# Patient Record
Sex: Female | Born: 1940
Health system: Southern US, Community
[De-identification: ages and names within clinical notes are randomized; demographics above are authoritative.]

## PROBLEM LIST (undated history)

## (undated) DIAGNOSIS — K219 Gastro-esophageal reflux disease without esophagitis: Secondary | ICD-10-CM

## (undated) DIAGNOSIS — I701 Atherosclerosis of renal artery: Secondary | ICD-10-CM

## (undated) DIAGNOSIS — E785 Hyperlipidemia, unspecified: Secondary | ICD-10-CM

## (undated) DIAGNOSIS — E119 Type 2 diabetes mellitus without complications: Secondary | ICD-10-CM

## (undated) DIAGNOSIS — Z951 Presence of aortocoronary bypass graft: Secondary | ICD-10-CM

## (undated) DIAGNOSIS — I10 Essential (primary) hypertension: Secondary | ICD-10-CM

## (undated) DIAGNOSIS — I2510 Atherosclerotic heart disease of native coronary artery without angina pectoris: Secondary | ICD-10-CM

## (undated) HISTORY — DX: Atherosclerotic heart disease of native coronary artery without angina pectoris: I25.10

## (undated) HISTORY — DX: Presence of aortocoronary bypass graft: Z95.1

## (undated) HISTORY — DX: Atherosclerosis of renal artery: I70.1

## (undated) HISTORY — PX: TOTAL KNEE ARTHROPLASTY: SHX125

## (undated) HISTORY — DX: Essential (primary) hypertension: I10

## (undated) HISTORY — DX: Type 2 diabetes mellitus without complications: E11.9

## (undated) HISTORY — DX: Hyperlipidemia, unspecified: E78.5

---

## 1997-06-18 DIAGNOSIS — Z951 Presence of aortocoronary bypass graft: Secondary | ICD-10-CM

## 1997-06-18 HISTORY — DX: Presence of aortocoronary bypass graft: Z95.1

## 1998-05-18 ENCOUNTER — Inpatient Hospital Stay (HOSPITAL_COMMUNITY): Admission: AD | Admit: 1998-05-18 | Discharge: 1998-05-24 | Payer: Self-pay | Admitting: Cardiovascular Disease

## 1998-05-18 ENCOUNTER — Encounter: Payer: Self-pay | Admitting: Cardiovascular Disease

## 1998-05-18 HISTORY — PX: CARDIAC CATHETERIZATION: SHX172

## 1998-05-19 ENCOUNTER — Encounter: Payer: Self-pay | Admitting: Cardiovascular Disease

## 1998-05-19 HISTORY — PX: CORONARY ARTERY BYPASS GRAFT: SHX141

## 1998-05-20 ENCOUNTER — Encounter: Payer: Self-pay | Admitting: Cardiovascular Disease

## 1998-05-21 ENCOUNTER — Encounter: Payer: Self-pay | Admitting: Thoracic Surgery (Cardiothoracic Vascular Surgery)

## 1998-05-26 ENCOUNTER — Encounter: Payer: Self-pay | Admitting: Thoracic Surgery (Cardiothoracic Vascular Surgery)

## 1998-05-26 ENCOUNTER — Inpatient Hospital Stay (HOSPITAL_COMMUNITY)
Admission: EM | Admit: 1998-05-26 | Discharge: 1998-05-30 | Payer: Self-pay | Admitting: Thoracic Surgery (Cardiothoracic Vascular Surgery)

## 2001-08-27 ENCOUNTER — Encounter: Payer: Self-pay | Admitting: Unknown Physician Specialty

## 2001-08-27 ENCOUNTER — Encounter: Admission: RE | Admit: 2001-08-27 | Discharge: 2001-08-27 | Payer: Self-pay | Admitting: Unknown Physician Specialty

## 2003-03-02 HISTORY — PX: CARDIAC CATHETERIZATION: SHX172

## 2004-06-05 ENCOUNTER — Ambulatory Visit: Payer: Self-pay | Admitting: Family Medicine

## 2004-09-14 ENCOUNTER — Ambulatory Visit: Payer: Self-pay | Admitting: Family Medicine

## 2004-10-23 ENCOUNTER — Ambulatory Visit: Payer: Self-pay | Admitting: Family Medicine

## 2004-12-20 ENCOUNTER — Ambulatory Visit: Payer: Self-pay | Admitting: Family Medicine

## 2005-02-05 ENCOUNTER — Ambulatory Visit: Payer: Self-pay | Admitting: Family Medicine

## 2005-02-20 ENCOUNTER — Ambulatory Visit: Payer: Self-pay | Admitting: Family Medicine

## 2005-03-15 ENCOUNTER — Encounter (INDEPENDENT_AMBULATORY_CARE_PROVIDER_SITE_OTHER): Payer: Self-pay | Admitting: Orthopaedic Surgery

## 2005-03-15 ENCOUNTER — Ambulatory Visit (HOSPITAL_COMMUNITY): Admission: RE | Admit: 2005-03-15 | Discharge: 2005-03-15 | Payer: Self-pay | Admitting: Orthopaedic Surgery

## 2005-05-15 ENCOUNTER — Ambulatory Visit: Payer: Self-pay | Admitting: Family Medicine

## 2005-08-22 ENCOUNTER — Ambulatory Visit: Payer: Self-pay | Admitting: Family Medicine

## 2005-12-18 ENCOUNTER — Ambulatory Visit: Payer: Self-pay | Admitting: Family Medicine

## 2006-01-16 ENCOUNTER — Ambulatory Visit: Payer: Self-pay | Admitting: Family Medicine

## 2006-03-18 HISTORY — PX: OTHER SURGICAL HISTORY: SHX169

## 2006-04-25 ENCOUNTER — Ambulatory Visit: Payer: Self-pay | Admitting: Family Medicine

## 2006-05-02 ENCOUNTER — Ambulatory Visit: Payer: Self-pay | Admitting: Family Medicine

## 2006-05-16 ENCOUNTER — Ambulatory Visit: Payer: Self-pay | Admitting: Family Medicine

## 2006-06-03 ENCOUNTER — Ambulatory Visit: Payer: Self-pay | Admitting: Family Medicine

## 2006-08-09 ENCOUNTER — Ambulatory Visit: Payer: Self-pay | Admitting: Family Medicine

## 2006-09-05 ENCOUNTER — Ambulatory Visit (HOSPITAL_COMMUNITY): Admission: RE | Admit: 2006-09-05 | Discharge: 2006-09-05 | Payer: Self-pay | Admitting: Ophthalmology

## 2006-09-10 ENCOUNTER — Ambulatory Visit: Payer: Self-pay | Admitting: Family Medicine

## 2006-12-12 ENCOUNTER — Ambulatory Visit (HOSPITAL_COMMUNITY): Admission: RE | Admit: 2006-12-12 | Discharge: 2006-12-12 | Payer: Self-pay | Admitting: Ophthalmology

## 2007-04-17 HISTORY — PX: CARDIAC CATHETERIZATION: SHX172

## 2007-11-06 ENCOUNTER — Encounter: Payer: Self-pay | Admitting: Orthopedic Surgery

## 2007-11-06 ENCOUNTER — Inpatient Hospital Stay (HOSPITAL_COMMUNITY): Admission: RE | Admit: 2007-11-06 | Discharge: 2007-11-10 | Payer: Self-pay | Admitting: Orthopaedic Surgery

## 2007-11-14 ENCOUNTER — Telehealth: Payer: Self-pay | Admitting: Orthopedic Surgery

## 2007-11-20 ENCOUNTER — Ambulatory Visit: Payer: Self-pay | Admitting: Orthopedic Surgery

## 2007-11-20 DIAGNOSIS — Z96659 Presence of unspecified artificial knee joint: Secondary | ICD-10-CM

## 2007-11-24 ENCOUNTER — Encounter: Payer: Self-pay | Admitting: Orthopedic Surgery

## 2009-07-14 ENCOUNTER — Ambulatory Visit (HOSPITAL_COMMUNITY): Admission: RE | Admit: 2009-07-14 | Discharge: 2009-07-14 | Payer: Self-pay | Admitting: Family Medicine

## 2010-05-12 ENCOUNTER — Ambulatory Visit (HOSPITAL_COMMUNITY): Admission: RE | Admit: 2010-05-12 | Discharge: 2010-05-12 | Payer: Self-pay | Admitting: Orthopaedic Surgery

## 2010-07-19 ENCOUNTER — Encounter: Payer: Self-pay | Admitting: Neurosurgery

## 2010-10-31 NOTE — Consult Note (Signed)
Virginia [name], Virginia [name]               ACCOUNT NO.:  [id number]   MEDICAL RECORD NO.:  [id number]          PATIENT TYPE:  INP   LOCATION:  A329                          FACILITY:  [facility]   PHYSICIAN:  Osvaldo Shipper, MD     DATE OF BIRTH:  07/14/1940   DATE OF CONSULTATION:  11/06/2007  DATE OF DISCHARGE:                                 CONSULTATION   Please note the patient's PMD is Dr. Lysbeth Galas and she is followed by  [facility].   PHYSICIAN REQUESTING CONSULTATION:  J. Darreld Mclean, M.D.   REASON FOR CONSULTATION:  Management of diabetes, hypertension.   CHIEF COMPLAINT:  Right knee pain.   HISTORY OF PRESENT ILLNESS:  The patient is a 70 year old Caucasian  female who has a past medical history of coronary artery disease status  post CABG, history of type 2 diabetes and hypertension who underwent an  elective right total knee replacement earlier today.  The patient has  been having pain in both her knees actually for many years.  She  underwent I think arthroscopic procedure in 2006 of the right knee and  she underwent a right partial medial meniscectomy as well.  However, her  symptoms did not improve and so this surgery was planned.  At this point  she denies any shortness of breath or chest pain.  She says that ever  since her CABG in 1999 she has not had any issues with her heart as  well.  So her main concern at this point is her right knee pain.   HOME MEDICATIONS:  The patient did not know the names of her medications  but from her med rec sheet it appears that she is on the following:  1. Caduet 10/30 one tablet daily.  2. Actos 45 mg daily.  3. Benazepril/HCTZ 20/12.5 daily.  4. Paroxetine 10 mg daily.  5. Metoprolol 50 mg daily.  6. Glyburide/metformin 1.25/250 daily.  7. Micardis 40 mg daily.  8. Diclofenac 75 mg daily.   ALLERGIES:  No known drug allergies.   PAST MEDICAL HISTORY:  Positive for CABG in 1999 for coronary artery  disease, history of  cataract surgery, knee surgeries, diabetes type 2,  hypertension.   SOCIAL HISTORY:  Lives in [place] with her daughter.  No smoking,  alcohol or illicit drug use.  She uses a cane and at times a walker to  ambulate.   FAMILY HISTORY:  She has a son who underwent an aortic valve replacement  for aortic stenosis.   REVIEW OF SYSTEMS:  Ten point review of systems was negative with some  limitations because the patient was groggy from her narcotics.   PHYSICAL EXAMINATION:  VITAL SIGNS:  Temperature 98.0, heart rate 83,  respiratory rate 20, blood pressure 145/71, continuous pulse oximetry  shows oximetry to be 95%.  GENERAL:  This is an obese white female complaining of pain but in no  distress.  HEENT:  There is no pallor, no icterus.  Oral mucous membranes moist.  No oral lesions are noted.  NECK:  Soft and supple.  No thyromegaly is appreciated.  LUNGS:  Clear to auscultation with few actually very few scattered  wheezes bilaterally.  CARDIOVASCULAR:  S1 and S2 normal, regular.  No murmurs appreciated.  ABDOMEN:  Abdomen is soft, nontender, nondistended.  CARDIOVASCULAR:  Cardiac exam was unremarkable.  EXTREMITIES:  Show mild edema in the left lower extremity.  The right  leg was not examined at this time.  NEUROLOGICAL:  The patient is alert but somnolent, easily arousable and  oriented x3.   LABS:  No labs done today.  Labs done on May 15 show normal white count,  hemoglobin slightly low at 11.3, rest of the labs completely  unremarkable.   ASSESSMENT:  This is a 70 year old Caucasian female who underwent right  total knee replacement.  She has a history of coronary artery disease,  hypertension, diabetes.  She appears medically stable at this time.  She  is having pain in her right knee which is being addressed with a PCA  pump.  Her medical issues appear to be stable.   PLAN:  Continue with current treatment.  Continue with the beta-blocker  as you are doing already.   It should be noted that she was seen by  [facility] prior to this surgery and was cleared.  She had  a cardiac cath I believe sometime within the last 6 months or so back in  October and it showed patent grafts.  So at this point we should  continue what we are doing without any changes.  She is getting DVT  prophylaxis.  I will also recommend incentive spirometry.  Recommend  CBGs to be checked q. a.c. and at bedtime, put her on a moderate sliding  scale.  We will also hold off on glyburide for now until her p.o. intake  is consistent to avoid hypoglycemia.   We would like to thank Dr. Hilda Lias for asking [place] to consult on this  patient.  We would be happy to follow this patient along with him while  she is hospitalized.      Osvaldo Shipper, MD  Electronically Signed     GK/MEDQ  D:  11/06/2007  T:  11/06/2007  Job:  [id number]   cc:   Delaney Meigs, M.D.  Fax: [phone]   J. Darreld Mclean, M.D.  Fax: [phone]   Nanetta Batty, M.D.  Fax: [phone]

## 2010-10-31 NOTE — Op Note (Signed)
Virginia [name], Virginia [name]               ACCOUNT NO.:  [id number]   MEDICAL RECORD NO.:  [id number]          PATIENT TYPE:  INP   LOCATION:  A329                          FACILITY:  [facility]   PHYSICIAN:  J. Darreld Mclean, M.D. DATE OF BIRTH:  04/22/1941   DATE OF PROCEDURE:  DATE OF DISCHARGE:                               OPERATIVE REPORT   PREOPERATIVE DIAGNOSIS:  Severe degenerative joint disease of the right  knee.   POSTOPERATIVE DIAGNOSIS:  Severe degenerative joint disease of the right  knee.   PROCEDURE:  Right knee arthroplasty using a Smith & Nephew #5 femoral  component, a #4 tibial component, a  #11 tibial wafer, and a 29-mm  patella button, all cemented with methyl methacrylate.   ANESTHESIA:  Spinal.   SURGEON:  Teola Bradley, MD   TOURNIQUET TIME:  87 minutes.   DRAIN:  One large drain used.   INDICATIONS:  The patient is a 70 year old female with severe  degenerative joint disease of the right knee that has gotten  progressively worse over the last several years.  She did have an  arthroscopy several years ago and did well initially, but this has  gotten to increasing pain and tenderness of her knee.  She requires  assisted device to ambulate, has pain at rest of.  Discussed with her  total knee arthroplasty several occasions.  I have gone over the risks  and imponderables of the procedure with her on multiple occasions  including infection, blood clot that can cause death, possible need for  blood transfusion, need for physical therapy postoperatively either at  home or through a nursing home, and anesthesia risk.  She has elected to  give blood, autotransfusion via the [facility] and has completed 2 units.  She was informed of the total joint classes here at the hospital.   DESCRIPTION OF PROCEDURE:  The patient was seen in the holding area and  the right knee was identified as the correct surgical site.  I placed a  mark on the right knee.  The patient brought  to the operating room and  given spinal anesthesia.  She was then placed supine on the operating  room table with a tourniquet placed and deflated right upper thigh.  A  leg positioning support was placed.  The patient then prepped and draped  in the usual manner.   I had a time-out identifying [name] as the patient, the right knee  as the correct surgical site and we are doing a total knee arthroplasty.   The patient's leg was elevated, wrapped circumferentially with an  Esmarch bandage.  Tourniquet inflated to 375 mmHg.  The Esmarch bandage  removed.  Midline incision was made.  Parapatellar incision was made.  The patella was everted.  Excess synovium and debris of the knee was  removed sharply.  The knee was flexed with the patella everted.  First,  drill hole was made and then the first jig was placed.  I elected to  take 2 mm extra off of the proximal femur and this was done and the jig  was then reapplied and held in place with pins.  Cuts were made.  The  femur was sized and it was more likely four and three-quarters so I  elected to go up to a size 5. Next, a jig block was placed and an  anterior cut was made, a posterior cut was made, a posterior chamfer cut  was made and an anterior chamfer cut was made.  This jig block was  removed.  Attention was directed to the tibia and tibia was fully  exposed and external alignment guide was used.  I elected to take 11 mm  off the high side which was the left lateral. An alignment rod was used  and was properly aligned to the second toe.  Cuts were made.  Proximal  tibia bone was removed. The distal tibia component was placed and the  spacing gap was measured and it fit very nicely at 9 but was slightly  loose, at 11 it fit very nicely.  A #5 tibial component was a little bit  and I elected to use a #4 tibial component with an 11 spacer fit  excellent, had good range of motion.  The knee was stable, did not lift  up in either AP or  lateral reflection views and we tried a 13 that was a  little tight.  I elected to use a #4 tibial component.  Attention was  directed back to the femur.  Appropriate jig was used to make the box  cut in the distal portion of the femur.  Jig was placed and then using  the drill, cuts were made and a box osteotome was used.  This completed  the cuts for the distal femur.  Trial reduction was carried out.  We  found the place where the tibia fit best and we made marks.  The femoral  component was removed.  Tibial block was placed.  Pins were made and  then the tibial drill was used and then the tibial jig and box cut were  made with the impactor.  These were then removed.  Trial reduction was  carried out very nicely.  Attention was directed to the patella and  patella cuts were made.  The patella measured 21.5 mm.  We took off 9 mm  with the patella jig and the cutting saw reciprocator.  Drill holes were  made for the patella button.  The assistant Mr. McFadden mixed the glue  while I cleaned the femur and washed it out with a Waterpik lavage  system.  The glue was then placed.  A viscous tape with a caulk gun  first trimmed the distal component, the tibia was placed and this was  impacted.  Then the femoral component was placed and impacted.  The  tibial wafer was then inserted and fit nicely to #11 and then the  patella button with methacrylate applied, it was held in place until the  glue hardened.  Hemovac drain was placed.  All the components fit  nicely.  Flexion/extension was excellent.  Knee was stable.  The wound  was reapproximated using interrupted figure-of-eight  #1 Surgilon  suture.  Hemovac was placed on with 2-0 silk.  Tourniquet was deflated  after 87 minutes.  X-rays were taken which showed excellent position and  alignment of the prosthesis on AP and lateral views.  Hemostasis was  obtained and the tourniquet have been deflated and then the wound was  reapproximated using  2-0 plain, the subcutaneous tissue  in layers and  skin staples.  Sterile dressing applied and bulky dressing applied.  Cryo cuff would be applied in recovery.  She would be placed in a CPM in  recovery.  The patient is admitted.           ______________________________  [name], M.D.     JWK/MEDQ  D:  11/06/2007  T:  11/07/2007  Job:  [id number]   cc:   Delaney Meigs, M.D.  Fax: [phone]   Hospitalist

## 2010-10-31 NOTE — H&P (Signed)
NAME:  Virginia [name], Virginia [name]               ACCOUNT NO.:  [id number]   MEDICAL RECORD NO.:  [id number]          PATIENT TYPE:  AMB   LOCATION:  DAY                           FACILITY:  [facility]   PHYSICIAN:  J. Darreld Mclean, M.D. DATE OF BIRTH:  08-Jun-1941   DATE OF ADMISSION:  DATE OF DISCHARGE:  LH                              HISTORY & PHYSICAL   CHIEF COMPLAINT:  My right knee hurts.   HISTORY OF PRESENT ILLNESS:  The patient is a 70 year old female with  pain and tenderness in her right knee for some time.  I saw her in 2006  for the first time complaining of pain and tenderness in her right knee.  An MRI showed a tear of the medial meniscus of the right knee.  She  underwent arthroscopy of the knee.  It was noted at that time she had  marked severe degenerative joint disease, particular medially.  She  tolerated the procedure well.  The patient has a history of heart  disease and is being followed by [facility].  Over the last several years, has gotten increasing pain and tenderness  to the knee on the right.  Both knees are painful both knees are tender.  Her knees got progressively worse over time, and she has not responded  well to conservative treatment.  She has recurring swelling, pain, need  of assistive device to ambulate.  She is being seen by both Dr. Lysbeth Galas,  her family doctor, and Dr. Allyson Sabal her cardiac physician, have giving  clearance for her surgery.  I have talked to her on several occasions  about a total knee arthroplasty and gone over the risks and  imponderables in detail.  The patient elected to have total knee  arthroplasty on the right.  She has given blood through the [facility].   PAST HISTORY:  1. Positive for heart disease in 1999.  In December, she had open      heart surgery.  She has done well with that over the years.  2. History of hypertension.  3. History of diabetes.  4. She denies lung disease, kidney disease, stroke,  paralysis,      weakness, TB, cancer, ulcer disease or circulatory problems.   ALLERGIES:  SHE DENIES ANY ALLERGIES.   MEDICATIONS:  1. Paroxetine 10 mg daily.  2. Caduet 10 mg daily.  3. Mobic 15 mg daily.  4. Actos 45 mg daily.  5. Toprol XL 50 mg daily.  6. Combination of glyburide/metformin 125/250 b.i.d.  7. Vitamin C.  8. Aspirin 81 mg daily.   SOCIAL HISTORY:  She does not smoke or use alcoholic beverage.   PRIMARY CARE PHYSICIAN:  Delaney Meigs, M.D.   CARDIOLOGIST:  Nanetta Batty, M.D.   FAMILY HISTORY:  History of strokes run in the family.  Mother had a  stroke.  The patient lives in [place], and she is single.   PHYSICAL EXAMINATION:  VITAL SIGNS:  Normal.  HEENT:  Negative.  NECK:  Supple.  LUNGS:  Clear to P&A.  HEART:  Regular without murmur.  ABDOMEN:  Soft, nontender without masses.  Right knee has range of  motion 0-90 degrees with pain.  She can get to full extension with some  difficulty.  She has crepitus to the knee, deformity to the knee.  Knee  is stable.  She has no distal edema.  CNS: Intact.  SKIN:  Intact.  EXTREMITIES:  Negative   IMPRESSION:  Significant severe DJD of the right knee, mild DJD of the  left knee.   PLAN:  Total knee arthroplasty on the right.   The patient also has a significant history of cardiac disease, was found  to be an acceptable candidate for this procedure.   I have gone over with her the risks and the problems of the procedure  including infection, blood clots that could cause death, need for  physical therapy, continued passive motion machine, home health therapy  and spinal anesthesia.  She understands with blood transfusion and has  already given 2 units through the [facility].  Labs are pending                                            ______________________________  J. Darreld Mclean, M.D.     JWK/MEDQ  D:  11/05/2007  T:  11/05/2007  Job:  [id number]

## 2010-11-03 NOTE — H&P (Signed)
NAME:  Virginia [name], Virginia [name]               ACCOUNT NO.:  [id number]   MEDICAL RECORD NO.:  [id number]          PATIENT TYPE:  AMB   LOCATION:                                FACILITY:  [facility]   PHYSICIAN:  J. Darreld Mclean, M.D. DATE OF BIRTH:  [birth year]   DATE OF ADMISSION:  DATE OF DISCHARGE:  LH                                HISTORY & PHYSICAL   CHIEF COMPLAINT:  My knee hurts.   The patient is a 70 year old female with pain and tenderness in her right  knee.  I first saw her in the office on July 26 with pain and tenderness in  her knee, and I thought she has a meniscal injury.  The knee had been  bothering her for several months prior to that.  She had been seen by Dr.  Lysbeth Galas.  She has had giving way, swelling, tenderness.  Dr. Lysbeth Galas asked  that I see her.  An MRI was obtained of the knee which showed a medial  meniscus bucket-handle tear with displaced fragment, possible lateral  meniscus radial tear, and changes to the cruciate ligaments.  There was  effusion and DJD.   The patient was informed of the findings.  I had given her an injection when  I first saw her, so her knee was better at that time, and she did not want  to have any surgery done.  She had a previous history of open heart surgery  and was asked to a cardiologist for evaluation. She saw her cardiologist on  August 30, Dr. Allyson Sabal, who said she had a low cardiac risk and would be  comfortable for surgery, arthroscopy.  The patient now wants to have it done  because the knee keeps giving way, is swollen and painful with tenderness,  and she is not getting any better.   The patient has a history of heart disease, history of hypertension, history  of diabetes.   MEDICATIONS:  1.  Paroxetine 10 mg daily.  2.  Caduet 10 mg daily.  3.  Mobic 15 mg daily.  4.  Actos 45 mg daily.  5.  Avalide 300/12.5 mg daily.  6.  Toprol XL 50 mg 1-1/2 daily.  7.  Glyburide and metformin 1.25/250 one twice daily.  8.  Vitamin C 1  daily.  9.  Aspirin 81 mg daily.  10. Vicodin 5/500 one every 4 hours p.r.n. pain.   ALLERGIES:  The patient denies any allergies.   She does smoke.  Does not use alcoholic beverages.  She is a Recruitment consultant.  Dr. Lysbeth Galas is her family physician.   She is status post open heart surgery December 1999.  Heart disease and  strokes run in the family.   The patient is single, lives in [place].  She works for [employer].   PHYSICAL EXAMINATION:  VITAL SIGNS:  Blood pressure 184/90, pulse 60,  respirations 20, afebrile.  She is 5 feet 3 inches, 253 pounds.  GENERAL:  Alert and cooperative.  HEENT:  Negative.  NECK:  Supple.  LUNGS:  Clear to percussion and  auscultation.  HEART:  Regular without murmur heard.  ABDOMEN:  Soft and nontender without masses.  EXTREMITIES:  Right knee is painful, tender, with positive McMurray medial  aspect. Other extremities within normal limits.  CNS:  Cranial nerves II-XII intact.   IMPRESSION:  Tear of meniscus, right knee, medial meniscus and possible tear  anterior meniscus.   PLAN:  Operative arthroscopy, partial medial meniscectomy.  Discussed with  patient planned procedure, appears to understand.  Labs are pending.                                            ______________________________  J. Darreld Mclean, M.D.     JWK/MEDQ  D:  03/07/2005  T:  03/07/2005  Job:  [id number]

## 2010-11-03 NOTE — Discharge Summary (Signed)
NAMEFRITZI, [name]               ACCOUNT NO.:  [id number]   MEDICAL RECORD NO.:  [id number]          PATIENT TYPE:  INP   LOCATION:  A329                          FACILITY:  [facility]   PHYSICIAN:  J. Darreld [place] [name], M.D. DATE OF BIRTH:  1940-06-28   DATE OF ADMISSION:  11/06/2007  DATE OF DISCHARGE:  05/25/2009LH                               DISCHARGE SUMMARY   DISCHARGE DIAGNOSIS:  Severe degenerative joint disease of the right  knee.   PROCEDURE PERFORMED:  Total knee arthroplasty on the right using a Smith  & Nephew #5, femoral component #4, tibial component #11, tibial wave  from 29 mm __________.   DISCHARGE STATUS:  Improved.   PROGNOSIS:  Good.   DISPOSITION:  Home.   The patient is scheduled to be seen in my office in 1 month.   Discharge medications are listed on the discharge medical reconciliation  form __________ reference.  I also have her on beta Tylox one every 4  hours for pain and enoxaparin 30 mg daily for 14 days.  Home health and  physical therapy to arrange this.   PHYSICAL THERAPY INSTRUCTIONS:  Weightbearing as tolerated with the  right knee, stress extension of the knee, use of a walker.  Total knee  protocol.   WOUND CARE:  Nurses to remove the staples from the knee wound  approximately 12-14 days after surgery.  Call with any difficulty or  problems with the wound.   BRIEF HISTORY:  The patient has severe degenerative joint disease of the  right knee.  Risks and imponderables of the procedure were discussed on  several occasions prior to this admission.  The patient underwent above-  mentioned procedure and tolerated it well.  She was seen in consultation  by the hospitalist, Dr. Rito Ehrlich.  Pain was controlled by the PCA pump.  First day, vital signs were stable.  Hemoglobin was 10.3, therapy was  begun.  She did well with the therapy and made very good progress.  CPM  machine was in place.  Hemoglobin fell to 8.9, and she was transfused  with  auto-transfused blood.  We had arranged with her in the [facility].  Hemovac removed on second day and transfusions were begun.  On the  following day, the hemoglobin was 9.5.  Foley was discontinued and IV  was discontinued.  She was placed on p.o. medicine, and she did well.  Hemoglobin remained stable.  Her wounds  looked good.  A OpSite dressing was applied.  On Nov 10, 2007, plans  were made for discharge to her home with home health.  Physical therapy  would be done at home as well as the nurse visits.  Other labs were  normal, EKG was negative.  I will see her as stated.  If difficulties,  contact me through the office or hospital beeper system.     ______________________________  Shela Commons. Darreld [place] [name], M.D.    ______________________________  Shela Commons. Darreld [place] [name], M.D.    JWK/MEDQ  D:  11/27/2007  T:  11/27/2007  Job:  [id number]

## 2010-11-03 NOTE — Op Note (Signed)
NAME:  Virginia [name], Virginia [name]               ACCOUNT NO.:  [id number]   MEDICAL RECORD NO.:  [id number]          PATIENT TYPE:  AMB   LOCATION:  DAY                           FACILITY:  [facility]   PHYSICIAN:  J. Darreld Mclean, M.D. DATE OF BIRTH:  08-Apr-1941   DATE OF PROCEDURE:  03/15/2005  DATE OF DISCHARGE:                                 OPERATIVE REPORT   CHIEF COMPLAINT:  Right knee pain.   PREOPERATIVE DIAGNOSES:  Tear of medial meniscus, right knee. Questionable  tear, anterior horn lateral meniscus. Degenerative joint disease.   POSTOPERATIVE DIAGNOSES:  Tear of medial meniscus, right knee. Questionable  tear, anterior horn lateral meniscus. Degenerative joint disease.   PROCEDURE:  Operative arthroscopy of the right knee with right partial  medial meniscectomy.   ANESTHESIA:  General.   TOURNIQUET TIME:  46 minutes.   SURGEON:  Dr. Hilda Lias.   INDICATIONS:  The patient is a 70 year old female with pain and tenderness  in the right knee. MRI shows tear of the medial meniscus, degenerative joint  disease, and questionable tear of the anterior horn of the lateral meniscus.  The patient has not improved with conservative treatment. Risks and  imponderables of the procedure were discussed preoperatively. The patient  appeared to understand and agreed to the procedure.   OPERATIVE FINDINGS:  Suprapatellar pouch and degenerative changes. Synovitis  and grade 3 changes to the patella. Medially, there was grade 4 changes down  to eburnated bone. Several stellate tears to the medial meniscus. Meniscus  was very loose, particularly around the mid posterior horn. There was some  fragments within the knee. Anterior cruciate was intact. Laterally, there  was some grade 2 to 3 changes, small tear of the anterior horn but not  enough to remove it, just a very small frayed area.   DESCRIPTION OF PROCEDURE:  The patient was seen in the holding area. The  right knee was identified as the correct  surgical site. She placed a mark,  and I placed a mark on the right knee. She was brought back to the operating  room and given general anesthesia while supine. Leg holder and tourniquet  placed deflated right upper thigh. The patient prepped and draped in usual  manner. Had a time out and reidentified the patient as Virginia [name] and doing  a right knee arthroscopy. Leg was elevated, wrapped circumferentially with  Esmarch bandages, tourniquet inflated to 350 mmHg, Esmarch bandage removed.  Medial inflow cannula was inserted. Lactated ringers instilled into the knee  by an infusion pump. Arthroscope inserted laterally, the knee systematically  examined, please see findings above. Permanent pictures were taken.  Attention directed to the medial side. She had several large tears in the  meniscus. The meniscus was loose. There was some fragments, removed the  fragments, and using a meniscus punch and meniscus knife and grabbers  removed a large portion of the meniscus. Then using a laser and the meniscal  shaver, I was able to get a good smooth contour. This took a while because  there was so many fragments, and it was just not  one big piece; it was a lot  of little pieces. She has eburnated bone. She has significant degenerative  joint disease medially. Laterally, the knee was examined, some fraying, but  this was consistent all the way around it, and there was no discrete tear.  The knee was irrigated with the remaining part of lactated ringers. The  wound was reapproximated using 3-0 nylon interrupted vertical mattress  sutures and some staples. Tourniquet deflated 46 minutes. Sterile dressing  applied. One percent Marcaine had been applied into each portal prior to  letting down the tourniquet. Sterile dressing applied. Bulky dressing  applied. The patient tolerated the procedure well and went to recovery in  good condition. The patient had appropriate analgesia for pain. I will see  in the  office in approximately 10 days to 2 weeks. If any difficulties, she  is to contact me through the office or hospital beeper system.           ______________________________  [name], M.D.     JWK/MEDQ  D:  03/15/2005  T:  03/15/2005  Job:  [id number]

## 2011-03-14 LAB — URINALYSIS, ROUTINE W REFLEX MICROSCOPIC
Glucose, UA: NEGATIVE
Hgb urine dipstick: NEGATIVE
Protein, ur: NEGATIVE
pH: 6.5

## 2011-03-14 LAB — BASIC METABOLIC PANEL
BUN: 7
CO2: 33 — ABNORMAL HIGH
Calcium: 8.9
Chloride: 100
Creatinine, Ser: 0.6
GFR calc Af Amer: >60
GFR calc Af Amer: >60
GFR calc non Af Amer: >60
GFR calc non Af Amer: >60
GFR calc non Af Amer: >60
Glucose, Bld: 124 — ABNORMAL HIGH
Glucose, Bld: 167 — ABNORMAL HIGH
Potassium: 3.8
Potassium: 3.9
Potassium: 4.1
Sodium: 135
Sodium: 135
Sodium: 138
Sodium: 139

## 2011-03-14 LAB — COMPREHENSIVE METABOLIC PANEL
ALT: 20
AST: 20
Albumin: 3.7
CO2: 33 — ABNORMAL HIGH
Chloride: 102
GFR calc Af Amer: >60
GFR calc non Af Amer: >60
Potassium: 4.3
Sodium: 141
Total Bilirubin: 0.8

## 2011-03-14 LAB — DIFFERENTIAL
Basophils Absolute: 0
Basophils Absolute: 0
Basophils Absolute: 0
Basophils Relative: 0
Basophils Relative: 0
Eosinophils Absolute: 0.1
Eosinophils Absolute: 0.2
Eosinophils Absolute: 0.2
Eosinophils Relative: 1
Eosinophils Relative: 1
Eosinophils Relative: 2
Eosinophils Relative: 3
Lymphocytes Relative: 17
Lymphocytes Relative: 23
Lymphocytes Relative: 9 — ABNORMAL LOW
Lymphs Abs: 0.9
Lymphs Abs: 1.5
Lymphs Abs: 1.5
Monocytes Absolute: 0.6
Monocytes Absolute: 0.7
Monocytes Absolute: 1.2 — ABNORMAL HIGH
Monocytes Absolute: 1.4 — ABNORMAL HIGH
Monocytes Relative: 7
Neutro Abs: 9.2 — ABNORMAL HIGH
Neutrophils Relative %: 84 — ABNORMAL HIGH

## 2011-03-14 LAB — CBC
HCT: 25.9 — ABNORMAL LOW
HCT: 27.5 — ABNORMAL LOW
HCT: 29.5 — ABNORMAL LOW
HCT: 30.7 — ABNORMAL LOW
HCT: 32.6 — ABNORMAL LOW
Hemoglobin: 10.3 — ABNORMAL LOW
Hemoglobin: 10.6 — ABNORMAL LOW
Hemoglobin: 11.3 — ABNORMAL LOW
Hemoglobin: 8.9 — ABNORMAL LOW
Hemoglobin: 9.5 — ABNORMAL LOW
MCHC: 34.5
MCHC: 34.5
MCHC: 34.6
MCHC: 34.8
MCHC: 34.8
MCV: 89.1
MCV: 89.4
MCV: 90.2
MCV: 90.3
MCV: 91.2
Platelets: 163
Platelets: 173
Platelets: 204
Platelets: 222
Platelets: 232
RBC: 2.87 — ABNORMAL LOW
RBC: 3.08 — ABNORMAL LOW
RBC: 3.26 — ABNORMAL LOW
RBC: 3.45 — ABNORMAL LOW
RBC: 3.57 — ABNORMAL LOW
RDW: 13
RDW: 13.1
RDW: 13.2
RDW: 13.2
RDW: 13.5
WBC: 10.9 — ABNORMAL HIGH
WBC: 6.2
WBC: 6.8
WBC: 8.4
WBC: 9.1

## 2011-03-14 LAB — CROSSMATCH: Antibody Screen: NEGATIVE

## 2011-03-14 LAB — PROTIME-INR: Prothrombin Time: 13.1

## 2011-03-14 LAB — ABO/RH: ABO/RH(D): B POS

## 2011-04-04 LAB — BASIC METABOLIC PANEL
BUN: 12
CO2: 28
Calcium: 9.2
Chloride: 103
Creatinine, Ser: 0.68
GFR calc Af Amer: >60
GFR calc non Af Amer: >60
Glucose, Bld: 142 — ABNORMAL HIGH
Potassium: 4.4
Sodium: 140

## 2011-04-04 LAB — HEMOGLOBIN AND HEMATOCRIT, BLOOD
HCT: 37
Hemoglobin: 12.8

## 2011-04-19 HISTORY — PX: NM MYOCAR PERF WALL MOTION: HXRAD629

## 2012-09-16 HISTORY — PX: OTHER SURGICAL HISTORY: SHX169

## 2012-09-25 ENCOUNTER — Other Ambulatory Visit (HOSPITAL_COMMUNITY): Payer: Self-pay | Admitting: Cardiovascular Disease

## 2012-09-25 DIAGNOSIS — I10 Essential (primary) hypertension: Secondary | ICD-10-CM

## 2012-09-29 ENCOUNTER — Ambulatory Visit (HOSPITAL_COMMUNITY)
Admission: RE | Admit: 2012-09-29 | Discharge: 2012-09-29 | Disposition: A | Discharge status: Home / Self Care | Payer: Medicare Other | Source: Ambulatory Visit | Attending: Cardiovascular Disease | Admitting: Cardiovascular Disease

## 2012-09-29 DIAGNOSIS — I10 Essential (primary) hypertension: Secondary | ICD-10-CM | POA: Insufficient documentation

## 2012-09-29 NOTE — Progress Notes (Signed)
Renal artery duplex doppler was completed. Kleber Crean RVT 

## 2012-11-07 ENCOUNTER — Telehealth: Payer: Self-pay | Admitting: *Deleted

## 2012-11-07 DIAGNOSIS — I701 Atherosclerosis of renal artery: Secondary | ICD-10-CM

## 2012-11-07 NOTE — Telephone Encounter (Signed)
Message copied by Marella Bile on Fri Nov 07, 2012 11:50 AM ------      Message from: Runell Gess      Created: Wed Oct 29, 2012  7:47 PM       Mild Right RAS. Repeat 6 mo ------

## 2012-11-07 NOTE — Telephone Encounter (Signed)
Patient was notified of renal doppler results at office visit on  10/23/12.  Ordered repeat dopplers in 6 months

## 2013-04-02 ENCOUNTER — Telehealth (HOSPITAL_COMMUNITY): Payer: Self-pay | Admitting: *Deleted

## 2013-04-03 ENCOUNTER — Encounter: Payer: Self-pay | Admitting: Cardiovascular Disease

## 2013-04-10 ENCOUNTER — Other Ambulatory Visit (HOSPITAL_COMMUNITY): Payer: Self-pay | Admitting: Cardiovascular Disease

## 2013-04-10 DIAGNOSIS — E669 Obesity, unspecified: Secondary | ICD-10-CM

## 2013-04-17 ENCOUNTER — Ambulatory Visit (HOSPITAL_COMMUNITY)
Admission: RE | Admit: 2013-04-17 | Discharge: 2013-04-17 | Disposition: A | Discharge status: Home / Self Care | Payer: Medicare Other | Source: Ambulatory Visit | Attending: Internal Medicine | Admitting: Internal Medicine

## 2013-04-17 ENCOUNTER — Ambulatory Visit (HOSPITAL_BASED_OUTPATIENT_CLINIC_OR_DEPARTMENT_OTHER)
Admission: RE | Admit: 2013-04-17 | Discharge: 2013-04-17 | Disposition: A | Discharge status: Home / Self Care | Payer: Medicare Other | Source: Ambulatory Visit | Attending: Internal Medicine | Admitting: Internal Medicine

## 2013-04-17 DIAGNOSIS — Z951 Presence of aortocoronary bypass graft: Secondary | ICD-10-CM

## 2013-04-17 DIAGNOSIS — I2510 Atherosclerotic heart disease of native coronary artery without angina pectoris: Secondary | ICD-10-CM

## 2013-04-17 DIAGNOSIS — I701 Atherosclerosis of renal artery: Secondary | ICD-10-CM | POA: Insufficient documentation

## 2013-04-17 DIAGNOSIS — E669 Obesity, unspecified: Secondary | ICD-10-CM | POA: Insufficient documentation

## 2013-04-17 MED ORDER — TECHNETIUM TC 99M SESTAMIBI GENERIC - CARDIOLITE
10.9000 | Freq: Once | INTRAVENOUS | Status: AC | PRN
  Administered 2013-04-17: 10.9 via INTRAVENOUS

## 2013-04-17 MED ORDER — TECHNETIUM TC 99M SESTAMIBI GENERIC - CARDIOLITE
31.0000 | Freq: Once | INTRAVENOUS | Status: AC | PRN
  Administered 2013-04-17: 31 via INTRAVENOUS

## 2013-04-17 MED ORDER — REGADENOSON 0.4 MG/5ML IV SOLN
0.4000 mg | Freq: Once | INTRAVENOUS | Status: AC
  Administered 2013-04-17: 0.4 mg via INTRAVENOUS

## 2013-04-17 MED ORDER — AMINOPHYLLINE 25 MG/ML IV SOLN
75.0000 mg | Freq: Once | INTRAVENOUS | Status: AC
  Administered 2013-04-17: 75 mg via INTRAVENOUS

## 2013-04-17 NOTE — Procedures (Addendum)
[facility] [address] [place] [address] [place] [address] [phone]  Cardiology Nuclear Med Study  Virginia [name] is a 72 y.o. female     MRN : [id number]     DOB: [birth year]  Procedure Date: 04/17/2013  Nuclear Med Background Indication for Stress Test:  Graft Patency History:  CAD;CABG X3--05/2008 Cardiac Risk Factors: Hypertension, Lipids and NIDDM  Symptoms:  DOE   Nuclear Pre-Procedure Caffeine/Decaff Intake:  7:00pm NPO After: 5:00am   IV Site: R Hand  IV 0.9% NS with Angio Cath:  22g  Chest Size (in):  N/A IV Started by: Emmit Pomfret, RN  Height: 5\' 4"  (1.626 m)  Cup Size: D  BMI:  Body mass index is 34.48 kg/(m^2). Weight:  201 lb (91.173 kg)   Tech Comments:  N/A    Nuclear Med Study 1 or 2 day study: 1 day  Stress Test Type:  Lexiscan  Order Authorizing Provider:  Nanetta Batty, MD   Resting Radionuclide: Technetium 75m Sestamibi  Resting Radionuclide Dose: 10.9 mCi   Stress Radionuclide:  Technetium 27m Sestamibi  Stress Radionuclide Dose: 31.0 mCi           Stress Protocol Rest HR: 55 Stress HR: 104  Rest BP: 166/89 Stress BP: 157/78  Exercise Time (min): n/a METS: n/a   Predicted Max HR: 148 bpm % Max HR: 70.27 bpm Rate Pressure Product: 41324  Dose of Adenosine (mg):  n/a Dose of Lexiscan: 0.4 mg  Dose of Atropine (mg): n/a Dose of Dobutamine: n/a mcg/kg/min (at max HR)  Stress Test Technologist: Esperanza Sheets, CCT Nuclear Technologist: Gonzella Lex, CNMT   Rest Procedure:  Myocardial perfusion imaging was performed at rest 45 minutes following the intravenous administration of Technetium 71m Sestamibi. Stress Procedure:  The patient received IV Lexiscan 0.4 mg over 15-seconds.  Technetium 39m Sestamibi injected at 30-seconds.  The patient developed SOB with a cough; 75 mg of IV Aminophylline was given with resolution.  There were no significant changes with Lexiscan.  Quantitative spect images  were obtained after a 45 minute delay.  Transient Ischemic Dilatation (Normal <1.22):  0.87 Lung/Heart Ratio (Normal <0.45):  0.25 QGS EDV:  92 ml QGS ESV:  40 ml LV Ejection Fraction: 56%     Rest ECG: NSR with non-specific ST-T wave changes  Stress ECG: No significant change from baseline ECG  QPS Raw Data Images:  Normal; no motion artifact; normal heart/lung ratio. Stress Images:  Moderate apical and basal inferolateral perfusion deficits are see Rest Images:  Comparison with the stress images reveals no significant change. Subtraction (SDS):  No evidence of ischemia. LV Wall Motion:  Normal overall LV function. Mild apical hypokinesis.  Impression Exercise Capacity:  Lexiscan with no exercise. BP Response:  Normal blood pressure response. Clinical Symptoms:  No significant symptoms noted. ECG Impression:  No significant ECG changes with Lexiscan. Comparison with Prior Nuclear Study: No significant change from previous study   Overall Impression:  Low risk stress nuclear study with apical and basal inferolateral scar, unchanged from previous study.Thurmon Fair, MD  04/17/2013 12:44 PM

## 2013-04-17 NOTE — Progress Notes (Signed)
Renal Artery Duplex Completed. °Brianna L Mazza,RVT °

## 2013-04-21 ENCOUNTER — Encounter: Payer: Self-pay | Admitting: *Deleted

## 2013-04-23 ENCOUNTER — Ambulatory Visit (INDEPENDENT_AMBULATORY_CARE_PROVIDER_SITE_OTHER): Payer: Medicare Other | Admitting: Cardiovascular Disease

## 2013-04-23 ENCOUNTER — Encounter: Payer: Self-pay | Admitting: Cardiovascular Disease

## 2013-04-23 VITALS — BP 166/82 | HR 56 | Ht 65.0 in | Wt 202.1 lb

## 2013-04-23 DIAGNOSIS — E1159 Type 2 diabetes mellitus with other circulatory complications: Secondary | ICD-10-CM | POA: Insufficient documentation

## 2013-04-23 DIAGNOSIS — I2510 Atherosclerotic heart disease of native coronary artery without angina pectoris: Secondary | ICD-10-CM

## 2013-04-23 DIAGNOSIS — E119 Type 2 diabetes mellitus without complications: Secondary | ICD-10-CM | POA: Insufficient documentation

## 2013-04-23 DIAGNOSIS — I10 Essential (primary) hypertension: Secondary | ICD-10-CM

## 2013-04-23 DIAGNOSIS — E785 Hyperlipidemia, unspecified: Secondary | ICD-10-CM

## 2013-04-23 NOTE — Patient Instructions (Signed)
Your physician wants you to follow-up in: 6 months with an extender and 1 year with Dr Berry. You will receive a reminder letter in the mail two months in advance. If you don't receive a letter, please call our office to schedule the follow-up appointment.  

## 2013-04-23 NOTE — Assessment & Plan Note (Signed)
On antihypertensive medications with borderline controlled blood pressure today though ordinarily it is much lower than this.

## 2013-04-23 NOTE — Assessment & Plan Note (Signed)
Status post coronary artery bypass grafting x3 in 1999. She had a recent Lexiscan  Myoview stress test that was nonischemic and low risk. She denies chest pain or shortness of breath.

## 2013-04-23 NOTE — Assessment & Plan Note (Signed)
On statin drug called by her PCP

## 2013-04-23 NOTE — Progress Notes (Signed)
04/23/2013 Virginia [name]   May 28, 1941  [id number]  Primary Physician No primary provider on file. Primary Cardiologist: Runell Gess MD [name]   HPI:  The patient is a 72 year old mildly to moderately overweight divorced Caucasian female, mother of 3 children, grandmother to 4 grandchildren, who I last saw in the office 4 months ago. She has a history of coronary artery bypass grafting x3 in 1999. Her other problems include hypertension, hyperlipidemia, insulin-dependent diabetes. She has had bilateral knee replacements by Dr. Hilda Lias in the past. She denies chest pain or shortness of breath. Her last functional study performed in November 2012 was nonischemic, and her lab work done by Dr. Lysbeth Galas back in January was acceptable for secondary prevention. She had a renal ultrasound to rule out renovascular hypertension, which showed moderate right renal artery stenosis. Since I saw her 6 months ago he we repeated a renal Doppler study which was unchanged. Her pharmacologic Myoview stress test was low risk. She is completely asymptomatic..      Current Outpatient Prescriptions  Medication Sig Dispense Refill  . amLODipine (NORVASC) 5 MG tablet Take 5 mg by mouth daily.      . Ascorbic Acid (VITAMIN C) 1000 MG tablet Take 1,000 mg by mouth daily.      Marland Kitchen aspirin 325 MG tablet Take 325 mg by mouth daily.      Marland Kitchen glyBURIDE-metformin (GLUCOVANCE) 1.25-250 MG per tablet Take 1 tablet by mouth 2 (two) times daily.      . hydrochlorothiazide (HYDRODIURIL) 25 MG tablet Take 25 mg by mouth daily.      . lansoprazole (PREVACID) 15 MG capsule Take 15 mg by mouth daily at 12 noon.      Marland Kitchen losartan-hydrochlorothiazide (HYZAAR) 100-12.5 MG per tablet Take 1 tablet by mouth daily.      . metoprolol succinate (TOPROL-XL) 50 MG 24 hr tablet Take 50 mg by mouth daily. Takes one and one half daily.Take with or immediately following a meal.      . naproxen (NAPROSYN) 500 MG tablet Take 500 mg  by mouth 2 (two) times daily with a meal.      . PARoxetine (PAXIL) 10 MG tablet Take 10 mg by mouth daily.      . rosuvastatin (CRESTOR) 20 MG tablet Take 20 mg by mouth daily.       No current facility-administered medications for this visit.    No Known Allergies  History   Social History  . Marital Status: Divorced    Spouse Name: N/A    Number of Children: N/A  . Years of Education: N/A   Occupational History  . Not on file.   Social History Main Topics  . Smoking status: Never Smoker   . Smokeless tobacco: Never Used  . Alcohol Use: No  . Drug Use: No  . Sexual Activity: Not on file   Other Topics Concern  . Not on file   Social History Narrative  . No narrative on file     Review of Systems: General: negative for chills, fever, night sweats or weight changes.  Cardiovascular: negative for chest pain, dyspnea on exertion, edema, orthopnea, palpitations, paroxysmal nocturnal dyspnea or shortness of breath Dermatological: negative for rash Respiratory: negative for cough or wheezing Urologic: negative for hematuria Abdominal: negative for nausea, vomiting, diarrhea, bright red blood per rectum, melena, or hematemesis Neurologic: negative for visual changes, syncope, or dizziness All other systems reviewed and are otherwise negative except as noted above.  Blood pressure 166/82, pulse 56, height 5\' 5"  (1.651 m), weight 202 lb 1.6 oz (91.672 kg).  General appearance: alert and no distress Neck: no adenopathy, no carotid bruit, no JVD, supple, symmetrical, trachea midline and thyroid not enlarged, symmetric, no tenderness/mass/nodules Lungs: clear to auscultation bilaterally Heart: regular rate and rhythm, S1, S2 normal, no murmur, click, rub or gallop Extremities: extremities normal, atraumatic, no cyanosis or edema  EKG not performed today  ASSESSMENT AND PLAN:   Hyperlipidemia On statin drug called by her PCP  Essential hypertension On  antihypertensive medications with borderline controlled blood pressure today though ordinarily it is much lower than this.  Coronary artery disease Status post coronary artery bypass grafting x3 in 1999. She had a recent Lexiscan  Myoview stress test that was nonischemic and low risk. She denies chest pain or shortness of breath.      Runell Gess MD FACP,FACC,FAHA, [facility] 04/23/2013 10:21 AM

## 2013-09-07 ENCOUNTER — Other Ambulatory Visit: Payer: Self-pay | Admitting: *Deleted

## 2013-09-07 ENCOUNTER — Encounter: Payer: Self-pay | Admitting: Cardiovascular Disease

## 2013-09-07 DIAGNOSIS — I701 Atherosclerosis of renal artery: Secondary | ICD-10-CM

## 2013-10-15 ENCOUNTER — Ambulatory Visit (HOSPITAL_COMMUNITY)
Admission: RE | Admit: 2013-10-15 | Discharge: 2013-10-15 | Disposition: A | Discharge status: Home / Self Care | Payer: Medicare Other | Source: Ambulatory Visit | Attending: Internal Medicine | Admitting: Internal Medicine

## 2013-10-15 DIAGNOSIS — I701 Atherosclerosis of renal artery: Secondary | ICD-10-CM

## 2013-10-15 NOTE — Progress Notes (Signed)
Renal Artery Duplex Completed. °Brianna L Mazza,RVT °

## 2013-10-19 ENCOUNTER — Encounter: Payer: Self-pay | Admitting: *Deleted

## 2013-10-20 ENCOUNTER — Encounter: Payer: Self-pay | Admitting: *Deleted

## 2013-10-21 ENCOUNTER — Encounter: Payer: Self-pay | Admitting: Cardiovascular Disease

## 2013-10-23 ENCOUNTER — Ambulatory Visit (INDEPENDENT_AMBULATORY_CARE_PROVIDER_SITE_OTHER): Payer: Medicare Other | Admitting: Cardiovascular Disease

## 2013-10-23 ENCOUNTER — Encounter: Payer: Self-pay | Admitting: Cardiovascular Disease

## 2013-10-23 VITALS — BP 142/82 | HR 51 | Ht 65.0 in | Wt 203.0 lb

## 2013-10-23 DIAGNOSIS — I2510 Atherosclerotic heart disease of native coronary artery without angina pectoris: Secondary | ICD-10-CM

## 2013-10-23 DIAGNOSIS — E785 Hyperlipidemia, unspecified: Secondary | ICD-10-CM

## 2013-10-23 DIAGNOSIS — I10 Essential (primary) hypertension: Secondary | ICD-10-CM

## 2013-10-23 NOTE — Patient Instructions (Signed)
  We will see you back in follow up in 6 months with an extender and 1 year with Dr Berry.  Dr Berry has ordered a Lexiscan Myoview- this is a test that looks at the blood flow to your heart muscle.  It takes approximately 2 1/2 hours. Please follow instruction sheet, as given.      

## 2013-10-23 NOTE — Progress Notes (Signed)
10/23/2013 Virginia RosalesBillie C Wakeley   1940/09/14  [id number]  Primary Physician Josue HectorNYLAND,LEONARD ROBERT, MD Primary Cardiologist: Runell GessJonathan J. Dejanique Ruehl MD Roseanne RenoFACP,FACC,FAHA, FSCAI   HPI:  The patient is a 73 year old mildly to moderately overweight divorced Caucasian female, mother of 3 children, grandmother to 4 grandchildren, who I last saw in the office 6 months ago. She has a history of coronary artery bypass grafting x3 in 1999. Her other problems include hypertension, hyperlipidemia, insulin-dependent diabetes. She has had bilateral knee replacements by Dr. Hilda LiasKeeling in the past. She denies chest pain or shortness of breath. Her last functional study performed in November 2012 was nonischemic, and her lab work done by Dr. Lysbeth GalasNyland back in January was acceptable for secondary prevention. She had a renal ultrasound to rule out renovascular hypertension, which showed moderate right renal artery stenosis. Since I saw her in the office 6 months ago she's remained completely asymptomatic.    Current Outpatient Prescriptions  Medication Sig Dispense Refill  . amLODipine (NORVASC) 5 MG tablet Take 5 mg by mouth daily.      . Ascorbic Acid (VITAMIN C) 1000 MG tablet Take 1,000 mg by mouth daily.      Marland Kitchen. aspirin 325 MG tablet Take 325 mg by mouth daily.      Marland Kitchen. glyBURIDE-metformin (GLUCOVANCE) 1.25-250 MG per tablet Take 1 tablet by mouth 2 (two) times daily.      . hydrochlorothiazide (HYDRODIURIL) 25 MG tablet Take 25 mg by mouth daily.      . lansoprazole (PREVACID) 15 MG capsule Take 15 mg by mouth daily at 12 noon.      Marland Kitchen. losartan-hydrochlorothiazide (HYZAAR) 100-12.5 MG per tablet Take 1 tablet by mouth daily.      . metoprolol succinate (TOPROL-XL) 50 MG 24 hr tablet Take 50 mg by mouth daily. Takes one and one half daily.Take with or immediately following a meal.      . naproxen (NAPROSYN) 500 MG tablet Take 500 mg by mouth 2 (two) times daily with a meal.      . PARoxetine (PAXIL) 10 MG tablet Take 10 mg by  mouth daily.      . rosuvastatin (CRESTOR) 20 MG tablet Take 20 mg by mouth daily.       No current facility-administered medications for this visit.    No Known Allergies  History   Social History  . Marital Status: Divorced    Spouse Name: N/A    Number of Children: N/A  . Years of Education: N/A   Occupational History  . Not on file.   Social History Main Topics  . Smoking status: Never Smoker   . Smokeless tobacco: Never Used  . Alcohol Use: No  . Drug Use: No  . Sexual Activity: Not on file   Other Topics Concern  . Not on file   Social History Narrative  . No narrative on file     Review of Systems: General: negative for chills, fever, night sweats or weight changes.  Cardiovascular: negative for chest pain, dyspnea on exertion, edema, orthopnea, palpitations, paroxysmal nocturnal dyspnea or shortness of breath Dermatological: negative for rash Respiratory: negative for cough or wheezing Urologic: negative for hematuria Abdominal: negative for nausea, vomiting, diarrhea, bright red blood per rectum, melena, or hematemesis Neurologic: negative for visual changes, syncope, or dizziness All other systems reviewed and are otherwise negative except as noted above.    Blood pressure 142/82, pulse 51, height 5\' 5"  (1.651 m), weight 203 lb (92.08 kg).  General appearance: alert and no distress Neck: no adenopathy, no carotid bruit, no JVD, supple, symmetrical, trachea midline and thyroid not enlarged, symmetric, no tenderness/mass/nodules Lungs: clear to auscultation bilaterally Heart: regular rate and rhythm, S1, S2 normal, no murmur, click, rub or gallop Extremities: extremities normal, atraumatic, no cyanosis or edema  EKG sinus bradycardia at 51 with nonspecific ST and T-wave changes and occasional PVCs.  ASSESSMENT AND PLAN:   Coronary artery disease Status post coronary artery bypass grafting x3 in 1999. Her last Myoview stress test was performed November  2000 vulva with nonischemic. Since she is a diabetic 73 year old bypass grafts and a stress test at close to 73 years old and in 2 recheck a pharmacologic Myoview stress test  Essential hypertension Controlled on current medications  Hyperlipidemia On statin therapy followed by her PCP      Runell GessJonathan J. Rome Schlauch MD [facility] CharlesFACP,FACC,FAHA, [facility] Hospital[facility]SCAI 10/23/2013 11:14 AM

## 2013-10-23 NOTE — Assessment & Plan Note (Signed)
Controlled on current medications 

## 2013-10-23 NOTE — Assessment & Plan Note (Signed)
On statin therapy followed by her PCP 

## 2013-10-23 NOTE — Assessment & Plan Note (Signed)
Status post coronary artery bypass grafting x3 in 1999. Her last Myoview stress test was performed November 2000 vulva with nonischemic. Since she is a diabetic 73 year old bypass grafts and a stress test at close to 73 years old and in 2 recheck a pharmacologic Myoview stress test

## 2013-10-30 ENCOUNTER — Telehealth (HOSPITAL_COMMUNITY): Payer: Self-pay

## 2013-11-05 ENCOUNTER — Inpatient Hospital Stay (HOSPITAL_COMMUNITY): Admission: RE | Admit: 2013-11-05 | Payer: Medicare Other | Source: Ambulatory Visit

## 2013-11-11 ENCOUNTER — Telehealth (HOSPITAL_COMMUNITY): Payer: Self-pay | Admitting: *Deleted

## 2013-11-26 ENCOUNTER — Telehealth (HOSPITAL_COMMUNITY): Payer: Self-pay

## 2013-12-01 ENCOUNTER — Ambulatory Visit (HOSPITAL_COMMUNITY)
Admission: RE | Admit: 2013-12-01 | Discharge: 2013-12-01 | Disposition: A | Discharge status: Home / Self Care | Payer: Medicare Other | Source: Ambulatory Visit | Attending: Cardiology | Admitting: Cardiology

## 2013-12-01 DIAGNOSIS — I252 Old myocardial infarction: Secondary | ICD-10-CM | POA: Insufficient documentation

## 2013-12-01 DIAGNOSIS — I10 Essential (primary) hypertension: Secondary | ICD-10-CM | POA: Insufficient documentation

## 2013-12-01 DIAGNOSIS — Z8249 Family history of ischemic heart disease and other diseases of the circulatory system: Secondary | ICD-10-CM | POA: Insufficient documentation

## 2013-12-01 DIAGNOSIS — I739 Peripheral vascular disease, unspecified: Secondary | ICD-10-CM | POA: Insufficient documentation

## 2013-12-01 DIAGNOSIS — E669 Obesity, unspecified: Secondary | ICD-10-CM | POA: Insufficient documentation

## 2013-12-01 DIAGNOSIS — Z951 Presence of aortocoronary bypass graft: Secondary | ICD-10-CM | POA: Insufficient documentation

## 2013-12-01 DIAGNOSIS — I2510 Atherosclerotic heart disease of native coronary artery without angina pectoris: Secondary | ICD-10-CM

## 2013-12-01 DIAGNOSIS — E119 Type 2 diabetes mellitus without complications: Secondary | ICD-10-CM | POA: Insufficient documentation

## 2013-12-01 MED ORDER — TECHNETIUM TC 99M SESTAMIBI GENERIC - CARDIOLITE
30.4000 | Freq: Once | INTRAVENOUS | Status: AC | PRN
  Administered 2013-12-01: 30 via INTRAVENOUS

## 2013-12-01 MED ORDER — TECHNETIUM TC 99M SESTAMIBI GENERIC - CARDIOLITE
10.9000 | Freq: Once | INTRAVENOUS | Status: AC | PRN
  Administered 2013-12-01: 10.9 via INTRAVENOUS

## 2013-12-01 MED ORDER — REGADENOSON 0.4 MG/5ML IV SOLN
0.4000 mg | Freq: Once | INTRAVENOUS | Status: AC
  Administered 2013-12-01: 0.4 mg via INTRAVENOUS

## 2013-12-01 NOTE — Procedures (Addendum)
[facility] [address] [place] [address] [phone]  Cardiology Nuclear Med Study  Virginia RosalesBillie C [name] is a 73 y.o. female     MRN : [id number]     DOB: 15-Jul-1940  Procedure Date: 12/01/2013  Nuclear Med Background Indication for Stress Test:  Graft Patency History:  CAD;MI;CABG X3-05/1998;Last NUC MPI on 04/17/2013;EF=56%. Cardiac Risk Factors: Family History - CAD, Hypertension, Lipids, NIDDM, Obesity and PVD  Symptoms:  asymptomatic   Nuclear Pre-Procedure Caffeine/Decaff Intake:  1:00am NPO After: 11am   IV Site: R Forearm  IV 0.9% NS with Angio Cath:  22g  Chest Size (in):  n/a IV Started by: Berdie OgrenAmanda Wease, RN  Height: 5\' 5"  (1.651 m)  Cup Size: D  BMI:  Body mass index is 33.78 kg/(m^2). Weight:  203 lb (92.08 kg)   Tech Comments:  n/a    Nuclear Med Study 1 or 2 day study: 1 day  Stress Test Type:  Lexiscan  Order Authorizing Provider:  Nanetta BattyJonathan Berry, MD   Resting Radionuclide: Technetium 3345m Sestamibi  Resting Radionuclide Dose: 10.9 mCi   Stress Radionuclide:  Technetium 2645m Sestamibi  Stress Radionuclide Dose: 30.4 mCi           Stress Protocol Rest HR: 58 Stress HR: 68  Rest BP: 152/71 Stress BP:156/64  Exercise Time (min): n/a METS: n/a          Dose of Adenosine (mg):  n/a Dose of Lexiscan: 0.4 mg  Dose of Atropine (mg): n/a Dose of Dobutamine: n/a mcg/kg/min (at max HR)  Stress Test Technologist: Ernestene MentionGwen Farrington, CCT Nuclear Technologist: Gonzella LexPam Phillips, CNMT   Rest Procedure:  Myocardial perfusion imaging was performed at rest 45 minutes following the intravenous administration of Technetium 9145m Sestamibi. Stress Procedure:  The patient received IV Lexiscan 0.4 mg over 15-seconds.  Technetium 2545m Sestamibi injected IV at 30-seconds.  There were no significant changes with Lexiscan.  Quantitative spect images were obtained after a 45 minute delay.  Transient Ischemic Dilatation (Normal <1.22):   1.03  QGS EDV:  n/a ml QGS ESV:  n/a ml LV Ejection Fraction: Study not gated        Rest ECG: NSR - Normal EKG  Stress ECG: No significant change from baseline ECG  QPS Raw Data Images:  Normal; no motion artifact; normal heart/lung ratio. Stress Images:  Normal homogeneous uptake in all areas of the myocardium. Rest Images:  Normal homogeneous uptake in all areas of the myocardium. Subtraction (SDS):  No evidence of ischemia.  Impression Exercise Capacity:  Lexiscan with no exercise. BP Response:  Normal blood pressure response. Clinical Symptoms:  No significant symptoms noted. ECG Impression:  No significant ST segment change suggestive of ischemia. Comparison with Prior Nuclear Study: No significant change from previous study  Overall Impression:  Normal stress nuclear study.  LV Wall Motion:  Gating not pergormed secondary to ectopy   Runell GessBERRY,JONATHAN J, MD  12/01/2013 6:06 PM

## 2013-12-02 ENCOUNTER — Encounter: Payer: Self-pay | Admitting: *Deleted

## 2013-12-17 NOTE — Telephone Encounter (Signed)
Encounter complete. 

## 2013-12-31 NOTE — Telephone Encounter (Signed)
Encounter complete. 

## 2014-01-07 DIAGNOSIS — I152 Hypertension secondary to endocrine disorders: Secondary | ICD-10-CM | POA: Insufficient documentation

## 2014-01-07 DIAGNOSIS — E1159 Type 2 diabetes mellitus with other circulatory complications: Secondary | ICD-10-CM | POA: Insufficient documentation

## 2014-01-07 DIAGNOSIS — K219 Gastro-esophageal reflux disease without esophagitis: Secondary | ICD-10-CM | POA: Insufficient documentation

## 2014-09-20 ENCOUNTER — Telehealth (HOSPITAL_COMMUNITY): Payer: Self-pay | Admitting: *Deleted

## 2014-10-12 ENCOUNTER — Other Ambulatory Visit (HOSPITAL_COMMUNITY): Payer: Self-pay | Admitting: Cardiovascular Disease

## 2014-10-12 DIAGNOSIS — I701 Atherosclerosis of renal artery: Secondary | ICD-10-CM

## 2014-10-15 ENCOUNTER — Ambulatory Visit (HOSPITAL_COMMUNITY)
Admission: RE | Admit: 2014-10-15 | Discharge: 2014-10-15 | Disposition: A | Discharge status: Home / Self Care | Payer: Medicare Other | Source: Ambulatory Visit | Attending: Internal Medicine | Admitting: Internal Medicine

## 2014-10-15 DIAGNOSIS — I701 Atherosclerosis of renal artery: Secondary | ICD-10-CM | POA: Diagnosis present

## 2014-10-15 NOTE — Progress Notes (Signed)
Renal artery duplex completed.  °Brianna L Mazza,RVT °

## 2014-11-10 ENCOUNTER — Encounter: Payer: Self-pay | Admitting: Cardiovascular Disease

## 2014-11-10 ENCOUNTER — Ambulatory Visit (INDEPENDENT_AMBULATORY_CARE_PROVIDER_SITE_OTHER): Payer: Medicare Other | Admitting: Cardiovascular Disease

## 2014-11-10 VITALS — BP 132/68 | HR 62 | Ht 65.0 in | Wt 209.6 lb

## 2014-11-10 DIAGNOSIS — I2510 Atherosclerotic heart disease of native coronary artery without angina pectoris: Secondary | ICD-10-CM | POA: Diagnosis not present

## 2014-11-10 DIAGNOSIS — I701 Atherosclerosis of renal artery: Secondary | ICD-10-CM | POA: Diagnosis not present

## 2014-11-10 DIAGNOSIS — I2583 Coronary atherosclerosis due to lipid rich plaque: Secondary | ICD-10-CM

## 2014-11-10 DIAGNOSIS — E785 Hyperlipidemia, unspecified: Secondary | ICD-10-CM

## 2014-11-10 DIAGNOSIS — I10 Essential (primary) hypertension: Secondary | ICD-10-CM | POA: Diagnosis not present

## 2014-11-10 NOTE — Progress Notes (Signed)
11/10/2014 Virginia [name]   11/17/1940  [id number]  Primary Physician Josue Hector, MD Primary Cardiologist: Runell Gess MD [name]   HPI:   The patient is a 74 year old mildly to moderately overweight divorced Caucasian female, mother of 3 children, grandmother to 4 grandchildren, who I last saw in the office 12 months ago. She has a history of coronary artery bypass grafting x3 in 1999. Her other problems include hypertension, hyperlipidemia, insulin-dependent diabetes. She has had bilateral knee replacements by Dr. Hilda Lias in the past. She denies chest pain or shortness of breath. Her last functional study performed in November 2012 was nonischemic. She had a renal ultrasound to rule out renovascular hypertension, which showed moderate right renal artery stenosis. Since I saw her in the office 12 months ago she's remained completely asymptomatic.recent lab work performed by Dr. Lysbeth Galas 10/08/14  revealed a total cholesterol of 124, LDL of 43 and HDL of 62   Current Outpatient Prescriptions  Medication Sig Dispense Refill  . amLODipine (NORVASC) 5 MG tablet Take 5 mg by mouth daily.    . Ascorbic Acid (VITAMIN C) 1000 MG tablet Take 1,000 mg by mouth daily.    Marland Kitchen aspirin 325 MG tablet Take 325 mg by mouth daily.    Marland Kitchen glyBURIDE-metformin (GLUCOVANCE) 1.25-250 MG per tablet Take 1 tablet by mouth 2 (two) times daily.    . hydrochlorothiazide (HYDRODIURIL) 25 MG tablet Take 25 mg by mouth daily.    . lansoprazole (PREVACID) 15 MG capsule Take 15 mg by mouth daily at 12 noon.    Marland Kitchen losartan-hydrochlorothiazide (HYZAAR) 100-25 MG per tablet Take 1 tablet by mouth daily.    . metoprolol succinate (TOPROL-XL) 50 MG 24 hr tablet Take 50 mg by mouth daily. Takes one and one half daily.Take with or immediately following a meal.    . naproxen (NAPROSYN) 500 MG tablet Take 500 mg by mouth 2 (two) times daily with a meal.    . PARoxetine (PAXIL) 10 MG tablet Take 10 mg by  mouth daily.    . rosuvastatin (CRESTOR) 20 MG tablet Take 20 mg by mouth daily.     No current facility-administered medications for this visit.    No Known Allergies  History   Social History  . Marital Status: Divorced    Spouse Name: N/A  . Number of Children: N/A  . Years of Education: N/A   Occupational History  . Not on file.   Social History Main Topics  . Smoking status: Never Smoker   . Smokeless tobacco: Never Used  . Alcohol Use: No  . Drug Use: No  . Sexual Activity: Not on file   Other Topics Concern  . Not on file   Social History Narrative     Review of Systems: General: negative for chills, fever, night sweats or weight changes.  Cardiovascular: negative for chest pain, dyspnea on exertion, edema, orthopnea, palpitations, paroxysmal nocturnal dyspnea or shortness of breath Dermatological: negative for rash Respiratory: negative for cough or wheezing Urologic: negative for hematuria Abdominal: negative for nausea, vomiting, diarrhea, bright red blood per rectum, melena, or hematemesis Neurologic: negative for visual changes, syncope, or dizziness All other systems reviewed and are otherwise negative except as noted above.    Blood pressure 132/68, pulse 62, height  (1.651 m), weight 209 lb 9.6 oz (95.074 kg).  General appearance: alert and no distress Neck: no adenopathy, no carotid bruit, no JVD, supple, symmetrical, trachea midline and thyroid not enlarged,  symmetric, no tenderness/mass/nodules Lungs: clear to auscultation bilaterally Heart: regular rate and rhythm, S1, S2 normal, no murmur, click, rub or gallop Extremities: extremities normal, atraumatic, no cyanosis or edema  EKG normal sinus rhythm at 62 with nonspecific ST-T wave changes and occasional bigeminal PVCs. I personally reviewed this EKG  ASSESSMENT AND PLAN:   Hyperlipidemia History of hyperlipidemia on Crestor 20 mg a day with recent lipid profile performed 10/06/14  revealing a total cholesterol 124, LDL 43 and HDL of 62.   Essential hypertension History of hypertension with blood pressure measured 132/68. She is on amlodipine, hydrochlorothiazide, losartan and metoprolol. Continue current meds at current dosing   Coronary artery disease History of coronary artery disease status post coronary artery bypass grafting with 3 grafts placed in 1999. Her last Myoview performed in November 2012 was nonischemic. She denies chest pain or shortness of breath   Left renal artery stenosis History of left renal artery stenosis which we have been following her defects ultrasound most recently checked 10/15/14 revealing no change.       Runell GessJonathan J. Tadarrius Burch MD FACP,FACC,FAHA, [facility] 11/10/2014 10:18 AM

## 2014-11-10 NOTE — Assessment & Plan Note (Signed)
History of coronary artery disease status post coronary artery bypass grafting with 3 grafts placed in 1999. Her last Myoview performed in November 2012 was nonischemic. She denies chest pain or shortness of breath

## 2014-11-10 NOTE — Assessment & Plan Note (Addendum)
History of hypertension with blood pressure measured 132/68. She is on amlodipine, hydrochlorothiazide, losartan and metoprolol. Continue current meds at current dosing

## 2014-11-10 NOTE — Patient Instructions (Signed)
Dr Berry recommends that you schedule a follow-up appointment in 1 year. You will receive a reminder letter in the mail two months in advance. If you don't receive a letter, please call our office to schedule the follow-up appointment. 

## 2014-11-10 NOTE — Assessment & Plan Note (Signed)
History of hyperlipidemia on Crestor 20 mg a day with recent lipid profile performed 10/06/14 revealing a total cholesterol 124, LDL 43 and HDL of 62.

## 2014-11-10 NOTE — Assessment & Plan Note (Signed)
History of left renal artery stenosis which we have been following her defects ultrasound most recently checked 10/15/14 revealing no change.

## 2015-09-20 ENCOUNTER — Telehealth: Payer: Self-pay | Admitting: Orthopaedic Surgery

## 2015-09-20 NOTE — Telephone Encounter (Signed)
Rx done. 

## 2015-11-02 ENCOUNTER — Other Ambulatory Visit: Payer: Self-pay | Admitting: Cardiovascular Disease

## 2015-11-02 DIAGNOSIS — I701 Atherosclerosis of renal artery: Secondary | ICD-10-CM

## 2015-11-25 ENCOUNTER — Ambulatory Visit (INDEPENDENT_AMBULATORY_CARE_PROVIDER_SITE_OTHER): Payer: Medicare Other | Admitting: Cardiovascular Disease

## 2015-11-25 ENCOUNTER — Ambulatory Visit (HOSPITAL_COMMUNITY)
Admission: RE | Admit: 2015-11-25 | Discharge: 2015-11-25 | Disposition: A | Discharge status: Home / Self Care | Payer: Medicare Other | Source: Ambulatory Visit | Attending: Cardiology | Admitting: Cardiology

## 2015-11-25 ENCOUNTER — Encounter: Payer: Self-pay | Admitting: Cardiovascular Disease

## 2015-11-25 VITALS — BP 152/82 | HR 46 | Ht 64.0 in | Wt 198.8 lb

## 2015-11-25 DIAGNOSIS — I701 Atherosclerosis of renal artery: Secondary | ICD-10-CM | POA: Diagnosis present

## 2015-11-25 DIAGNOSIS — I2510 Atherosclerotic heart disease of native coronary artery without angina pectoris: Secondary | ICD-10-CM

## 2015-11-25 DIAGNOSIS — E785 Hyperlipidemia, unspecified: Secondary | ICD-10-CM | POA: Diagnosis not present

## 2015-11-25 DIAGNOSIS — I10 Essential (primary) hypertension: Secondary | ICD-10-CM

## 2015-11-25 DIAGNOSIS — I700 Atherosclerosis of aorta: Secondary | ICD-10-CM | POA: Insufficient documentation

## 2015-11-25 DIAGNOSIS — I2583 Coronary atherosclerosis due to lipid rich plaque: Principal | ICD-10-CM

## 2015-11-25 DIAGNOSIS — I708 Atherosclerosis of other arteries: Secondary | ICD-10-CM | POA: Diagnosis not present

## 2015-11-25 DIAGNOSIS — E119 Type 2 diabetes mellitus without complications: Secondary | ICD-10-CM | POA: Insufficient documentation

## 2015-11-25 NOTE — Assessment & Plan Note (Signed)
history of hypertensive blood pressure measures 152/82. She is on amlodipine, hydrated diarrheal, losartan and metoprolol. Continue current meds at current dosing

## 2015-11-25 NOTE — Assessment & Plan Note (Signed)
History of CAD status post coronary artery bypass grafting X 3 in 1999. Her last functional study performed in November 2012 was nonischemic. She denies chest pain or shortness of breath.

## 2015-11-25 NOTE — Assessment & Plan Note (Signed)
History of hyperlipidemia on statin therapy followed by her PCP. 

## 2015-11-25 NOTE — Progress Notes (Signed)
11/25/2015 Virginia RosalesBillie C [name]   Jul 10, 1940  [id number]  Primary Physician Virginia HectorNYLAND,LEONARD ROBERT, [name] Primary Cardiologist: Virginia GessJonathan J Lenette Rau [name] Virginia RenoFACP, FACC, FAHA, [name]  HPI:  The patient is a 75 year old mildly to moderately overweight divorced Caucasian female, mother of 3 children, grandmother to 4 grandchildren, who I last saw in the office 11/10/14 She has a history of coronary artery bypass grafting x3 in 1999. Her other problems include hypertension, hyperlipidemia, insulin-dependent diabetes. She has had bilateral knee replacements by Dr. Hilda [name] in the past. She denies chest pain or shortness of breath. Her last functional study performed in November 2012 was nonischemic. She had a renal ultrasound to rule out renovascular hypertension, which showed moderate right renal artery stenosis. Since I saw her in the office 12 months ago she's remained completely asymptomatic.   Current Outpatient Prescriptions  Medication Sig Dispense Refill  . amLODipine (NORVASC) 5 MG tablet Take 5 mg by mouth daily.    . Ascorbic Acid (VITAMIN C) 1000 MG tablet Take 1,000 mg by mouth daily.    Marland Kitchen. aspirin 325 MG tablet Take 325 mg by mouth daily.    Marland Kitchen. glyBURIDE-metformin (GLUCOVANCE) 1.25-250 MG per tablet Take 1 tablet by mouth 2 (two) times daily.    . hydrochlorothiazide (HYDRODIURIL) 25 MG tablet Take 25 mg by mouth daily.    . lansoprazole (PREVACID) 15 MG capsule Take 15 mg by mouth daily at 12 noon.    Marland Kitchen. losartan-hydrochlorothiazide (HYZAAR) 100-25 MG per tablet Take 1 tablet by mouth daily.    . metoprolol succinate (TOPROL-XL) 50 MG 24 hr tablet Take 50 mg by mouth daily. Takes one and one half daily.Take with or immediately following a meal.    . naproxen (NAPROSYN) 500 MG tablet TAKE ONE TABLET TWICE A DAY WITH FOOD 60 tablet 5  . PARoxetine (PAXIL) 10 MG tablet Take 10 mg by mouth daily.    . rosuvastatin (CRESTOR) 20 MG tablet Take 20 mg by mouth daily.     No current facility-administered  medications for this visit.    No Known Allergies  Social History   Social History  . Marital Status: Divorced    Spouse Name: N/A  . Number of Children: N/A  . Years of Education: N/A   Occupational History  . Not on file.   Social History Main Topics  . Smoking status: Never Smoker   . Smokeless tobacco: Never Used  . Alcohol Use: No  . Drug Use: No  . Sexual Activity: Not on file   Other Topics Concern  . Not on file   Social History Narrative     Review of Systems: General: negative for chills, fever, night sweats or weight changes.  Cardiovascular: negative for chest pain, dyspnea on exertion, edema, orthopnea, palpitations, paroxysmal nocturnal dyspnea or shortness of breath Dermatological: negative for rash Respiratory: negative for cough or wheezing Urologic: negative for hematuria Abdominal: negative for nausea, vomiting, diarrhea, bright red blood per rectum, melena, or hematemesis Neurologic: negative for visual changes, syncope, or dizziness All other systems reviewed and are otherwise negative except as noted above.    Blood pressure 152/82, pulse 46, height 5\' 4"  (1.626 m), weight 198 lb 12.8 oz (90.175 kg).  General appearance: alert and no distress Neck: no adenopathy, no carotid bruit, no JVD, supple, symmetrical, trachea midline and thyroid not enlarged, symmetric, no tenderness/mass/nodules Lungs: clear to auscultation bilaterally Heart: regular rate and rhythm, S1, S2 normal, no murmur, click, rub or gallop Extremities: extremities normal,  atraumatic, no cyanosis or edema  EKG sinus bradycardia 46 without ST or T-wave changes. I personally reviewed this EKG  ASSESSMENT AND PLAN:   Coronary artery disease History of CAD status post coronary artery bypass grafting X 3 in 1999. Her last functional study performed in November 2012 was nonischemic. She denies chest pain or shortness of breath.  Essential hypertension history of hypertensive blood  pressure measures 152/82. She is on amlodipine, hydrated diarrheal, losartan and metoprolol. Continue current meds at current dosing  Hyperlipidemia History of hyperlipidemia on statin therapy followed by her PCP      Virginia Gess [name] [facility], [facility] 11/25/2015 11:01 AM

## 2015-11-25 NOTE — Patient Instructions (Signed)

## 2016-04-26 DIAGNOSIS — F331 Major depressive disorder, recurrent, moderate: Secondary | ICD-10-CM | POA: Insufficient documentation

## 2016-07-27 DIAGNOSIS — F3342 Major depressive disorder, recurrent, in full remission: Secondary | ICD-10-CM | POA: Insufficient documentation

## 2016-08-22 ENCOUNTER — Telehealth: Payer: Self-pay | Admitting: Orthopaedic Surgery

## 2016-12-07 ENCOUNTER — Ambulatory Visit (INDEPENDENT_AMBULATORY_CARE_PROVIDER_SITE_OTHER): Payer: Medicare Other | Admitting: Cardiovascular Disease

## 2016-12-07 ENCOUNTER — Encounter: Payer: Self-pay | Admitting: Cardiovascular Disease

## 2016-12-07 VITALS — BP 142/64 | HR 49 | Ht 65.5 in | Wt 186.4 lb

## 2016-12-07 DIAGNOSIS — I10 Essential (primary) hypertension: Secondary | ICD-10-CM

## 2016-12-07 DIAGNOSIS — I701 Atherosclerosis of renal artery: Secondary | ICD-10-CM | POA: Diagnosis not present

## 2016-12-07 MED ORDER — METOPROLOL SUCCINATE ER 25 MG PO TB24: 25.0000 mg | ORAL_TABLET | Freq: Every day | ORAL | 1 refills | Status: DC

## 2016-12-07 MED ORDER — METOPROLOL SUCCINATE ER 25 MG PO TB24
12.5000 mg | ORAL_TABLET | Freq: Every day | ORAL | 1 refills | Status: DC
Start: 2016-12-07 — End: 2019-01-07

## 2016-12-07 NOTE — Assessment & Plan Note (Signed)
History of left renal artery stenosis with Dopplers performed 11/25/15 revealing widely patent renal arteries. We no longer any pathologies noninvasively.

## 2016-12-07 NOTE — Progress Notes (Addendum)
12/07/2016 ZORAIDA HAVRILLA   June 19, 1940  [id number]  Primary Physician Joette Catching, MD Primary Cardiologist: Runell Gess MD [name]  HPI:  The patient is a 76 year old mildly to moderately overweight divorced Caucasian female, mother of 3 children, grandmother to 4 grandchildren, who I last saw in the office 11/25/15. 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 Her other problems include hypertension, hyperlipidemia, insulin-dependent diabetes. She has had bilateral knee replacements by Dr. Hilda Lias in the past. She denies chest pain or shortness of breath. Her last functional study performed in November 2012 was nonischemic. She had a renal ultrasound to rule out renovascular hypertension, which showed moderate right renal artery stenosis. Since I saw her in the office 12 months ago she's remained completely asymptomatic.    Current Outpatient Prescriptions  Medication Sig Dispense Refill  . amLODipine (NORVASC) 5 MG tablet Take 5 mg by mouth daily.    . Ascorbic Acid (VITAMIN C) 1000 MG tablet Take 1,000 mg by mouth daily.    Marland Kitchen aspirin 325 MG tablet Take 325 mg by mouth daily.    Marland Kitchen atorvastatin (LIPITOR) 80 MG tablet TAKE ONE (1) TABLET EACH DAY    . glyBURIDE-metformin (GLUCOVANCE) 1.25-250 MG per tablet Take 1 tablet by mouth 2 (two) times daily.    . hydrochlorothiazide (HYDRODIURIL) 25 MG tablet Take 25 mg by mouth daily.    . lansoprazole (PREVACID) 15 MG capsule Take 15 mg by mouth daily at 12 noon.    Marland Kitchen losartan-hydrochlorothiazide (HYZAAR) 100-25 MG per tablet Take 1 tablet by mouth daily.    . meloxicam (MOBIC) 7.5 MG tablet TAKE ONE (1) TABLET EACH DAY    . naproxen (NAPROSYN) 500 MG tablet TAKE ONE TABLET TWICE A DAY WITH FOOD 60 tablet 5  . PARoxetine (PAXIL) 10 MG tablet Take 10 mg by mouth  daily.    . rosuvastatin (CRESTOR) 20 MG tablet Take 20 mg by mouth daily.    . metoprolol succinate (TOPROL XL) 25 MG 24 hr tablet Take 0.5 tablets (12.5 mg total) by mouth daily. 45 tablet 1   No current facility-administered medications for this visit.     No Known Allergies  Social History   Social History  . Marital status: Divorced    Spouse name: N/A  . Number of children: N/A  . Years of education: N/A   Occupational History  . Not on file.   Social History Main Topics  . Smoking status: Never Smoker  . Smokeless tobacco: Never Used  . Alcohol use No  . Drug use: No  . Sexual activity: Not on file   Other Topics Concern  . Not on file   Social History Narrative  . No narrative on file     Review of Systems: General: negative for chills, fever, night sweats or weight changes.  Cardiovascular: negative for chest pain, dyspnea on exertion, edema, orthopnea, palpitations, paroxysmal nocturnal dyspnea or shortness of breath Dermatological: negative for rash Respiratory: negative for cough or wheezing Urologic: negative for hematuria Abdominal: negative for nausea, vomiting, diarrhea, bright red blood per rectum, melena, or hematemesis Neurologic: negative for visual changes, syncope, or dizziness All other systems reviewed and are otherwise negative except as noted above.    Blood pressure (!) 142/64, pulse (!) 49, height 5' 5.5" (  1.664 m), weight 186 lb 6.4 oz (84.6 kg).  General appearance: alert and no distress Neck: no adenopathy, no carotid bruit, no JVD, supple, symmetrical, trachea midline and thyroid not enlarged, symmetric, no tenderness/mass/nodules Lungs: clear to auscultation bilaterally Heart: regular rate and rhythm, S1, S2 normal, no murmur, click, rub or gallop Extremities: extremities normal, atraumatic, no cyanosis or edema  EKG Sinus bradycardia 49 and ST or T wave changes. I personally reviewed this EKG.    ASSESSMENT AND PLAN:    Coronary artery disease History of CAD status post coronary artery bypass X 3 grafting in 1999 With a negative Myoview 11/12. She denies chest pain or shortness of breath.  Essential hypertension History of essential hypertension blood pressure measured 142/64. She is on Toprol, amlodipine, losartan and hydrochlorothiazide. Because she is somewhat bradycardic I'm going to Decrease her Toprol from 25 mg a day to 12 Mg a day.  Hyperlipidemia History of hyperlipidemia on statin therapy with recent lipid profile performed by her PCP revealing total cholesterol of 125, LDL 48 and HDL of 62.  Left renal artery stenosis History of left renal artery stenosis with Dopplers performed 11/25/15 revealing widely patent renal arteries. We no longer any pathologies noninvasively.      Runell GessJonathan J. Sevin Langenbach MD FACP,FACC,FAHA, [facility] 12/07/2016 9:06 AM

## 2016-12-07 NOTE — Assessment & Plan Note (Signed)
History of CAD status post coronary artery bypass X 3 grafting in 1999 With a negative Myoview 11/12. She denies chest pain or shortness of breath.

## 2016-12-07 NOTE — Patient Instructions (Signed)
Medication Instructions: Decrease Metoprolol to 25 mg--take 1/2 tablet (12.5 mg) daily.   Follow-Up: Your physician wants you to follow-up in: 1 year with Dr. Allyson SabalBerry. You will receive a reminder letter in the mail two months in advance. If you don't receive a letter, please call our office to schedule the follow-up appointment.  If you need a refill on your cardiac medications before your next appointment, please call your pharmacy.

## 2016-12-07 NOTE — Assessment & Plan Note (Signed)
History of essential hypertension blood pressure measured 142/64. She is on Toprol, amlodipine, losartan and hydrochlorothiazide. Because she is somewhat bradycardic I'm going to Decrease her Toprol from 25 mg a day to 12 Mg a day.

## 2016-12-07 NOTE — Assessment & Plan Note (Signed)
History of hyperlipidemia on statin therapy with recent lipid profile performed by her PCP revealing total cholesterol of 125, LDL 48 and HDL of 62.

## 2017-07-14 ENCOUNTER — Emergency Department (HOSPITAL_COMMUNITY): Payer: Medicare Other

## 2017-07-14 ENCOUNTER — Other Ambulatory Visit: Payer: Self-pay

## 2017-07-14 ENCOUNTER — Encounter (HOSPITAL_COMMUNITY): Payer: Self-pay

## 2017-07-14 ENCOUNTER — Emergency Department (HOSPITAL_COMMUNITY)
Admission: EM | Admit: 2017-07-14 | Discharge: 2017-07-14 | Disposition: A | Discharge status: Home / Self Care | Payer: Medicare Other | Attending: Emergency Medicine | Admitting: Emergency Medicine

## 2017-07-14 DIAGNOSIS — Z79899 Other long term (current) drug therapy: Secondary | ICD-10-CM | POA: Diagnosis not present

## 2017-07-14 DIAGNOSIS — Z7982 Long term (current) use of aspirin: Secondary | ICD-10-CM | POA: Insufficient documentation

## 2017-07-14 DIAGNOSIS — I10 Essential (primary) hypertension: Secondary | ICD-10-CM | POA: Insufficient documentation

## 2017-07-14 DIAGNOSIS — E119 Type 2 diabetes mellitus without complications: Secondary | ICD-10-CM | POA: Insufficient documentation

## 2017-07-14 DIAGNOSIS — Z951 Presence of aortocoronary bypass graft: Secondary | ICD-10-CM | POA: Diagnosis not present

## 2017-07-14 DIAGNOSIS — M25562 Pain in left knee: Secondary | ICD-10-CM

## 2017-07-14 DIAGNOSIS — M1712 Unilateral primary osteoarthritis, left knee: Secondary | ICD-10-CM | POA: Diagnosis not present

## 2017-07-14 DIAGNOSIS — I2510 Atherosclerotic heart disease of native coronary artery without angina pectoris: Secondary | ICD-10-CM | POA: Diagnosis not present

## 2017-07-14 DIAGNOSIS — R600 Localized edema: Secondary | ICD-10-CM | POA: Diagnosis present

## 2017-07-14 LAB — CBG MONITORING, ED
GLUCOSE-CAPILLARY: 96 mg/dL (ref 65–99)
Glucose-Capillary: 47 mg/dL — ABNORMAL LOW (ref 65–99)

## 2017-07-14 MED ORDER — IBUPROFEN 400 MG PO TABS
400.0000 mg | ORAL_TABLET | Freq: Once | ORAL | Status: AC
  Administered 2017-07-14: 400 mg via ORAL
  Filled 2017-07-14: qty 1

## 2017-07-14 NOTE — ED Notes (Signed)
Pt provided with meal tray at this time 

## 2017-07-14 NOTE — Discharge Instructions (Signed)
It was our pleasure to provide your ER care today - we hope that you feel better.  Your xrays show significant degenerative changes in your left knee, and your recent increased activity likely has caused some inflammation in the knee.  Rest. Elevate knee. Ice/coldpacks to sore area. Take motrin or aleve as need for pain.   Follow up with your orthopedist in the next 1-2 weeks.  Return to ER if worse, new symptoms, fevers, redness, severe swelling, other concern.

## 2017-07-14 NOTE — ED Triage Notes (Signed)
Reports of left knee swelling/pain x2 days. Denies injury. Hx of arthritis.

## 2017-07-14 NOTE — ED Provider Notes (Addendum)
[facility] EMERGENCY DEPARTMENT Provider Note   CSN: [id number] Arrival date & time: 07/14/17  1015     History   Chief Complaint Chief Complaint  Patient presents with  . Joint Swelling    Left Knee    HPI Virginia [name] is a 77 y.o. female.  Patient c/o left knee pain for past day. Hx djd, and prior right knee replacement years ago (Dr Hilda Lias).  Patient was helping family member lift/transport wood/pile of wood yesterday. Today with increased pain and mild swelling to knee. Pain is moderate, constant, not radiating, worse w certain movements. No leg swelling. No redness or skin changes. No fever or chills. No direct trauma to knee. No numbness/weakness.   The history is provided by the patient and a relative.    Past Medical History:  Diagnosis Date  . Coronary artery disease    status post coronary artery bypass grafting x3 in 1999  . Hyperlipidemia   . Hypertension   . Renal artery stenosis (HCC)   . S/P CABG (coronary artery bypass graft) 1999   x3  . Type 2 diabetes mellitus [facility])     Patient Active Problem List   Diagnosis Date Noted  . Left renal artery stenosis (HCC) 11/10/2014  . Coronary artery disease 04/23/2013  . Essential hypertension 04/23/2013  . Hyperlipidemia 04/23/2013  . Type 2 diabetes mellitus (HCC) 04/23/2013  . TOTAL KNEE FOLLOW-UP 11/20/2007    Past Surgical History:  Procedure Laterality Date  . CARDIAC CATHETERIZATION  04/17/2007   L main 50-60% ostital stenosis, patent LAD, 90% stenosis at L Cfx, dominant RCA with 60-70% segmental stenosis, patent LIMA-LAD, patent free LIMA-OM, patent VG-PDA (Dr. Erlene Quan)  . CARDIAC CATHETERIZATION  03/02/2003   L main with 50% osital stenosis, LAD totally occluded after 2nd diagonal, L Cfx with 90% ostial OM, RCA with 70-80% segmental stenosis, patent LIMA-LAD, patent free RIMA-Cfx marginal, patent VG to PDA (Dr. Erlene Quan)  . CARDIAC CATHETERIZATION  05/18/1998   significant ostial Cfx & OM & mid  LAD disease - subsequent CABG (Dr. Erlene Quan)  . CAROTID DOPPLER  03/2006   right bulb with 0-49% diameter reduction  . CORONARY ARTERY BYPASS GRAFT  05/19/1998   LIMA to LAD, free RIMA to OM, VG to distal RCA  . NM MYOCAR PERF WALL MOTION  04/2011   lexiscan - fixed paical and basal-mid lateral defect, no reversible ischemia; EF 58%; PVCs noted; low risk  . RENAL DOPPLER  09/2012   right renal artery 60-99% diameter reduction, left renal artery 1-59% diameter reduction  . TOTAL KNEE ARTHROPLASTY Bilateral    Dr. Hilda Lias    OB History    No data available       Home Medications    Prior to Admission medications   Medication Sig Start Date End Date Taking? Authorizing Provider  Ascorbic Acid (VITAMIN C) 1000 MG tablet Take 1,000 mg by mouth daily.   Yes [provider]  aspirin 325 MG tablet Take 325 mg by mouth daily.   Yes [provider]  glyBURIDE-metformin (GLUCOVANCE) 1.25-250 MG per tablet Take 1 tablet by mouth 2 (two) times daily.   Yes [provider]  lansoprazole (PREVACID) 15 MG capsule Take 15 mg by mouth daily at 12 noon.   Yes [provider]  amLODipine (NORVASC) 5 MG tablet Take 5 mg by mouth daily.    [provider]  atorvastatin (LIPITOR) 80 MG tablet TAKE ONE (1) TABLET EACH DAY 10/16/16  [provider]  hydrochlorothiazide (HYDRODIURIL) 25 MG tablet Take 25 mg by mouth daily.    [provider]  losartan-hydrochlorothiazide (HYZAAR) 100-25 MG per tablet Take 1 tablet by mouth daily. 10/15/14   [provider]  meloxicam (MOBIC) 7.5 MG tablet TAKE ONE (1) TABLET EACH DAY 10/16/16   [provider]  metoprolol succinate (TOPROL XL) 25 MG 24 hr tablet Take 0.5 tablets (12.5 mg total) by mouth daily. 12/07/16   Runell Gess, MD  naproxen (NAPROSYN) 500 MG tablet TAKE ONE TABLET TWICE A DAY WITH FOOD 08/27/16   Darreld Mclean, MD  PARoxetine (PAXIL) 10 MG tablet Take 10 mg by mouth daily.     [provider]  rosuvastatin (CRESTOR) 20 MG tablet Take 20 mg by mouth daily.    [provider]    Family History No family history on file.  Social History Social History   Tobacco Use  . Smoking status: Never Smoker  . Smokeless tobacco: Never Used  Substance Use Topics  . Alcohol use: No  . Drug use: No     Allergies   Patient has no known allergies.   Review of Systems Review of Systems  Constitutional: Negative for fever.  Cardiovascular: Negative for leg swelling.  Musculoskeletal: Negative for back pain.  Skin: Negative for rash.  Neurological: Negative for weakness and numbness.     Physical Exam Updated Vital Signs BP (!) 154/96   Pulse 65   Temp 98.4 F (36.9 C) (Oral)   Resp 18   Ht 1.626 m (5\' 4" )   Wt 72.6 kg (160 lb)   SpO2 100%   BMI 27.46 kg/m   Physical Exam  Constitutional: She appears well-developed and well-nourished. No distress.  Eyes: Conjunctivae are normal. No scleral icterus.  Neck: Neck supple. No tracheal deviation present.  Cardiovascular: Intact distal pulses.  Pulmonary/Chest: Effort normal. No respiratory distress.  Abdominal: Normal appearance.  Musculoskeletal: She exhibits no edema.  V mild swelling to left knee, ?minimal effusion. No erythema or increased warmth. No pain w passive rom left knee. Knee is grossly stable. No leg/calf swelling or pain. Distal pulses palp. No pain w rom at hip or ankle.   Neurological: She is alert.  Left leg motor 5/5, sens grossly intact.   Skin: Skin is warm and dry. No rash noted. She is not diaphoretic.  Psychiatric: She has a normal mood and affect.  Nursing note and vitals reviewed.    ED Treatments / Results  Labs (all labs ordered are listed, but only abnormal results are displayed) Labs Reviewed - No data to display  EKG  EKG Interpretation None       Radiology Dg Knee Complete 4 Views Left  Result Date: 07/14/2017 CLINICAL DATA:  Pain and  swelling. EXAM: LEFT KNEE - COMPLETE 4+ VIEW COMPARISON:  None. FINDINGS: No fracture or dislocation. Small joint effusion. Advanced degenerative changes in all 3 compartments. IMPRESSION: Advanced osteoarthritis without acute bony abnormality. Electronically Signed   By: Kennith Center M.D.   On: 07/14/2017 11:17    Procedures Procedures (including critical care time)  Medications Ordered in ED Medications  ibuprofen (ADVIL,MOTRIN) tablet 400 mg (not administered)     Initial Impression / Assessment and Plan / ED Course  I have reviewed the triage vital signs and the nursing notes.  Pertinent labs & imaging results that were available during my care of the patient were reviewed by me and considered in my medical decision making (see  chart for details).  Xrays.  Reviewed nursing notes and prior charts for additional history.   No meds for pain yet today. Motrin po.  Discussed xrays w pt.   Outpatient ortho f/u.   Pt with episode of hypoglycemia in ED, blood sugar in 40's.  Pt has hx diabetes, has been taking her normal meds, but hadnt eaten today.   Po fluids, and meal tray.  Pt feels improved. Glucose improved.     Final Clinical Impressions(s) / ED Diagnoses   Final diagnoses:  None    ED Discharge Orders    None         Cathren LaineSteinl, Mallissa Lorenzen, MD 07/14/17 [phone]

## 2017-07-14 NOTE — ED Notes (Signed)
Entered room to d/c pt and noted she was very sweaty and did not seem to be paying attention to what I was saying.  Asked pt if she felt like glucose low and she declined, but checked anyways.  Given orange juice and peanut butter crackers.  Pt states she only ate a poptart this morning and took her meds.  MD made aware.

## 2017-07-16 ENCOUNTER — Encounter: Payer: Self-pay | Admitting: Orthopaedic Surgery

## 2017-07-16 ENCOUNTER — Ambulatory Visit (INDEPENDENT_AMBULATORY_CARE_PROVIDER_SITE_OTHER): Payer: Medicare Other | Admitting: Orthopaedic Surgery

## 2017-07-16 VITALS — BP 149/78 | HR 57 | Ht 62.5 in | Wt 166.0 lb

## 2017-07-16 DIAGNOSIS — G8929 Other chronic pain: Secondary | ICD-10-CM | POA: Diagnosis not present

## 2017-07-16 DIAGNOSIS — M25562 Pain in left knee: Secondary | ICD-10-CM

## 2017-07-16 NOTE — Progress Notes (Signed)
Subjective:    Patient ID: Virginia [name], female    DOB: September 09, 1940, 77 y.o.   MRN: [id number]  HPI She fell and hurt her left knee on 07-14-17.  She was seen in the ER.  She had swelling, pain and difficulty walking.  X-rays were negative for fracture but she has advanced degenerative changes and an effusion.  She is only slightly better.  She has a knock-knee deformity of the left knee.  She has had pain and popping before the fall.  In early 2013 I did a total knee on the right for her.  She has done well for this and I have not seen her since October of that year. She had knock-knee deformity of the right knee and advanced degenerative changes then before the surgery.  We had talked about the possibility of a total knee on the left but she was doing well with the left knee so I told her to wait until it bothered her.  She is taking Mobic for her arthritis.  Review of Systems  Musculoskeletal: Positive for arthralgias, gait problem and joint swelling.  All other systems reviewed and are negative.  Past Medical History:  Diagnosis Date  . Coronary artery disease    status post coronary artery bypass grafting x3 in 1999  . Hyperlipidemia   . Hypertension   . Renal artery stenosis (HCC)   . S/P CABG (coronary artery bypass graft) 1999   x3  . Type 2 diabetes mellitus (HCC)     Past Surgical History:  Procedure Laterality Date  . CARDIAC CATHETERIZATION  04/17/2007   L main 50-60% ostital stenosis, patent LAD, 90% stenosis at L Cfx, dominant RCA with 60-70% segmental stenosis, patent LIMA-LAD, patent free LIMA-OM, patent VG-PDA (Dr. Erlene Quan)  . CARDIAC CATHETERIZATION  03/02/2003   L main with 50% osital stenosis, LAD totally occluded after 2nd diagonal, L Cfx with 90% ostial OM, RCA with 70-80% segmental stenosis, patent LIMA-LAD, patent free RIMA-Cfx marginal, patent VG to PDA (Dr. Erlene Quan)  . CARDIAC CATHETERIZATION  05/18/1998   significant ostial Cfx & OM & mid LAD disease  - subsequent CABG (Dr. Erlene Quan)  . CAROTID DOPPLER  03/2006   right bulb with 0-49% diameter reduction  . CORONARY ARTERY BYPASS GRAFT  05/19/1998   LIMA to LAD, free RIMA to OM, VG to distal RCA  . NM MYOCAR PERF WALL MOTION  04/2011   lexiscan - fixed paical and basal-mid lateral defect, no reversible ischemia; EF 58%; PVCs noted; low risk  . RENAL DOPPLER  09/2012   right renal artery 60-99% diameter reduction, left renal artery 1-59% diameter reduction  . TOTAL KNEE ARTHROPLASTY Bilateral    Dr. Hilda Lias    Current Outpatient Medications on File Prior to Visit  Medication Sig Dispense Refill  . amLODipine (NORVASC) 5 MG tablet Take 5 mg by mouth daily.    . Ascorbic Acid (VITAMIN C) 1000 MG tablet Take 1,000 mg by mouth daily.    Marland Kitchen aspirin 325 MG tablet Take 325 mg by mouth daily.    Marland Kitchen atorvastatin (LIPITOR) 80 MG tablet TAKE ONE (1) TABLET EACH DAY    . glyBURIDE-metformin (GLUCOVANCE) 1.25-250 MG per tablet Take 1 tablet by mouth 2 (two) times daily.    . hydrochlorothiazide (HYDRODIURIL) 25 MG tablet Take 25 mg by mouth daily.    Marland Kitchen losartan-hydrochlorothiazide (HYZAAR) 100-25 MG per tablet Take 1 tablet by mouth daily.    . meloxicam (MOBIC) 7.5 MG  tablet TAKE ONE (1) TABLET EACH DAY    . metoprolol succinate (TOPROL XL) 25 MG 24 hr tablet Take 0.5 tablets (12.5 mg total) by mouth daily. 45 tablet 1  . PARoxetine (PAXIL) 10 MG tablet Take 10 mg by mouth daily.    . lansoprazole (PREVACID) 15 MG capsule Take 15 mg by mouth daily at 12 noon.     No current facility-administered medications on file prior to visit.     Social History   Socioeconomic History  . Marital status: Divorced    Spouse name: Not on file  . Number of children: Not on file  . Years of education: Not on file  . Highest education level: Not on file  Social Needs  . Financial resource strain: Not on file  . Food insecurity - worry: Not on file  . Food insecurity - inability: Not on file  .  Transportation needs - medical: Not on file  . Transportation needs - non-medical: Not on file  Occupational History  . Not on file  Tobacco Use  . Smoking status: Never Smoker  . Smokeless tobacco: Never Used  Substance and Sexual Activity  . Alcohol use: No  . Drug use: No  . Sexual activity: Not on file  Other Topics Concern  . Not on file  Social History Narrative  . Not on file    History reviewed. No pertinent family history.  BP (!) 149/78   Pulse (!) 57   Ht 5' 2.5" (1.588 m)   Wt 166 lb (75.3 kg)   BMI 29.88 kg/m      Objective:   Physical Exam  Constitutional: She is oriented to person, place, and time. She appears well-developed and well-nourished.  HENT:  Head: Normocephalic and atraumatic.  Eyes: Conjunctivae and EOM are normal. Pupils are equal, round, and reactive to light.  Neck: Normal range of motion. Neck supple.  Cardiovascular: Normal rate, regular rhythm and intact distal pulses.  Pulmonary/Chest: Effort normal.  Abdominal: Soft.  Musculoskeletal: She exhibits tenderness (Left knee with large effusion, ROM 5 to 95 with pain, crepitus, knock-knee deformity, NV intact.  Right knee with midline scar, full ROM, no pain.).  Neurological: She is alert and oriented to person, place, and time. She displays normal reflexes. No cranial nerve deficit. She exhibits normal muscle tone. Coordination normal.  Skin: Skin is warm and dry.  Psychiatric: She has a normal mood and affect. Her behavior is normal. Judgment and thought content normal.  Vitals reviewed.   X-rays had been reviewed.      Assessment & Plan:   Encounter Diagnosis  Name Primary?  . Chronic pain of left knee Yes   PROCEDURE NOTE:  The patient request injection, verbal consent was obtained.  The left knee was prepped appropriately after time out was performed.   Sterile technique was observed and anesthesia was provided by ethyl chloride and a 20-gauge needle was used to inject the  knee area.  A 16-gauge needle was then used to aspirate the knee.  Color of fluid aspirated was yellowish  Total cc's aspirated was 45cc.    Injection of 1 cc of Depo-Medrol 40 mg with several cc's of plain xylocaine was then performed.  A band aid dressing was applied.  The patient was advised to apply ice later today and tomorrow to the injection sight as needed.  She is to continue the Mobic.  Use brace as needed.  She may need MRI or consideration for total knee over  time.  Return in two weeks.  Call if any problem.  Precautions discussed.   Electronically Signed Darreld Mclean, MD 1/29/201910:02 AM

## 2017-07-31 ENCOUNTER — Encounter: Payer: Self-pay | Admitting: Orthopaedic Surgery

## 2017-07-31 ENCOUNTER — Ambulatory Visit (INDEPENDENT_AMBULATORY_CARE_PROVIDER_SITE_OTHER): Payer: Medicare Other | Admitting: Orthopaedic Surgery

## 2017-07-31 VITALS — BP 143/96 | HR 69 | Temp 97.0°F | Ht 62.5 in | Wt 164.0 lb

## 2017-07-31 DIAGNOSIS — M25562 Pain in left knee: Secondary | ICD-10-CM | POA: Diagnosis not present

## 2017-07-31 DIAGNOSIS — G8929 Other chronic pain: Secondary | ICD-10-CM

## 2017-07-31 NOTE — Progress Notes (Signed)
CC:  My knee is so much better  She has little pain of the left knee today.  She is walking well. She has no new trauma.  The shot and aspiration last visit really helped.  NV intact. ROM 0 to 105, she has knock-knee appearance of the left knee (normal for her).  Encounter Diagnosis  Name Primary?  . Chronic pain of left knee Yes   I will see her as needed.  Call if any problem.  Precautions discussed.     Electronically Signed Darreld McleanWayne Jadaya Sommerfield, MD 2/13/201910:19 AM

## 2017-09-30 ENCOUNTER — Other Ambulatory Visit (HOSPITAL_COMMUNITY): Payer: Self-pay | Admitting: Family Medicine

## 2017-09-30 DIAGNOSIS — Y92009 Unspecified place in unspecified non-institutional (private) residence as the place of occurrence of the external cause: Secondary | ICD-10-CM

## 2017-09-30 DIAGNOSIS — S0511XA Contusion of eyeball and orbital tissues, right eye, initial encounter: Secondary | ICD-10-CM

## 2017-09-30 DIAGNOSIS — S0990XA Unspecified injury of head, initial encounter: Secondary | ICD-10-CM

## 2017-09-30 DIAGNOSIS — W19XXXA Unspecified fall, initial encounter: Secondary | ICD-10-CM

## 2017-10-04 ENCOUNTER — Ambulatory Visit (HOSPITAL_COMMUNITY): Payer: Medicare Other

## 2017-10-09 ENCOUNTER — Ambulatory Visit (HOSPITAL_COMMUNITY): Payer: Medicare Other | Attending: Family Medicine

## 2017-10-09 ENCOUNTER — Emergency Department (HOSPITAL_COMMUNITY)
Admission: EM | Admit: 2017-10-09 | Discharge: 2017-10-09 | Disposition: A | Discharge status: Home / Self Care | Payer: Medicare Other | Attending: Emergency Medicine | Admitting: Emergency Medicine

## 2017-10-09 ENCOUNTER — Encounter (HOSPITAL_COMMUNITY): Payer: Self-pay | Admitting: Emergency Medicine

## 2017-10-09 ENCOUNTER — Other Ambulatory Visit: Payer: Self-pay

## 2017-10-09 DIAGNOSIS — Z5321 Procedure and treatment not carried out due to patient leaving prior to being seen by health care provider: Secondary | ICD-10-CM | POA: Diagnosis not present

## 2017-10-09 DIAGNOSIS — R61 Generalized hyperhidrosis: Secondary | ICD-10-CM | POA: Insufficient documentation

## 2017-10-09 DIAGNOSIS — E162 Hypoglycemia, unspecified: Secondary | ICD-10-CM | POA: Insufficient documentation

## 2017-10-09 LAB — CBG MONITORING, ED: GLUCOSE-CAPILLARY: 82 mg/dL (ref 65–99)

## 2017-10-09 NOTE — ED Triage Notes (Signed)
Pt had an appt for a CT scan for her back.  Per family, pt was sweating and pale,  They checked her glucose at home and states it was 53.  CBG in triage was 82.   Pt states " I feel fine now".

## 2017-10-09 NOTE — ED Notes (Signed)
Per registration pt was seen leaving the facility.

## 2017-12-10 ENCOUNTER — Telehealth: Payer: Self-pay | Admitting: Pharmacist Clinician (PhC)/ Clinical Pharmacy Specialist

## 2017-12-10 ENCOUNTER — Encounter: Payer: Self-pay | Admitting: Cardiovascular Disease

## 2017-12-10 ENCOUNTER — Ambulatory Visit (INDEPENDENT_AMBULATORY_CARE_PROVIDER_SITE_OTHER): Payer: Medicare Other | Admitting: Cardiovascular Disease

## 2017-12-10 VITALS — BP 134/64 | HR 69 | Ht 64.0 in | Wt 152.0 lb

## 2017-12-10 DIAGNOSIS — I480 Paroxysmal atrial fibrillation: Secondary | ICD-10-CM

## 2017-12-10 DIAGNOSIS — I10 Essential (primary) hypertension: Secondary | ICD-10-CM | POA: Diagnosis not present

## 2017-12-10 DIAGNOSIS — I482 Chronic atrial fibrillation, unspecified: Secondary | ICD-10-CM | POA: Insufficient documentation

## 2017-12-10 MED ORDER — ASPIRIN EC 81 MG PO TBEC: 81.0000 mg | DELAYED_RELEASE_TABLET | Freq: Every day | ORAL | 3 refills | Status: DC

## 2017-12-10 MED ORDER — APIXABAN 5 MG PO TABS: 5.0000 mg | ORAL_TABLET | Freq: Two times a day (BID) | ORAL | 6 refills | Status: DC

## 2017-12-10 NOTE — Patient Instructions (Signed)
Medication Instructions:   DECREASE ASPIRIN TO 81 MG ONCE DAILY  START ELIQUIS 5 MG TWICE DAILY  Labwork:  Your physician recommends that you return for lab work in: 4 WEEKS  Follow-Up:  Your physician wants you to follow-up in: ONE YEAR WITH DR San MorelleBERRY You will receive a reminder letter in the mail two months in advance. If you don't receive a letter, please call our office to schedule the follow-up appointment.   If you need a refill on your cardiac medications before your next appointment, please call your pharmacy.   TALK TO MEDICAL DOCTOR ABOUT MELOXICAM

## 2017-12-10 NOTE — Progress Notes (Signed)
12/10/2017 Virginia [name]   May 29, 1941  [id number]  Primary Physician Joette Catching, MD Primary Cardiologist: Runell Gess MD FACP, [place], [place], [place]  HPI:  Virginia [name] is a 77 y.o.  mildly to moderately overweight divorced Caucasian female, mother of 3 children, grandmother to 4 grandchildren, who I last saw in the office  12/07/2016. 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 Her other problems include hypertension, hyperlipidemia, insulin-dependent diabetes. She has had bilateral knee replacements by Dr. Hilda Lias in the past. She denies chest pain or shortness of breath. Her last functional study performed in November 2012 was nonischemic. She had a renal ultrasound to rule out renovascular hypertension, which showed moderate right renal artery stenosis. Since I saw her in the office 12 months ago she's remained completely asymptomatic.      Current Meds  Medication Sig  . amLODipine (NORVASC) 5 MG tablet Take 5 mg by mouth daily.  . Ascorbic Acid (VITAMIN C) 1000 MG tablet Take 1,000 mg by mouth daily.  Marland Kitchen atorvastatin (LIPITOR) 80 MG tablet TAKE ONE (1) TABLET EACH DAY  . glyBURIDE-metformin (GLUCOVANCE) 1.25-250 MG per tablet Take 1 tablet by mouth 2 (two) times daily.  . hydrochlorothiazide (HYDRODIURIL) 25 MG tablet Take 25 mg by mouth daily.  . lansoprazole (PREVACID) 15 MG capsule Take 15 mg by mouth daily at 12 noon.  Marland Kitchen losartan-hydrochlorothiazide (HYZAAR) 100-25 MG per tablet Take 1 tablet by mouth daily.  . meloxicam (MOBIC) 7.5 MG tablet TAKE ONE (1) TABLET EACH DAY  . metoprolol succinate (TOPROL XL) 25 MG 24 hr tablet Take 0.5 tablets (12.5 mg total) by mouth daily.  Marland Kitchen PARoxetine (PAXIL) 10 MG tablet Take 10 mg by mouth daily.  . [DISCONTINUED] aspirin 325 MG tablet Take 325 mg by mouth daily.     No  Known Allergies  Social History   Socioeconomic History  . Marital status: Divorced    Spouse name: Not on file  . Number of children: Not on file  . Years of education: Not on file  . Highest education level: Not on file  Occupational History  . Not on file  Social Needs  . Financial resource strain: Not on file  . Food insecurity:    Worry: Not on file    Inability: Not on file  . Transportation needs:    Medical: Not on file    Non-medical: Not on file  Tobacco Use  . Smoking status: Never Smoker  . Smokeless tobacco: Never Used  Substance and Sexual Activity  . Alcohol use: No  . Drug use: No  . Sexual activity: Not on file  Lifestyle  . Physical activity:    Days per week: Not on file    Minutes per session: Not on file  . Stress: Not on file  Relationships  . Social connections:    Talks on phone: Not on file    Gets together: Not on file    Attends religious service: Not on file    Active member of club or organization: Not on file    Attends meetings of clubs or organizations: Not on file    Relationship status: Not on file  . Intimate partner violence:    Fear of current or ex partner: Not on file    Emotionally abused: Not  on file    Physically abused: Not on file    Forced sexual activity: Not on file  Other Topics Concern  . Not on file  Social History Narrative  . Not on file     Review of Systems: General: negative for chills, fever, night sweats or weight changes.  Cardiovascular: negative for chest pain, dyspnea on exertion, edema, orthopnea, palpitations, paroxysmal nocturnal dyspnea or shortness of breath Dermatological: negative for rash Respiratory: negative for cough or wheezing Urologic: negative for hematuria Abdominal: negative for nausea, vomiting, diarrhea, bright red blood per rectum, melena, or hematemesis Neurologic: negative for visual changes, syncope, or dizziness All other systems reviewed and are otherwise negative except as  noted above.    Blood pressure 134/64, pulse 69, height 5\' 4"  (1.626 m), weight 152 lb (68.9 kg).  General appearance: alert and no distress Neck: no adenopathy, no carotid bruit, no JVD, supple, symmetrical, trachea midline and thyroid not enlarged, symmetric, no tenderness/mass/nodules Lungs: clear to auscultation bilaterally Heart: irregularly irregular rhythm Extremities: extremities normal, atraumatic, no cyanosis or edema Pulses: 2+ and symmetric Skin: Skin color, texture, turgor normal. No rashes or lesions Neurologic: Alert and oriented X 3, normal strength and tone. Normal symmetric reflexes. Normal coordination and gait  EKG atrial fibrillation with a ventricular response in the 50s, poor R wave progression, nonspecific IVCD and aberrantly conducted beats.  I personally reviewed this EKG.  ASSESSMENT AND PLAN:   Coronary artery disease History of CAD status post coronary artery bypass grafting x3 in 1999.  Her last Myoview was - 11/12.  She denies chest pain or shortness of breath.  Essential hypertension History of essential hypertension her blood pressure measured at 134/64.  She is on amlodipine, hydrochlorothiazide, and losartan as well as metoprolol.  Continue current meds at current dosing.  Hyperlipidemia History of hyperlipidemia on statin therapy  Chronic atrial fibrillation (HCC) History of chronic A. fib newly recognized during this office visit with a ventricular response 50s.  She did have several aberrantly conducted beats.  I am going to begin her on a novel oral anticoagulant.      Runell GessJonathan J. Luca Dyar MD FACP,FACC,FAHA, [facility] 12/10/2017 9:08 AM

## 2017-12-10 NOTE — Assessment & Plan Note (Signed)
History of CAD status post coronary artery bypass grafting x3 in 1999.  Her last Myoview was - 11/12.  She denies chest pain or shortness of breath.

## 2017-12-10 NOTE — Assessment & Plan Note (Signed)
History of essential hypertension her blood pressure measured at 134/64.  She is on amlodipine, hydrochlorothiazide, and losartan as well as metoprolol.  Continue current meds at current dosing.

## 2017-12-10 NOTE — Assessment & Plan Note (Signed)
History of chronic A. fib newly recognized during this office visit with a ventricular response 50s.  She did have several aberrantly conducted beats.  I am going to begin her on a novel oral anticoagulant.

## 2017-12-10 NOTE — Telephone Encounter (Signed)
Pt was started on Eliquis for atrial fibrillation on December 10, 2017    Reviewed patients medication list.  Pt is not currently on any combined P-gp and strong CYP3A4 inhibitors/inducers (ketoconazole, traconazole, ritonavir, carbamazepine, phenytoin, rifampin, St. John's wort).  Reviewed labs.  SCr 0.65, Weight 68.9 kg, CrCl- 80.1.  Dose appropriate based on age, weight, and SCr.  Hgb and HCT Within Normal Limits (12.4/37.2)  A full discussion of the nature of anticoagulants has been carried out.  A benefit/risk analysis has been presented to the patient, so that they understand the justification for choosing anticoagulation with Eliquis at this time.  The need for compliance is stressed.  Pt is aware to take the medication twice daily.  Side effects of potential bleeding are discussed, including unusual colored urine or stools, coughing up blood or coffee ground emesis, nose bleeds or serious fall or head trauma.  Discussed signs and symptoms of stroke. The patient should avoid any OTC items containing aspirin or ibuprofen.  Avoid alcohol consumption.   Call if any signs of abnormal bleeding.  Discussed financial obligations and resolved any difficulty in obtaining medication.

## 2017-12-10 NOTE — Assessment & Plan Note (Signed)
History of hyperlipidemia on statin therapy. 

## 2017-12-12 ENCOUNTER — Other Ambulatory Visit (HOSPITAL_COMMUNITY): Payer: Self-pay | Admitting: Family Medicine

## 2017-12-12 DIAGNOSIS — I480 Paroxysmal atrial fibrillation: Secondary | ICD-10-CM

## 2017-12-17 ENCOUNTER — Ambulatory Visit (HOSPITAL_COMMUNITY)
Admission: RE | Admit: 2017-12-17 | Discharge: 2017-12-17 | Disposition: A | Discharge status: Home / Self Care | Payer: Medicare Other | Source: Ambulatory Visit | Attending: Family Medicine | Admitting: Family Medicine

## 2017-12-17 DIAGNOSIS — I10 Essential (primary) hypertension: Secondary | ICD-10-CM | POA: Insufficient documentation

## 2017-12-17 DIAGNOSIS — I081 Rheumatic disorders of both mitral and tricuspid valves: Secondary | ICD-10-CM | POA: Diagnosis not present

## 2017-12-17 DIAGNOSIS — E785 Hyperlipidemia, unspecified: Secondary | ICD-10-CM | POA: Insufficient documentation

## 2017-12-17 DIAGNOSIS — E119 Type 2 diabetes mellitus without complications: Secondary | ICD-10-CM | POA: Diagnosis not present

## 2017-12-17 DIAGNOSIS — I2510 Atherosclerotic heart disease of native coronary artery without angina pectoris: Secondary | ICD-10-CM | POA: Diagnosis not present

## 2017-12-17 DIAGNOSIS — I480 Paroxysmal atrial fibrillation: Secondary | ICD-10-CM | POA: Insufficient documentation

## 2017-12-17 NOTE — Progress Notes (Signed)
*  PRELIMINARY RESULTS* Echocardiogram 2D Echocardiogram has been performed.  Jeryl Columbialliott, Mayline Dragon 12/17/2017, 3:50 PM

## 2018-06-17 ENCOUNTER — Other Ambulatory Visit: Payer: Self-pay | Admitting: Cardiovascular Disease

## 2018-06-17 DIAGNOSIS — I480 Paroxysmal atrial fibrillation: Secondary | ICD-10-CM

## 2018-10-17 ENCOUNTER — Other Ambulatory Visit: Payer: Self-pay | Admitting: Cardiovascular Disease

## 2018-10-17 DIAGNOSIS — I480 Paroxysmal atrial fibrillation: Secondary | ICD-10-CM

## 2018-11-20 ENCOUNTER — Telehealth: Payer: Self-pay | Admitting: Cardiovascular Disease

## 2018-11-20 NOTE — Telephone Encounter (Signed)
No mychart, no smartphone, consent, pre reg complete 11/20/18 AF

## 2018-11-26 ENCOUNTER — Telehealth (INDEPENDENT_AMBULATORY_CARE_PROVIDER_SITE_OTHER): Payer: Medicare Other | Admitting: Cardiovascular Disease

## 2018-11-26 ENCOUNTER — Telehealth: Payer: Self-pay

## 2018-11-26 DIAGNOSIS — I2510 Atherosclerotic heart disease of native coronary artery without angina pectoris: Secondary | ICD-10-CM | POA: Diagnosis not present

## 2018-11-26 DIAGNOSIS — E782 Mixed hyperlipidemia: Secondary | ICD-10-CM

## 2018-11-26 DIAGNOSIS — I701 Atherosclerosis of renal artery: Secondary | ICD-10-CM

## 2018-11-26 DIAGNOSIS — I2583 Coronary atherosclerosis due to lipid rich plaque: Secondary | ICD-10-CM

## 2018-11-26 DIAGNOSIS — I482 Chronic atrial fibrillation, unspecified: Secondary | ICD-10-CM

## 2018-11-26 DIAGNOSIS — I10 Essential (primary) hypertension: Secondary | ICD-10-CM

## 2018-11-26 NOTE — Telephone Encounter (Signed)
lmtcb to review AVS from 6/10.  Letter including After Visit Summary and any other necessary documents to be mailed to the patient's address on file.

## 2018-11-26 NOTE — Progress Notes (Signed)
Virtual Visit via Telephone Note   This visit type was conducted due to national recommendations for restrictions regarding the COVID-19 Pandemic (e.g. social distancing) in an effort to limit this patient's exposure and mitigate transmission in our community.  Due to her co-morbid illnesses, this patient is at least at moderate risk for complications without adequate follow up.  This format is felt to be most appropriate for this patient at this time.  The patient did not have access to video technology/had technical difficulties with video requiring transitioning to audio format only (telephone).  All issues noted in this document were discussed and addressed.  No physical exam could be performed with this format.  Please refer to the patient's chart for her  consent to telehealth for [facility].   Date:  11/26/2018   ID:  Virginia [name], DOB 1940/10/05, MRN [id number]  Patient Location: Home Provider Location: Home  PCP:  Dione Housekeeper, MD  Cardiologist: Dr. Quay Burow Electrophysiologist:  None   Evaluation Performed:  Follow-Up Visit  Chief Complaint: Follow-up CAD and chronic A. fib  History of Present Illness:    Virginia [name] is a 78 y.o.  mildly to moderately overweight divorced Caucasian female, mother of 3 children, grandmother to 4 grandchildren, who I last saw in the office  12/10/2017. 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 Her other problems include hypertension, hyperlipidemia, insulin-dependent diabetes. She has had bilateral knee replacements by Dr. Luna Glasgow in the past. She denies chest pain or shortness of breath. Her last functional study performed in November 2012 was nonischemic. She had a renal ultrasound to rule out renovascular hypertension, which showed moderate right renal artery stenosis.   Since I saw her a year ago she is remained stable.  She has no symptoms of COVID-19 and is sheltering in place with her daughter.  She denies chest pain or shortness of breath.  She remains on Eliquis for chronic A. fib.  The patient does not have symptoms concerning for COVID-19 infection (fever, chills, cough, or new shortness of breath).    Past Medical History:  Diagnosis Date  . Coronary artery disease    status post coronary artery bypass grafting x3 in 1999  . Hyperlipidemia   . Hypertension   . Renal artery stenosis ([place])   . S/P CABG (coronary artery bypass graft) 1999   x3  . Type 2 diabetes mellitus ([place])    Past Surgical History:  Procedure Laterality Date  . CARDIAC CATHETERIZATION  04/17/2007   L main 50-60% ostital stenosis, patent LAD, 90% stenosis at L Cfx, dominant RCA with 60-70% segmental stenosis, patent LIMA-LAD, patent free LIMA-OM, patent VG-PDA (Dr. Adora Fridge)  . CARDIAC CATHETERIZATION  03/02/2003   L main with 50% osital stenosis, LAD totally occluded after 2nd diagonal, L Cfx with 90% ostial OM, RCA with 70-80% segmental stenosis, patent LIMA-LAD, patent free RIMA-Cfx marginal, patent VG to PDA (Dr. Adora Fridge)  . CARDIAC CATHETERIZATION  05/18/1998   significant ostial Cfx & OM & mid LAD disease - subsequent CABG (Dr. Adora Fridge)  . CAROTID DOPPLER  03/2006   right bulb with 0-49% diameter reduction  . CORONARY ARTERY BYPASS GRAFT  05/19/1998   LIMA to LAD, free RIMA to OM, VG to distal RCA  . NM MYOCAR PERF WALL MOTION  04/2011  lexiscan - fixed paical and basal-mid lateral defect, no reversible ischemia; EF 58%; PVCs noted; low risk  . RENAL DOPPLER  09/2012   right renal artery 60-99% diameter reduction, left renal artery 1-59% diameter reduction  . TOTAL KNEE ARTHROPLASTY Bilateral    Dr. Hilda LiasKeeling     Current Meds  Medication Sig  . amLODipine (NORVASC) 5 MG tablet Take 5 mg by mouth daily.  . Ascorbic Acid (VITAMIN C) 1000 MG tablet Take 1,000 mg by  mouth daily.  Marland Kitchen. aspirin EC 81 MG tablet Take 1 tablet (81 mg total) by mouth daily.  Marland Kitchen. atorvastatin (LIPITOR) 80 MG tablet TAKE ONE (1) TABLET EACH DAY  . ELIQUIS 5 MG TABS tablet TAKE ONE TABLET BY MOUTH TWICE DAILY  . glyBURIDE-metformin (GLUCOVANCE) 1.25-250 MG per tablet Take 1 tablet by mouth 2 (two) times daily.  . hydrochlorothiazide (HYDRODIURIL) 25 MG tablet Take 25 mg by mouth daily.  . lansoprazole (PREVACID) 15 MG capsule Take 15 mg by mouth daily at 12 noon.  Marland Kitchen. losartan-hydrochlorothiazide (HYZAAR) 100-25 MG per tablet Take 1 tablet by mouth daily.  . meloxicam (MOBIC) 7.5 MG tablet TAKE ONE (1) TABLET EACH DAY  . metoprolol succinate (TOPROL XL) 25 MG 24 hr tablet Take 0.5 tablets (12.5 mg total) by mouth daily.  Marland Kitchen. PARoxetine (PAXIL) 10 MG tablet Take 10 mg by mouth daily.     Allergies:   Patient has no known allergies.   Social History   Tobacco Use  . Smoking status: Never Smoker  . Smokeless tobacco: Never Used  Substance Use Topics  . Alcohol use: No  . Drug use: No     Family Hx: The patient's family history is not on file.  ROS:   Please see the history of present illness.     All other systems reviewed and are negative.   Prior CV studies:   The following studies were reviewed today:  None  Labs/Other Tests and Data Reviewed:    EKG:  No ECG reviewed.  Recent Labs: No results found for requested labs within last 8760 hours.   Recent Lipid Panel No results found for: CHOL, TRIG, HDL, CHOLHDL, LDLCALC, LDLDIRECT  Wt Readings from Last 3 Encounters:  11/26/18 152 lb (68.9 kg)  12/10/17 152 lb (68.9 kg)  10/09/17 160 lb (72.6 kg)     Objective:    Vital Signs:  Ht 5\' 4"  (1.626 m)   Wt 152 lb (68.9 kg)   BMI 26.09 kg/m    VITAL SIGNS:  reviewed a complete physical exam was not performed since this was a virtual telemedicine phone visit  ASSESSMENT & PLAN:    1. Coronary artery disease- history of CAD status post CABG x3 in 1999.  The  patient denies chest pain or shortness of breath. 2. Essential hypertension- history of essential hypertension with blood pressure not measured today.  She is on losartan, hydrochlorothiazide, amlodipine and metoprolol 3. Hyperlipidemia- history of hyperlipidemia on atorvastatin with lipid profile performed 11/05/2018 revealing a total cholesterol 107, LDL 32 and HDL 67 4. Chronic atrial fibrillation- rate controlled on Eliquis oral anticoagulation  COVID-19 Education: The signs and symptoms of COVID-19 were discussed with the patient and how to seek care for testing (follow up with PCP or arrange E-visit).  The importance of social distancing was discussed today.  Time:   Today, I have spent 4 minutes with the patient with telehealth technology discussing the above problems.     Medication Adjustments/Labs and Tests Ordered:  Current medicines are reviewed at length with the patient today.  Concerns regarding medicines are outlined above.   Tests Ordered: No orders of the defined types were placed in this encounter.   Medication Changes: No orders of the defined types were placed in this encounter.   Disposition:  Follow up in 1 year(s)  Signed, Nanetta BattyJonathan Clint Biello, MD  11/26/2018 2:48 PM    [facility]

## 2018-11-26 NOTE — Patient Instructions (Signed)

## 2019-01-06 ENCOUNTER — Other Ambulatory Visit: Payer: Self-pay

## 2019-01-07 ENCOUNTER — Encounter: Payer: Self-pay | Admitting: Physician Assistant

## 2019-01-07 ENCOUNTER — Ambulatory Visit (INDEPENDENT_AMBULATORY_CARE_PROVIDER_SITE_OTHER): Payer: Medicare Other | Admitting: Physician Assistant

## 2019-01-07 VITALS — BP 134/78 | HR 78 | Ht 64.0 in | Wt 151.4 lb

## 2019-01-07 DIAGNOSIS — I2583 Coronary atherosclerosis due to lipid rich plaque: Secondary | ICD-10-CM | POA: Diagnosis not present

## 2019-01-07 DIAGNOSIS — I2510 Atherosclerotic heart disease of native coronary artery without angina pectoris: Secondary | ICD-10-CM | POA: Diagnosis not present

## 2019-01-07 DIAGNOSIS — L989 Disorder of the skin and subcutaneous tissue, unspecified: Secondary | ICD-10-CM | POA: Diagnosis not present

## 2019-01-07 DIAGNOSIS — I482 Chronic atrial fibrillation, unspecified: Secondary | ICD-10-CM | POA: Diagnosis not present

## 2019-01-12 NOTE — Progress Notes (Signed)
BP 134/78   Pulse 78   Ht 5\' 4"  (1.626 m)   Wt 151 lb 6.4 oz (68.7 kg)   BMI 25.99 kg/m    Subjective:    Patient ID: Virginia [name], female    DOB: 1941-06-12, 78 y.o.   MRN: [id number]  HPI: Virginia [name] is a [age] y.o. female presenting on 01/07/2019 for New Patient (Initial Visit) (Dr. Lysbeth GalasNyland)  This patient comes in to establish with us.  She has been a patient of Dr. Reece Leaderonnelly for many years.  Overall her medical conditions do include renal artery stenosis, hypertension, coronary artery disease, atrial fibrillation, GERD, diabetes well controlled, arthritis of the knee, depression, hyperlipidemia.  She does have an area on her scalp that is going bald.  It will start as a rough edge however it never once or it has pus.  She denies any fever.  It happened last summer.  And they did heal up.  She has a new big one on top of her head and some new one starting in the crown of her head.  We will make a orthopedic referral soon as possible.  Past Medical History:  Diagnosis Date  . Coronary artery disease    status post coronary artery bypass grafting x3 in 1999  . Hyperlipidemia   . Hypertension   . Renal artery stenosis ([facility])   . S/P CABG (coronary artery bypass graft) 1999   x3  . Type 2 diabetes mellitus ([facility])    Relevant past medical, surgical, family and social history reviewed and updated as indicated. Interim medical history since our last visit reviewed. Allergies and medications reviewed and updated. DATA REVIEWED: CHART IN EPIC  Family History reviewed for pertinent findings.  Review of Systems  Constitutional: Negative.   HENT: Negative.   Eyes: Negative.   Respiratory: Negative.   Gastrointestinal: Negative.   Genitourinary: Negative.   Skin: Positive for color change.    Allergies as of 01/07/2019   No Known Allergies     Medication List       Accurate as of January 07, 2019 11:59 PM. If you have any questions, ask your nurse or doctor.        STOP  taking these medications   glyBURIDE-metformin 1.25-250 MG tablet Commonly known as: GLUCOVANCE Stopped by: Remus LofflerAngel S Chayton Murata, PA-C   lansoprazole 15 MG capsule Commonly known as: PREVACID Stopped by: Remus LofflerAngel S Chanah Tidmore, PA-C     TAKE these medications   amLODipine 5 MG tablet Commonly known as: NORVASC Take 5 mg by mouth daily.   aspirin EC 81 MG tablet Take 1 tablet (81 mg total) by mouth daily.   atorvastatin 80 MG tablet Commonly known as: LIPITOR TAKE ONE (1) TABLET EACH DAY   Eliquis 5 MG Tabs tablet Generic drug: apixaban TAKE ONE TABLET BY MOUTH TWICE DAILY   hydrochlorothiazide 25 MG tablet Commonly known as: HYDRODIURIL Take 25 mg by mouth daily.   losartan-hydrochlorothiazide 100-25 MG tablet Commonly known as: HYZAAR Take 1 tablet by mouth daily.   meloxicam 7.5 MG tablet Commonly known as: MOBIC TAKE ONE (1) TABLET EACH DAY   metoprolol succinate 50 MG 24 hr tablet Commonly known as: TOPROL-XL TAKE 1 AND 1/2 TABLETS DAILY What changed: Another medication with the same name was removed. Continue taking this medication, and follow the directions you see here. Changed by: Remus LofflerAngel S Ahnna Dungan, PA-C   PARoxetine 10 MG tablet Commonly known as: PAXIL Take 10 mg by mouth daily.  vitamin C 1000 MG tablet Take 1,000 mg by mouth daily.          Objective:    BP 134/78   Pulse 78   Ht 5\' 4"  (1.626 m)   Wt 151 lb 6.4 oz (68.7 kg)   BMI 25.99 kg/m   No Known Allergies  Wt Readings from Last 3 Encounters:  01/07/19 151 lb 6.4 oz (68.7 kg)  11/26/18 152 lb (68.9 kg)  12/10/17 152 lb (68.9 kg)    Physical Exam Constitutional:      Appearance: She is well-developed.  HENT:     Head: Normocephalic and atraumatic.      Comments: Slightly raised skin colored lesion on the scalp, no pustules or blisters Eyes:     Conjunctiva/sclera: Conjunctivae normal.     Pupils: Pupils are equal, round, and reactive to light.  Cardiovascular:     Rate and Rhythm: Normal  rate and regular rhythm.     Heart sounds: Normal heart sounds.  Pulmonary:     Effort: Pulmonary effort is normal.     Breath sounds: Normal breath sounds.  Abdominal:     General: Bowel sounds are normal.     Palpations: Abdomen is soft.  Skin:    General: Skin is warm and dry.     Findings: No rash.  Neurological:     Mental Status: She is alert and oriented to person, place, and time.     Deep Tendon Reflexes: Reflexes are normal and symmetric.  Psychiatric:        Behavior: Behavior normal.        Thought Content: Thought content normal.        Judgment: Judgment normal.     Results for orders placed or performed during the hospital encounter of 10/09/17  CBG monitoring, ED  Result Value Ref Range   Glucose-Capillary 82 65 - 99 mg/dL      Assessment & Plan:   1. Chronic atrial fibrillation - metoprolol succinate (TOPROL-XL) 50 MG 24 hr tablet; TAKE 1 AND 1/2 TABLETS DAILY  2. Coronary artery disease due to lipid rich plaque - metoprolol succinate (TOPROL-XL) 50 MG 24 hr tablet; TAKE 1 AND 1/2 TABLETS DAILY  3. Scalp lesion - Ambulatory referral to Dermatology    Continue all other maintenance medications as listed above.  Follow up plan: Return in about 2 months (around 03/10/2019).  Educational handout given for Russell PA-C [facility] [address]  [place], [place] [address] [phone]   01/12/2019, 6:20 PM

## 2019-02-10 ENCOUNTER — Other Ambulatory Visit: Payer: Self-pay | Admitting: Cardiovascular Disease

## 2019-02-10 DIAGNOSIS — I480 Paroxysmal atrial fibrillation: Secondary | ICD-10-CM

## 2019-02-10 NOTE — Telephone Encounter (Signed)
Please review for refill. Thank you! 

## 2019-02-10 NOTE — Telephone Encounter (Signed)
lmomed for way overdue labs

## 2019-02-11 ENCOUNTER — Other Ambulatory Visit: Payer: Self-pay

## 2019-02-11 DIAGNOSIS — I482 Chronic atrial fibrillation, unspecified: Secondary | ICD-10-CM

## 2019-02-11 NOTE — Telephone Encounter (Signed)
SCr may 2020 0.76.  Age 78 - continue with Eliquis 5 mg bid

## 2019-02-16 ENCOUNTER — Other Ambulatory Visit: Payer: Self-pay

## 2019-02-16 DIAGNOSIS — I482 Chronic atrial fibrillation, unspecified: Secondary | ICD-10-CM

## 2019-02-16 LAB — COMPREHENSIVE METABOLIC PANEL
ALT: 17 IU/L (ref 0–32)
AST: 25 IU/L (ref 0–40)
Albumin/Globulin Ratio: 2.4 — ABNORMAL HIGH (ref 1.2–2.2)
Albumin: 4.4 g/dL (ref 3.7–4.7)
Alkaline Phosphatase: 79 IU/L (ref 39–117)
BUN/Creatinine Ratio: 15 (ref 12–28)
BUN: 15 mg/dL (ref 8–27)
Bilirubin Total: 1.5 mg/dL — ABNORMAL HIGH (ref 0.0–1.2)
CO2: 25 mmol/L (ref 20–29)
Calcium: 10.2 mg/dL (ref 8.7–10.3)
Chloride: 100 mmol/L (ref 96–106)
Creatinine, Ser: 0.99 mg/dL (ref 0.57–1.00)
GFR calc Af Amer: 64 mL/min/{1.73_m2} (ref >59)
GFR calc non Af Amer: 55 mL/min/{1.73_m2} — ABNORMAL LOW (ref >59)
Globulin, Total: 1.8 g/dL (ref 1.5–4.5)
Glucose: 119 mg/dL — ABNORMAL HIGH (ref 65–99)
Potassium: 3.9 mmol/L (ref 3.5–5.2)
Sodium: 142 mmol/L (ref 134–144)
Total Protein: 6.2 g/dL (ref 6.0–8.5)

## 2019-02-17 ENCOUNTER — Telehealth: Payer: Self-pay | Admitting: *Deleted

## 2019-02-17 DIAGNOSIS — R17 Unspecified jaundice: Secondary | ICD-10-CM

## 2019-02-17 NOTE — Telephone Encounter (Addendum)
Left message of results for pt, Lab orders mailed to the pt   ----- Message from Lorretta Harp, MD sent at 02/16/2019  5:12 PM EDT ----- Labs okay except for slightly elevated bilirubin.  Repeat liver function test in 3 months.

## 2019-03-06 ENCOUNTER — Ambulatory Visit (HOSPITAL_COMMUNITY)
Admission: RE | Admit: 2019-03-06 | Discharge: 2019-03-06 | Disposition: A | Discharge status: Home / Self Care | Payer: Medicare Other | Source: Ambulatory Visit | Attending: Family Medicine | Admitting: Family Medicine

## 2019-03-06 ENCOUNTER — Ambulatory Visit (INDEPENDENT_AMBULATORY_CARE_PROVIDER_SITE_OTHER): Payer: Medicare Other | Admitting: Family Medicine

## 2019-03-06 ENCOUNTER — Other Ambulatory Visit: Payer: Self-pay

## 2019-03-06 ENCOUNTER — Encounter: Payer: Self-pay | Admitting: Family Medicine

## 2019-03-06 VITALS — BP 118/60 | HR 64 | Temp 97.7°F | Resp 18 | Ht 64.0 in | Wt 152.0 lb

## 2019-03-06 DIAGNOSIS — R220 Localized swelling, mass and lump, head: Secondary | ICD-10-CM

## 2019-03-06 DIAGNOSIS — H1131 Conjunctival hemorrhage, right eye: Secondary | ICD-10-CM

## 2019-03-06 DIAGNOSIS — W19XXXA Unspecified fall, initial encounter: Secondary | ICD-10-CM

## 2019-03-06 DIAGNOSIS — S0990XA Unspecified injury of head, initial encounter: Secondary | ICD-10-CM

## 2019-03-06 DIAGNOSIS — Z23 Encounter for immunization: Secondary | ICD-10-CM

## 2019-03-06 NOTE — Progress Notes (Signed)
Subjective:  Patient ID: Virginia [name], female    DOB: 09-30-1940, 78 y.o.   MRN: [id number]  Patient Care Team: Theodoro Clock as PCP - General (Physician Assistant)   Chief Complaint:  Fall (right eye red and facial bruising )   HPI: Virginia [name] is a 78 y.o. female presenting on 03/06/2019 for Fall (right eye red and facial bruising )   Pt presents today for bruising to right face due to a fall. Pt states she fell on 02/27/2019. States she was walking up the stairs and tripped on the last step landing on the concrete porch. She hit above her right eye on the porch. She denies LOC. No pain. Pt states she was concerned because of the extensive bruising and blood in her eye. No visual changes, headaches, ear pain, dizziness, syncope, confusion, weakness, neck pain, or loss of function.   Fall The accident occurred 5 to 7 days ago. The fall occurred while walking. She fell from a height of 1 to 2 ft. She landed on concrete. The point of impact was the face. The patient is experiencing no pain. Pertinent negatives include no abdominal pain, bowel incontinence, fever, headaches, hearing loss, hematuria, loss of consciousness, nausea, numbness, tingling, visual change or vomiting. She has tried nothing for the symptoms.     Relevant past medical, surgical, family, and social history reviewed and updated as indicated.  Allergies and medications reviewed and updated. Date reviewed: Chart in Epic.   Past Medical History:  Diagnosis Date  . Coronary artery disease    status post coronary artery bypass grafting x3 in 1999  . Hyperlipidemia   . Hypertension   . Renal artery stenosis ([place])   . S/P CABG (coronary artery bypass graft) 1999   x3  . Type 2 diabetes mellitus ([place])     Past Surgical History:  Procedure Laterality Date  . CARDIAC CATHETERIZATION  04/17/2007   L main 50-60% ostital stenosis, patent LAD, 90% stenosis at L Cfx, dominant RCA with 60-70% segmental  stenosis, patent LIMA-LAD, patent free LIMA-OM, patent VG-PDA (Dr. Adora Fridge)  . CARDIAC CATHETERIZATION  03/02/2003   L main with 50% osital stenosis, LAD totally occluded after 2nd diagonal, L Cfx with 90% ostial OM, RCA with 70-80% segmental stenosis, patent LIMA-LAD, patent free RIMA-Cfx marginal, patent VG to PDA (Dr. Adora Fridge)  . CARDIAC CATHETERIZATION  05/18/1998   significant ostial Cfx & OM & mid LAD disease - subsequent CABG (Dr. Adora Fridge)  . CAROTID DOPPLER  03/2006   right bulb with 0-49% diameter reduction  . CORONARY ARTERY BYPASS GRAFT  05/19/1998   LIMA to LAD, free RIMA to OM, VG to distal RCA  . NM MYOCAR PERF WALL MOTION  04/2011   lexiscan - fixed paical and basal-mid lateral defect, no reversible ischemia; EF 58%; PVCs noted; low risk  . RENAL DOPPLER  09/2012   right renal artery 60-99% diameter reduction, left renal artery 1-59% diameter reduction  . TOTAL KNEE ARTHROPLASTY Bilateral    Dr. Luna Glasgow    Social History   Socioeconomic History  . Marital status: Divorced    Spouse name: Not on file  . Number of children: Not on file  . Years of education: Not on file  . Highest education level: Not on file  Occupational History  . Not on file  Social Needs  . Financial resource strain: Not on file  . Food insecurity    Worry: Not on  file    Inability: Not on file  . Transportation needs    Medical: Not on file    Non-medical: Not on file  Tobacco Use  . Smoking status: Never Smoker  . Smokeless tobacco: Never Used  Substance and Sexual Activity  . Alcohol use: No  . Drug use: No  . Sexual activity: Not on file  Lifestyle  . Physical activity    Days per week: Not on file    Minutes per session: Not on file  . Stress: Not on file  Relationships  . Social Musicianconnections    Talks on phone: Not on file    Gets together: Not on file    Attends religious service: Not on file    Active member of club or organization: Not on file    Attends meetings of clubs  or organizations: Not on file    Relationship status: Not on file  . Intimate partner violence    Fear of current or ex partner: Not on file    Emotionally abused: Not on file    Physically abused: Not on file    Forced sexual activity: Not on file  Other Topics Concern  . Not on file  Social History Narrative  . Not on file    Outpatient Encounter Medications as of 03/06/2019  Medication Sig  . amLODipine (NORVASC) 5 MG tablet Take 5 mg by mouth daily.  . Ascorbic Acid (VITAMIN C) 1000 MG tablet Take 1,000 mg by mouth daily.  Marland Kitchen. aspirin EC 81 MG tablet Take 1 tablet (81 mg total) by mouth daily.  Marland Kitchen. atorvastatin (LIPITOR) 80 MG tablet TAKE ONE (1) TABLET EACH DAY  . ELIQUIS 5 MG TABS tablet TAKE ONE TABLET BY MOUTH TWICE DAILY  . hydrochlorothiazide (HYDRODIURIL) 25 MG tablet Take 25 mg by mouth daily.  Marland Kitchen. losartan-hydrochlorothiazide (HYZAAR) 100-25 MG per tablet Take 1 tablet by mouth daily.  . meloxicam (MOBIC) 7.5 MG tablet TAKE ONE (1) TABLET EACH DAY  . metoprolol succinate (TOPROL-XL) 50 MG 24 hr tablet TAKE 1 AND 1/2 TABLETS DAILY  . PARoxetine (PAXIL) 10 MG tablet Take 10 mg by mouth daily.   No facility-administered encounter medications on file as of 03/06/2019.     No Known Allergies  Review of Systems  Constitutional: Negative for activity change, appetite change, chills, diaphoresis, fatigue, fever and unexpected weight change.  HENT: Negative.   Eyes: Positive for redness. Negative for photophobia, pain, discharge, itching and visual disturbance.  Respiratory: Negative for cough, chest tightness and shortness of breath.   Cardiovascular: Negative for chest pain, palpitations and leg swelling.  Gastrointestinal: Negative for abdominal distention, abdominal pain, blood in stool, bowel incontinence, constipation, diarrhea, nausea and vomiting.  Endocrine: Negative.   Genitourinary: Negative for dysuria, frequency, hematuria and urgency.  Musculoskeletal: Negative for  arthralgias, back pain, gait problem, joint swelling, myalgias, neck pain and neck stiffness.  Skin: Positive for color change and wound. Negative for pallor and rash.  Allergic/Immunologic: Negative.   Neurological: Negative for dizziness, tingling, tremors, seizures, loss of consciousness, syncope, facial asymmetry, speech difficulty, weakness, light-headedness, numbness and headaches.  Hematological: Negative.  Negative for adenopathy. Does not bruise/bleed easily.  Psychiatric/Behavioral: Negative for agitation, confusion, hallucinations, sleep disturbance and suicidal ideas.  All other systems reviewed and are negative.       Objective:  BP 118/60   Pulse 64   Temp 97.7 F (36.5 C)   Resp 18   Ht 5\' 4"  (1.626 m)  Wt 152 lb (68.9 kg)   SpO2 95%   BMI 26.09 kg/m    Wt Readings from Last 3 Encounters:  03/06/19 152 lb (68.9 kg)  01/07/19 151 lb 6.4 oz (68.7 kg)  11/26/18 152 lb (68.9 kg)    Physical Exam Vitals signs and nursing note reviewed.  Constitutional:      General: She is not in acute distress.    Appearance: Normal appearance. She is well-developed and well-groomed. She is not ill-appearing, toxic-appearing or diaphoretic.  HENT:     Head: Normocephalic. Raccoon eyes (ecchymosis under right eye) and contusion (just above right lateral eyebrow) present. No Battle's sign, abrasion, masses, right periorbital erythema, left periorbital erythema or laceration. Hair is normal.     Jaw: There is normal jaw occlusion.      Right Ear: Hearing, tympanic membrane, ear canal and external ear normal. No drainage or swelling. No mastoid tenderness. No hemotympanum. Tympanic membrane is not erythematous or bulging.     Left Ear: Hearing, tympanic membrane, ear canal and external ear normal. No drainage or swelling. No mastoid tenderness. No hemotympanum. Tympanic membrane is not erythematous or bulging.     Nose: Nose normal. No nasal deformity or nasal tenderness.      Mouth/Throat:     Lips: Pink.     Mouth: Mucous membranes are moist. No injury, lacerations or oral lesions.     Pharynx: Oropharynx is clear. Uvula midline.  Eyes:     General: Lids are normal. Vision grossly intact. Gaze aligned appropriately. No visual field deficit.    Extraocular Movements: Extraocular movements intact.     Right eye: Normal extraocular motion and no nystagmus.     Left eye: Normal extraocular motion and no nystagmus.     Conjunctiva/sclera:     Right eye: Right conjunctiva is not injected. Hemorrhage (subconjunctival, no hyphema or ruptured globe) present. No chemosis or exudate.    Pupils: Pupils are equal, round, and reactive to light.     Visual Fields: Right eye visual fields normal and left eye visual fields normal.  Neck:     Musculoskeletal: Normal range of motion and neck supple. Normal range of motion. No edema, erythema, neck rigidity, crepitus, injury, pain with movement, torticollis, spinous process tenderness or muscular tenderness.     Thyroid: No thyroid mass, thyromegaly or thyroid tenderness.     Vascular: No carotid bruit or JVD.     Trachea: Trachea and phonation normal.  Cardiovascular:     Rate and Rhythm: Normal rate. Rhythm regularly irregular.     Chest Wall: PMI is not displaced.     Pulses: Normal pulses.     Heart sounds: Normal heart sounds. No murmur. No friction rub. No gallop.   Pulmonary:     Effort: Pulmonary effort is normal. No respiratory distress.     Breath sounds: Normal breath sounds. No wheezing.  Abdominal:     General: Bowel sounds are normal. There is no distension or abdominal bruit.     Palpations: Abdomen is soft. There is no hepatomegaly or splenomegaly.     Tenderness: There is no abdominal tenderness. There is no right CVA tenderness or left CVA tenderness.     Hernia: No hernia is present.  Musculoskeletal: Normal range of motion.     Right lower leg: No edema.     Left lower leg: No edema.  Lymphadenopathy:      Cervical: No cervical adenopathy.  Skin:    General: Skin is warm  and dry.     Capillary Refill: Capillary refill takes less than 2 seconds.     Coloration: Skin is not cyanotic, jaundiced or pale.     Findings: Bruising, ecchymosis and signs of injury present. No abrasion, erythema, laceration, lesion or rash.       Neurological:     General: No focal deficit present.     Mental Status: She is alert and oriented to person, place, and time.     Cranial Nerves: Cranial nerves are intact. No cranial nerve deficit, dysarthria or facial asymmetry.     Sensory: Sensation is intact.     Motor: Motor function is intact.     Coordination: Coordination is intact.     Gait: Gait is intact.     Deep Tendon Reflexes: Reflexes are normal and symmetric.  Psychiatric:        Attention and Perception: Attention and perception normal.        Mood and Affect: Mood and affect normal.        Speech: Speech normal.        Behavior: Behavior normal. Behavior is cooperative.        Thought Content: Thought content normal.        Cognition and Memory: Cognition and memory normal.        Judgment: Judgment normal.     Results for orders placed or performed in visit on 02/16/19  Comprehensive Metabolic Panel (CMET)  Result Value Ref Range   Glucose 119 (H) 65 - 99 mg/dL   BUN 15 8 - 27 mg/dL   Creatinine, Ser 4.09 0.57 - 1.00 mg/dL   GFR calc non Af Amer 55 (L) >59 mL/min/1.73   GFR calc Af Amer 64 >59 mL/min/1.73   BUN/Creatinine Ratio 15 12 - 28   Sodium 142 134 - 144 mmol/L   Potassium 3.9 3.5 - 5.2 mmol/L   Chloride 100 96 - 106 mmol/L   CO2 25 20 - 29 mmol/L   Calcium 10.2 8.7 - 10.3 mg/dL   Total Protein 6.2 6.0 - 8.5 g/dL   Albumin 4.4 3.7 - 4.7 g/dL   Globulin, Total 1.8 1.5 - 4.5 g/dL   Albumin/Globulin Ratio 2.4 (H) 1.2 - 2.2   Bilirubin Total 1.5 (H) 0.0 - 1.2 mg/dL   Alkaline Phosphatase 79 39 - 117 IU/L   AST 25 0 - 40 IU/L   ALT 17 0 - 32 IU/L         Pertinent labs &  imaging results that were available during my care of the patient were reviewed by me and considered in my medical decision making.  Assessment & Plan:  Cherrelle was seen today for fall.  Diagnoses and all orders for this visit:  Fall, initial encounter Traumatic injury of head, initial encounter Localized swelling, mass, and lump of head No focal or neurologic deficits. Pt is on Eliquis and sustained a significant fall with blow to head. Will order imaging. Pt agrees to go have imaging completed today. No pain reported. Further treatment to be decided based on imaging results.  -     CT Head Wo Contrast; Future -     CT Maxillofacial WO CM; Future  Subconjunctival hemorrhage of right eye Vision intact. No pain. No hyphema or ruptured globe. Symptomatic care discussed. Report any new or worsening symptoms.   Need for immunization against influenza -     Flu Vaccine QUAD High Dose(Fluad)     Continue all other maintenance medications.  Follow up plan: Return if symptoms worsen or fail to improve.  Continue healthy lifestyle choices, including diet (rich in fruits, vegetables, and lean proteins, and low in salt and simple carbohydrates) and exercise (at least 30 minutes of moderate physical activity daily).  Educational handout given for subconjunctival hemorrhage  The above assessment and management plan was discussed with the patient. The patient verbalized understanding of and has agreed to the management plan. Patient is aware to call the clinic if they develop any new symptoms or if symptoms persist or worsen. Patient is aware when to return to the clinic for a follow-up visit. Patient educated on when it is appropriate to go to the emergency department.   Kari BaarsMichelle Kiara Keep, FNP-C [facility] [phone]  03/06/2019 1220 Imaging has resulted. No acute abnormalities. Pt aware of results. Symptomatic care discussed. Pt aware to report any new or worsening  symptoms. Follow up as needed.   The above assessment and management plan was discussed with the patient. The patient verbalized understanding of and has agreed to the management plan. Patient is aware to call the clinic if they develop any new symptoms or if symptoms fail to improve or worsen. Patient is aware when to return to the clinic for a follow-up visit. Patient educated on when it is appropriate to go to the emergency department.   Kari BaarsMichelle Adama Ferber, FNP-C [facility] [address] [place], [place] [address] [phone]

## 2019-03-06 NOTE — Patient Instructions (Addendum)
Subconjunctival Hemorrhage Subconjunctival hemorrhage is bleeding that happens between the white part of your eye (sclera) and the clear membrane that covers the outside of your eye (conjunctiva). There are many tiny blood vessels near the surface of your eye. A subconjunctival hemorrhage happens when one or more of these vessels breaks and bleeds, causing a red patch to appear on your eye. This is similar to a bruise. Depending on the amount of bleeding, the red patch may only cover a small area of your eye or it may cover the entire visible part of the sclera. If a lot of blood collects under the conjunctiva, there may also be swelling. Subconjunctival hemorrhages do not affect your vision or cause pain, but your eye may feel irritated if there is swelling. Subconjunctival hemorrhages usually do not require treatment, and they usually disappear on their own within two weeks. What are the causes? This condition may be caused by:  Mild trauma, such as rubbing your eye too hard.  Blunt injuries, such as from playing sports or having contact with a deployed airbag.  Coughing, sneezing, or vomiting.  Straining, such as when lifting a heavy object.  High blood pressure.  Recent eye surgery.  Diabetes.  Certain medicines, especially blood thinners (anticoagulants).  Other conditions, such as eye tumors, bleeding disorders, or blood vessel abnormalities. Subconjunctival hemorrhages can also happen without an obvious cause. What are the signs or symptoms? Symptoms of this condition include:  A bright red or dark red patch on the white part of the eye. The red area may: ? Spread out to cover a larger area of the eye before it goes away. ? Turn brownish-yellow before it goes away.  Swelling around the eye.  Mild eye irritation. How is this diagnosed? This condition is diagnosed with a physical exam. If your subconjunctival hemorrhage was caused by trauma, your health care provider may refer  you to an eye specialist (ophthalmologist) or another specialist to check for other injuries. You may have other tests, including:  An eye exam.  A blood pressure check.  Blood tests to check for bleeding disorders. If your subconjunctival hemorrhage was caused by trauma, X-rays or a CT scan may be done to check for other injuries. How is this treated? Usually, treatment is not needed for this condition. If you have discomfort, your health care provider may recommend eye drops or cold compresses. Follow these instructions at home:  Take over-the-counter and prescription medicines only as directed by your health care provider.  Use eye drops or cold compresses to help with discomfort as directed by your health care provider.  Avoid activities, things, and environments that may irritate or injure your eye.  Keep all follow-up visits as told by your health care provider. This is important. Contact a health care provider if:  You have pain in your eye.  The bleeding does not go away within 3 weeks.  You keep getting new subconjunctival hemorrhages. Get help right away if:  Your vision changes or you have difficulty seeing.  You suddenly develop severe sensitivity to light.  You develop a severe headache, persistent vomiting, confusion, or abnormal tiredness (lethargy).  Your eye seems to bulge or protrude from your eye socket.  You develop unexplained bruises on your body.  You have unexplained bleeding in another area of your body. Summary  Subconjunctival hemorrhage is bleeding that happens between the white part of your eye and the clear membrane that covers the outside of your eye.  This condition   is similar to a bruise.  Subconjunctival hemorrhages usually do not require treatment, and they usually disappear on their own within two weeks.  Use eye drops or cold compresses to help with discomfort as directed by your health care provider. This information is not  intended to replace advice given to you by your health care provider. Make sure you discuss any questions you have with your health care provider. Document Released: 06/04/2005 Document Revised: 03/05/2018 Document Reviewed: 03/05/2018 Elsevier Patient Education  2020 [employer] Head Injury, Adult There are many types of head injuries. They can be as minor as a bump. Some head injuries can be worse. Worse injuries include:  A strong hit to the head that shakes the brain back and forth causing damage (concussion).  A bruise (contusion) of the brain. This means there is bleeding in the brain that can cause swelling.  A cracked skull (skull fracture).  Bleeding in the brain that gathers, gets thick (makes a clot), and forms a bump (hematoma). Most problems from a head injury come in the first 24 hours. However, you may still have side effects up to 7-10 days after your injury. It is important to watch your condition for any changes. You may need to be watched in the emergency department or urgent care, or you may need to stay in the hospital. What are the causes? There are many possible causes of a head injury. A serious head injury may be caused by:  A car accident.  Bicycle or motorcycle accidents.  Sports injuries.  Falls. What are the signs or symptoms? Symptoms of a head injury include a bruise, bump, or bleeding where the injury happened. Other physical symptoms may include:  Headache.  Feeling sick to your stomach (nauseous) or vomiting.  Dizziness.  Feeling tired.  Being uncomfortable around bright lights or loud noises.  Shaking movements that you cannot control (seizures).  Trouble being woken up.  Passing out (fainting). Mental or emotional symptoms may include:  Feeling grumpy or cranky.  Confusion and memory problems.  Having trouble paying attention or concentrating.  Changes in eating or sleeping habits.  Feeling worried or nervous (anxious).   Feeling sad (depressed). How is this treated? Treatment for this condition depends on how severe the injury is and the type of injury you have. The main goal is to prevent complications and to allow the brain time to heal. Mild head injury If you have a mild head injury, you may be sent home and treatment may include:  Being watched. A responsible adult should stay with you for 24 hours after your injury and check on you often.  Physical rest.  Brain rest.  Pain medicines. Severe head injury If you have a severe head injury, treatment may include:  Being watched closely. This includes hospitalization with frequent physical exams.  Medicines to: ? Help with pain. ? Prevent shaking movements that you cannot control. ? Help with brain swelling.  Using a machine that helps you breathe (ventilator).  Treatments to manage the swelling inside the brain.  Brain surgery. This may be needed to: ? Remove a blood clot. ? Stop the bleeding. ? Remove a part of the skull. This allows room for the brain to swell. Follow these instructions at home: Activity  Rest.  Avoid activities that are hard or tiring.  Make sure you get enough sleep.  Limit activities that need a lot of thought or attention, such as: ? Watching TV. ? Playing memory games and puzzles. ?  Job-related work or homework. ? Working on [place] computer, [employer]social media, and texting.  Avoid activities that could cause another head injury until your doctor says it is okay. This includes playing sports. Having another head injury, especially before the first one has healed, can be dangerous.  Ask your doctor when it is safe for you to go back to your normal activities, such as work or school. Ask your doctor for a step-by-step plan for slowly going back to your normal activities.  Ask your doctor when you can drive, ride a bicycle, or use heavy machinery. Do not do these activities if you are dizzy. Lifestyle   Do not drink  alcohol until your doctor says it is okay.  Do not use drugs.  If it is harder than usual to remember things, write them down.  If you are easily distracted, try to do one thing at a time.  Talk with family members or close friends when making important decisions.  Tell your friends, family, a trusted coworker, and work Production designer, theatre/television/filmmanager about your injury, symptoms, and limits (restrictions). Have them watch for any problems that are new or getting worse. General instructions  Take over-the-counter and prescription medicines only as told by your doctor.  Have someone stay with you for 24 hours after your head injury. This person should watch you for any changes in your symptoms and be ready to get help.  Keep all follow-up visits as told by your doctor. This is important. How is this prevented?  Work on Therapist, musicyour balance and strength. This can help you avoid falls.  Wear a seatbelt when you are in a moving vehicle.  Wear a helmet when you: ? Ride a bicycle. ? Ski. ? Do any other sport or activity that has a risk of injury.  If you drink alcohol: ? Limit how much you use to: ? 0-1 drink a day for women. ? 0-2 drinks a day for men. ? Be aware of how much alcohol is in your drink. In the [place] one drink equals one 12 oz bottle of beer (355 mL), one 5 oz glass of wine (148 mL), or one 1 oz glass of hard liquor (44 mL).  Make your home safer by: ? Getting rid of clutter from the floors and stairs. This includes things that can make you trip. ? Using grab bars in bathrooms and handrails by stairs. ? Placing non-slip mats on floors and in bathtubs. ? Putting more light in dim areas. Get help right away if:  You have: ? A very bad headache that is not helped by medicine. ? Trouble walking or weakness in your arms and legs. ? Clear or bloody fluid coming from your nose or ears. ? Changes in how you see (vision). ? Shaking movements that you cannot control.  You lose your balance.  You  vomit.  The black centers of your eyes (pupils) change in size.  Your speech is slurred.  Your dizziness gets worse.  You pass out.  You are sleepier than normal and have trouble staying awake.  Your symptoms get worse. These symptoms may be an emergency. Do not wait to see if the symptoms will go away. Get medical help right away. Call your local emergency services (911 in the [place] Do not drive yourself to the hospital. Summary  There are many types of head injuries. They can be as minor as a bump. Some head injuries can be worse  Treatment for this condition depends on how severe  the injury is and the type of injury you have.  Ask your doctor when it is safe for you to go back to your normal activities, such as work or school.  To prevent a head injury, wear a seat belt in a car, wear a helmet when you use a a bicycle, limit your alcohol use, and make your home safer. This information is not intended to replace advice given to you by your health care provider. Make sure you discuss any questions you have with your health care provider. Document Released: 05/17/2008 Document Revised: 09/25/2018 Document Reviewed: 06/27/2018 Elsevier Patient Education  2020 [employer].

## 2019-03-09 ENCOUNTER — Ambulatory Visit (HOSPITAL_COMMUNITY): Payer: Medicare Other

## 2019-07-16 ENCOUNTER — Encounter: Payer: Self-pay | Admitting: Physician Assistant

## 2019-07-16 ENCOUNTER — Ambulatory Visit (INDEPENDENT_AMBULATORY_CARE_PROVIDER_SITE_OTHER): Payer: Medicare Other | Admitting: Physician Assistant

## 2019-07-16 ENCOUNTER — Other Ambulatory Visit: Payer: Self-pay

## 2019-07-16 VITALS — BP 138/78 | HR 80 | Temp 97.0°F | Ht 64.0 in | Wt 154.8 lb

## 2019-07-16 DIAGNOSIS — E1159 Type 2 diabetes mellitus with other circulatory complications: Secondary | ICD-10-CM

## 2019-07-16 DIAGNOSIS — I10 Essential (primary) hypertension: Secondary | ICD-10-CM

## 2019-07-16 DIAGNOSIS — I2510 Atherosclerotic heart disease of native coronary artery without angina pectoris: Secondary | ICD-10-CM

## 2019-07-16 DIAGNOSIS — I2583 Coronary atherosclerosis due to lipid rich plaque: Secondary | ICD-10-CM

## 2019-07-16 NOTE — Progress Notes (Signed)
Acute Office Visit  Subjective:    Patient ID: Virginia [name], female    DOB: 1940-12-10, 79 y.o.   MRN: [id number]  Chief Complaint  Patient presents with  . Medical Management of Chronic Issues    6 month follow up     Hypertension This is a chronic problem. The current episode started more than 1 year ago. The problem is controlled. Pertinent negatives include no chest pain. Risk factors for coronary artery disease include post-menopausal state. Past treatments include calcium channel blockers, diuretics, angiotensin blockers and beta blockers. There are no compliance problems.  There is no history of angina or CVA.  Hyperlipidemia This is a chronic problem. The problem is controlled. Recent lipid tests were reviewed and are normal. There are no known factors aggravating her hyperlipidemia. Pertinent negatives include no chest pain. Current antihyperlipidemic treatment includes statins. The current treatment provides significant improvement of lipids. There are no compliance problems.   Depression        This is a chronic problem.  The current episode started more than 1 year ago.   Associated symptoms include no fatigue.  Past treatments include SSRIs - Selective serotonin reuptake inhibitors.  Compliance with treatment is good.  Previous treatment provided significant relief.  Depression screen [facility] 2/9 07/16/2019 03/06/2019 01/07/2019  Decreased Interest 0 0 0  Down, Depressed, Hopeless 0 0 0  PHQ - 2 Score 0 0 0  Altered sleeping 0 - -  Tired, decreased energy 0 - -  Change in appetite 0 - -  Feeling bad or failure about yourself  0 - -  Trouble concentrating 0 - -  Moving slowly or fidgety/restless 0 - -  Suicidal thoughts 0 - -  PHQ-9 Score 0 - -  Difficult doing work/chores Not difficult at all - -     Past Medical History:  Diagnosis Date  . Coronary artery disease    status post coronary artery bypass grafting x3 in 1999  . Hyperlipidemia   . Hypertension   . Renal  artery stenosis ([place])   . S/P CABG (coronary artery bypass graft) 1999   x3  . Type 2 diabetes mellitus ([place])     Past Surgical History:  Procedure Laterality Date  . CARDIAC CATHETERIZATION  04/17/2007   L main 50-60% ostital stenosis, patent LAD, 90% stenosis at L Cfx, dominant RCA with 60-70% segmental stenosis, patent LIMA-LAD, patent free LIMA-OM, patent VG-PDA (Dr. Adora Fridge)  . CARDIAC CATHETERIZATION  03/02/2003   L main with 50% osital stenosis, LAD totally occluded after 2nd diagonal, L Cfx with 90% ostial OM, RCA with 70-80% segmental stenosis, patent LIMA-LAD, patent free RIMA-Cfx marginal, patent VG to PDA (Dr. Adora Fridge)  . CARDIAC CATHETERIZATION  05/18/1998   significant ostial Cfx & OM & mid LAD disease - subsequent CABG (Dr. Adora Fridge)  . CAROTID DOPPLER  03/2006   right bulb with 0-49% diameter reduction  . CORONARY ARTERY BYPASS GRAFT  05/19/1998   LIMA to LAD, free RIMA to OM, VG to distal RCA  . NM MYOCAR PERF WALL MOTION  04/2011   lexiscan - fixed paical and basal-mid lateral defect, no reversible ischemia; EF 58%; PVCs noted; low risk  . RENAL DOPPLER  09/2012   right renal artery 60-99% diameter reduction, left renal artery 1-59% diameter reduction  . TOTAL KNEE ARTHROPLASTY Bilateral    Dr. Luna Glasgow    Family History  Problem Relation Age of Onset  . Stroke Mother  Social History   Socioeconomic History  . Marital status: Divorced    Spouse name: Not on file  . Number of children: Not on file  . Years of education: Not on file  . Highest education level: Not on file  Occupational History  . Not on file  Tobacco Use  . Smoking status: Never Smoker  . Smokeless tobacco: Never Used  Substance and Sexual Activity  . Alcohol use: No  . Drug use: No  . Sexual activity: Not on file  Other Topics Concern  . Not on file  Social History Narrative  . Not on file   Social Determinants of Health   Financial Resource Strain:   . Difficulty of Paying  Living Expenses: Not on file  Food Insecurity:   . Worried About Charity fundraiser in the Last Year: Not on file  . Ran Out of Food in the Last Year: Not on file  Transportation Needs:   . Lack of Transportation (Medical): Not on file  . Lack of Transportation (Non-Medical): Not on file  Physical Activity:   . Days of Exercise per Week: Not on file  . Minutes of Exercise per Session: Not on file  Stress:   . Feeling of Stress : Not on file  Social Connections:   . Frequency of Communication with Friends and Family: Not on file  . Frequency of Social Gatherings with Friends and Family: Not on file  . Attends Religious Services: Not on file  . Active Member of Clubs or Organizations: Not on file  . Attends Archivist Meetings: Not on file  . Marital Status: Not on file  Intimate Partner Violence:   . Fear of Current or Ex-Partner: Not on file  . Emotionally Abused: Not on file  . Physically Abused: Not on file  . Sexually Abused: Not on file    Outpatient Medications Prior to Visit  Medication Sig Dispense Refill  . amLODipine (NORVASC) 5 MG tablet Take 5 mg by mouth daily.    . Ascorbic Acid (VITAMIN C) 1000 MG tablet Take 1,000 mg by mouth daily.    Marland Kitchen aspirin EC 81 MG tablet Take 1 tablet (81 mg total) by mouth daily. 90 tablet 3  . atorvastatin (LIPITOR) 80 MG tablet TAKE ONE (1) TABLET EACH DAY    . ELIQUIS 5 MG TABS tablet TAKE ONE TABLET BY MOUTH TWICE DAILY 180 tablet 1  . hydrochlorothiazide (HYDRODIURIL) 25 MG tablet Take 25 mg by mouth daily.    Marland Kitchen losartan-hydrochlorothiazide (HYZAAR) 100-25 MG per tablet Take 1 tablet by mouth daily.    . meloxicam (MOBIC) 7.5 MG tablet TAKE ONE (1) TABLET EACH DAY    . metoprolol succinate (TOPROL-XL) 50 MG 24 hr tablet TAKE 1 AND 1/2 TABLETS DAILY    . PARoxetine (PAXIL) 10 MG tablet Take 10 mg by mouth daily.     No facility-administered medications prior to visit.    No Known Allergies  Review of Systems    Constitutional: Negative.  Negative for activity change, fatigue and fever.  HENT: Negative.   Eyes: Negative.   Respiratory: Negative.  Negative for cough.   Cardiovascular: Negative.  Negative for chest pain.  Gastrointestinal: Negative.  Negative for abdominal pain.  Endocrine: Negative.   Genitourinary: Negative.  Negative for dysuria.  Musculoskeletal: Negative.   Skin: Negative.   Neurological: Negative.   Psychiatric/Behavioral: Positive for depression.       Objective:    Physical Exam Constitutional:  General: She is not in acute distress.    Appearance: Normal appearance. She is well-developed.  HENT:     Head: Normocephalic and atraumatic.  Cardiovascular:     Rate and Rhythm: Normal rate.  Pulmonary:     Effort: Pulmonary effort is normal.  Skin:    General: Skin is warm and dry.     Findings: No rash.  Neurological:     Mental Status: She is alert and oriented to person, place, and time.     Deep Tendon Reflexes: Reflexes are normal and symmetric.     BP 138/78   Pulse 80   Temp (!) 97 F (36.1 C) (Temporal)   Ht '5\' 4"'  (1.626 m)   Wt 154 lb 12.8 oz (70.2 kg)   SpO2 96%   BMI 26.57 kg/m  Wt Readings from Last 3 Encounters:  07/16/19 154 lb 12.8 oz (70.2 kg)  03/06/19 152 lb (68.9 kg)  01/07/19 151 lb 6.4 oz (68.7 kg)    Health Maintenance Due  Topic Date Due  . FOOT EXAM  [birth year]/04/1951  . OPHTHALMOLOGY EXAM  03/04/1951  . TETANUS/TDAP  03/03/1960  . DEXA SCAN  03/03/2006  . HEMOGLOBIN A1C  05/09/2008    There are no preventive care reminders to display for this patient.   No results found for: TSH Lab Results  Component Value Date   WBC 6.8 11/09/2007   HGB 9.5 (L) 11/09/2007   HCT 27.5 (L) 11/09/2007   MCV 89.4 11/09/2007   PLT 173 11/09/2007   Lab Results  Component Value Date   NA 142 02/16/2019   K 3.9 02/16/2019   CO2 25 02/16/2019   GLUCOSE 119 (H) 02/16/2019   BUN 15 02/16/2019   CREATININE 0.99 02/16/2019   BILITOT  1.5 (H) 02/16/2019   ALKPHOS 79 02/16/2019   AST 25 02/16/2019   ALT 17 02/16/2019   PROT 6.2 02/16/2019   ALBUMIN 4.4 02/16/2019   CALCIUM 10.2 02/16/2019   No results found for: CHOL No results found for: HDL No results found for: LDLCALC No results found for: TRIG No results found for: CHOLHDL Lab Results  Component Value Date   HGBA1C  11/07/2007    6.0 (NOTE)   The ADA recommends the following therapeutic goals for glycemic   control related to Hgb A1C measurement:   Goal of Therapy:   < 7.0% Hgb A1C   Action Suggested:  > 8.0% Hgb A1C   Ref:  Diabetes Care, 22, Suppl. 1, 1999       Assessment & Plan:   Problem List Items Addressed This Visit      Cardiovascular and Mediastinum   CAD (coronary artery disease) - Primary   Relevant Orders   CBC with Differential/Platelet   CMP14+EGFR   Lipid panel   TSH   Hypertension associated with diabetes ([place])   Relevant Orders   CBC with Differential/Platelet   CMP14+EGFR   Lipid panel   TSH       No orders of the defined types were placed in this encounter.    Terald Sleeper, PA-C   Terald Sleeper PA-C [facility] [address]  [place], [place] [address] [phone]

## 2019-07-17 LAB — LIPID PANEL
Chol/HDL Ratio: 1.8 ratio (ref 0.0–4.4)
Cholesterol, Total: 128 mg/dL (ref 100–199)
HDL: 72 mg/dL (ref >39)
LDL Chol Calc (NIH): 42 mg/dL (ref 0–99)
Triglycerides: 65 mg/dL (ref 0–149)
VLDL Cholesterol Cal: 14 mg/dL (ref 5–40)

## 2019-07-17 LAB — CBC WITH DIFFERENTIAL/PLATELET
Basophils Absolute: 0.1 10*3/uL (ref 0.0–0.2)
Basos: 1 %
EOS (ABSOLUTE): 0.2 10*3/uL (ref 0.0–0.4)
Eos: 3 %
Hematocrit: 39.1 % (ref 34.0–46.6)
Hemoglobin: 13.4 g/dL (ref 11.1–15.9)
Immature Grans (Abs): 0 10*3/uL (ref 0.0–0.1)
Immature Granulocytes: 0 %
Lymphocytes Absolute: 2.1 10*3/uL (ref 0.7–3.1)
Lymphs: 33 %
MCH: 31.1 pg (ref 26.6–33.0)
MCHC: 34.3 g/dL (ref 31.5–35.7)
MCV: 91 fL (ref 79–97)
Monocytes Absolute: 0.5 10*3/uL (ref 0.1–0.9)
Monocytes: 7 %
Neutrophils Absolute: 3.5 10*3/uL (ref 1.4–7.0)
Neutrophils: 56 %
Platelets: 219 10*3/uL (ref 150–450)
RBC: 4.31 x10E6/uL (ref 3.77–5.28)
RDW: 13 % (ref 11.7–15.4)
WBC: 6.3 10*3/uL (ref 3.4–10.8)

## 2019-07-17 LAB — CMP14+EGFR
ALT: 15 IU/L (ref 0–32)
AST: 27 IU/L (ref 0–40)
Albumin/Globulin Ratio: 2 (ref 1.2–2.2)
Albumin: 4.2 g/dL (ref 3.7–4.7)
Alkaline Phosphatase: 84 IU/L (ref 39–117)
BUN/Creatinine Ratio: 15 (ref 12–28)
BUN: 12 mg/dL (ref 8–27)
Bilirubin Total: 1.2 mg/dL (ref 0.0–1.2)
CO2: 31 mmol/L — ABNORMAL HIGH (ref 20–29)
Calcium: 10 mg/dL (ref 8.7–10.3)
Chloride: 100 mmol/L (ref 96–106)
Creatinine, Ser: 0.81 mg/dL (ref 0.57–1.00)
GFR calc Af Amer: 80 mL/min/{1.73_m2} (ref >59)
GFR calc non Af Amer: 70 mL/min/{1.73_m2} (ref >59)
Globulin, Total: 2.1 g/dL (ref 1.5–4.5)
Glucose: 121 mg/dL — ABNORMAL HIGH (ref 65–99)
Potassium: 4 mmol/L (ref 3.5–5.2)
Sodium: 141 mmol/L (ref 134–144)
Total Protein: 6.3 g/dL (ref 6.0–8.5)

## 2019-07-17 LAB — TSH: TSH: 1.78 u[IU]/mL (ref 0.450–4.500)

## 2019-07-20 ENCOUNTER — Telehealth: Payer: Self-pay | Admitting: Physician Assistant

## 2019-07-20 NOTE — Telephone Encounter (Signed)
Pt aware of labs  

## 2019-07-24 ENCOUNTER — Ambulatory Visit (INDEPENDENT_AMBULATORY_CARE_PROVIDER_SITE_OTHER): Payer: Medicare Other | Admitting: Family Medicine

## 2019-07-24 ENCOUNTER — Encounter: Payer: Self-pay | Admitting: Family Medicine

## 2019-07-24 DIAGNOSIS — M25562 Pain in left knee: Secondary | ICD-10-CM | POA: Diagnosis not present

## 2019-07-24 DIAGNOSIS — Z91199 Patient's noncompliance with other medical treatment and regimen due to unspecified reason: Secondary | ICD-10-CM

## 2019-07-24 DIAGNOSIS — Z5329 Procedure and treatment not carried out because of patient's decision for other reasons: Secondary | ICD-10-CM

## 2019-07-24 MED ORDER — PREDNISONE 20 MG PO TABS: ORAL_TABLET | ORAL | 0 refills | Status: DC

## 2019-07-24 NOTE — Progress Notes (Signed)
Attempted to reach pt by phone for visit three times with no answer.

## 2019-07-24 NOTE — Progress Notes (Signed)
Virtual Visit via telephone Note Due to COVID-19 pandemic this visit was conducted virtually. This visit type was conducted due to national recommendations for restrictions regarding the COVID-19 Pandemic (e.g. social distancing, sheltering in place) in an effort to limit this patient's exposure and mitigate transmission in our community. All issues noted in this document were discussed and addressed.  A physical exam was not performed with this format.   I connected with Virginia [name] on 07/24/2019 at 1130 by telephone and verified that I am speaking with the correct person using two identifiers. Virginia [name] is currently located at home and family is currently with them during visit. The provider, Kari Baars, FNP is located in their office at time of visit.  I discussed the limitations, risks, security and privacy concerns of performing an evaluation and management service by telephone and the availability of in person appointments. I also discussed with the patient that there may be a patient responsible charge related to this service. The patient expressed understanding and agreed to proceed.  Subjective:  Patient ID: Virginia [name], female    DOB: 07-16-1940, 79 y.o.   MRN: [id number]  Chief Complaint:  Knee Pain   HPI: Virginia [name] is a 79 y.o. female presenting on 07/24/2019 for Knee Pain   Knee Pain  The incident occurred 3 to 5 days ago. There was no injury mechanism. The pain is present in the left knee. The pain is at a severity of 5/10. The pain is moderate. The pain has been constant since onset. Associated symptoms include an inability to bear weight. Pertinent negatives include no loss of motion, loss of sensation, muscle weakness, numbness or tingling. The symptoms are aggravated by movement and weight bearing. She has tried immobilization, ice, rest and NSAIDs for the symptoms. The treatment provided no relief.     Relevant past medical, surgical, family, and social  history reviewed and updated as indicated.  Allergies and medications reviewed and updated.   Past Medical History:  Diagnosis Date  . Coronary artery disease    status post coronary artery bypass grafting x3 in 1999  . Hyperlipidemia   . Hypertension   . Renal artery stenosis (HCC)   . S/P CABG (coronary artery bypass graft) 1999   x3  . Type 2 diabetes mellitus (HCC)     Past Surgical History:  Procedure Laterality Date  . CARDIAC CATHETERIZATION  04/17/2007   L main 50-60% ostital stenosis, patent LAD, 90% stenosis at L Cfx, dominant RCA with 60-70% segmental stenosis, patent LIMA-LAD, patent free LIMA-OM, patent VG-PDA (Dr. Erlene Quan)  . CARDIAC CATHETERIZATION  03/02/2003   L main with 50% osital stenosis, LAD totally occluded after 2nd diagonal, L Cfx with 90% ostial OM, RCA with 70-80% segmental stenosis, patent LIMA-LAD, patent free RIMA-Cfx marginal, patent VG to PDA (Dr. Erlene Quan)  . CARDIAC CATHETERIZATION  05/18/1998   significant ostial Cfx & OM & mid LAD disease - subsequent CABG (Dr. Erlene Quan)  . CAROTID DOPPLER  03/2006   right bulb with 0-49% diameter reduction  . CORONARY ARTERY BYPASS GRAFT  05/19/1998   LIMA to LAD, free RIMA to OM, VG to distal RCA  . NM MYOCAR PERF WALL MOTION  04/2011   lexiscan - fixed paical and basal-mid lateral defect, no reversible ischemia; EF 58%; PVCs noted; low risk  . RENAL DOPPLER  09/2012   right renal artery 60-99% diameter reduction, left renal artery 1-59% diameter reduction  . TOTAL KNEE  ARTHROPLASTY Bilateral    Dr. Hilda Lias    Social History   Socioeconomic History  . Marital status: Divorced    Spouse name: Not on file  . Number of children: Not on file  . Years of education: Not on file  . Highest education level: Not on file  Occupational History  . Not on file  Tobacco Use  . Smoking status: Never Smoker  . Smokeless tobacco: Never Used  Substance and Sexual Activity  . Alcohol use: No  . Drug use: No  .  Sexual activity: Not on file  Other Topics Concern  . Not on file  Social History Narrative  . Not on file   Social Determinants of Health   Financial Resource Strain:   . Difficulty of Paying Living Expenses: Not on file  Food Insecurity:   . Worried About Programme researcher, broadcasting/film/video in the Last Year: Not on file  . Ran Out of Food in the Last Year: Not on file  Transportation Needs:   . Lack of Transportation (Medical): Not on file  . Lack of Transportation (Non-Medical): Not on file  Physical Activity:   . Days of Exercise per Week: Not on file  . Minutes of Exercise per Session: Not on file  Stress:   . Feeling of Stress : Not on file  Social Connections:   . Frequency of Communication with Friends and Family: Not on file  . Frequency of Social Gatherings with Friends and Family: Not on file  . Attends Religious Services: Not on file  . Active Member of Clubs or Organizations: Not on file  . Attends Banker Meetings: Not on file  . Marital Status: Not on file  Intimate Partner Violence:   . Fear of Current or Ex-Partner: Not on file  . Emotionally Abused: Not on file  . Physically Abused: Not on file  . Sexually Abused: Not on file    Outpatient Encounter Medications as of 07/24/2019  Medication Sig  . amLODipine (NORVASC) 5 MG tablet Take 5 mg by mouth daily.  . Ascorbic Acid (VITAMIN C) 1000 MG tablet Take 1,000 mg by mouth daily.  Marland Kitchen aspirin EC 81 MG tablet Take 1 tablet (81 mg total) by mouth daily.  Marland Kitchen atorvastatin (LIPITOR) 80 MG tablet TAKE ONE (1) TABLET EACH DAY  . ELIQUIS 5 MG TABS tablet TAKE ONE TABLET BY MOUTH TWICE DAILY  . hydrochlorothiazide (HYDRODIURIL) 25 MG tablet Take 25 mg by mouth daily.  Marland Kitchen losartan-hydrochlorothiazide (HYZAAR) 100-25 MG per tablet Take 1 tablet by mouth daily.  . meloxicam (MOBIC) 7.5 MG tablet TAKE ONE (1) TABLET EACH DAY  . metoprolol succinate (TOPROL-XL) 50 MG 24 hr tablet TAKE 1 AND 1/2 TABLETS DAILY  . PARoxetine  (PAXIL) 10 MG tablet Take 10 mg by mouth daily.  . predniSONE (DELTASONE) 20 MG tablet 2 po at sametime daily for 5 days   No facility-administered encounter medications on file as of 07/24/2019.    No Known Allergies  Review of Systems  Constitutional: Negative for activity change, appetite change, chills, diaphoresis, fatigue, fever and unexpected weight change.  HENT: Negative.   Eyes: Negative.   Respiratory: Negative for cough, chest tightness and shortness of breath.   Cardiovascular: Negative for chest pain, palpitations and leg swelling.  Gastrointestinal: Negative for abdominal pain, blood in stool, constipation, diarrhea, nausea and vomiting.  Endocrine: Negative.   Genitourinary: Negative for dysuria, frequency and urgency.  Musculoskeletal: Positive for arthralgias, gait problem and joint  swelling. Negative for myalgias.  Skin: Negative.   Allergic/Immunologic: Negative.   Neurological: Negative for dizziness, tingling, tremors, seizures, syncope, facial asymmetry, speech difficulty, weakness, light-headedness, numbness and headaches.  Hematological: Negative.   Psychiatric/Behavioral: Negative for confusion, hallucinations, sleep disturbance and suicidal ideas.  All other systems reviewed and are negative.        Observations/Objective: No vital signs or physical exam, this was a telephone or virtual health encounter.  Pt alert and oriented, answers all questions appropriately, and able to speak in full sentences.    Assessment and Plan: [place] was seen today for knee pain.  Diagnoses and all orders for this visit:  Acute pain of left knee Left knee pain and swelling. No known injury. No erythema or increased warmth. Has been using ice, Mobic, and ace wrap without relief of symptoms. Has appointment with ortho on Tuesday. Will burst with steroids. Symptomatic care discussed in detail. Continue Mobic, can take tylenol as needed for pain. Crutches for ambulation  support. Report any new, worsening, or persistent symptoms. Medications as prescribed.  -     predniSONE (DELTASONE) 20 MG tablet; 2 po at sametime daily for 5 days     Follow Up Instructions: Return if symptoms worsen or fail to improve.    I discussed the assessment and treatment plan with the patient. The patient was provided an opportunity to ask questions and all were answered. The patient agreed with the plan and demonstrated an understanding of the instructions.   The patient was advised to call back or seek an in-person evaluation if the symptoms worsen or if the condition fails to improve as anticipated.  The above assessment and management plan was discussed with the patient. The patient verbalized understanding of and has agreed to the management plan. Patient is aware to call the clinic if they develop any new symptoms or if symptoms persist or worsen. Patient is aware when to return to the clinic for a follow-up visit. Patient educated on when it is appropriate to go to the emergency department.    I provided 15 minutes of non-face-to-face time during this encounter. The call started at 1130. The call ended at 1145. The other time was used for coordination of care.    Monia Pouch, FNP-C [facility] [address] [place],  [id number] [phone] 07/24/2019

## 2019-07-28 ENCOUNTER — Other Ambulatory Visit: Payer: Self-pay

## 2019-07-28 ENCOUNTER — Encounter: Payer: Self-pay | Admitting: Orthopaedic Surgery

## 2019-07-28 ENCOUNTER — Ambulatory Visit: Payer: Medicare Other

## 2019-07-28 ENCOUNTER — Ambulatory Visit (INDEPENDENT_AMBULATORY_CARE_PROVIDER_SITE_OTHER): Payer: Medicare Other | Admitting: Orthopaedic Surgery

## 2019-07-28 VITALS — BP 139/74 | HR 73 | Ht 64.0 in | Wt 152.0 lb

## 2019-07-28 DIAGNOSIS — M25562 Pain in left knee: Secondary | ICD-10-CM

## 2019-07-28 DIAGNOSIS — I2510 Atherosclerotic heart disease of native coronary artery without angina pectoris: Secondary | ICD-10-CM | POA: Diagnosis not present

## 2019-07-28 DIAGNOSIS — G8929 Other chronic pain: Secondary | ICD-10-CM | POA: Diagnosis not present

## 2019-07-28 DIAGNOSIS — I2583 Coronary atherosclerosis due to lipid rich plaque: Secondary | ICD-10-CM | POA: Diagnosis not present

## 2019-07-28 NOTE — Progress Notes (Signed)
Patient Virginia [name], female DOB:30-Oct-1940, 79 y.o. LKG:[id number]  Chief Complaint  Patient presents with  . Knee Pain    left / increase pain in past week     HPI  Virginia [name] is a 79 y.o. female who has increasing pain in the left knee with popping and knock-knee deformity.  She has no new trauma,no redness.  She has some giving way.  I did a total knee on the right many years ago and she is doing very well with this.   Body mass index is 26.09 kg/m.  ROS  Review of Systems  Constitutional: Positive for activity change.  Musculoskeletal: Positive for arthralgias, gait problem and joint swelling.  All other systems reviewed and are negative.   All other systems reviewed and are negative.  The following is a summary of the past history medically, past history surgically, known current medicines, social history and family history.  This information is gathered electronically by the computer from prior information and documentation.  I review this each visit and have found including this information at this point in the chart is beneficial and informative.    Past Medical History:  Diagnosis Date  . Coronary artery disease    status post coronary artery bypass grafting x3 in 1999  . Hyperlipidemia   . Hypertension   . Renal artery stenosis (HCC)   . S/P CABG (coronary artery bypass graft) 1999   x3  . Type 2 diabetes mellitus (HCC)     Past Surgical History:  Procedure Laterality Date  . CARDIAC CATHETERIZATION  04/17/2007   L main 50-60% ostital stenosis, patent LAD, 90% stenosis at L Cfx, dominant RCA with 60-70% segmental stenosis, patent LIMA-LAD, patent free LIMA-OM, patent VG-PDA (Dr. Erlene Quan)  . CARDIAC CATHETERIZATION  03/02/2003   L main with 50% osital stenosis, LAD totally occluded after 2nd diagonal, L Cfx with 90% ostial OM, RCA with 70-80% segmental stenosis, patent LIMA-LAD, patent free RIMA-Cfx marginal, patent VG to PDA (Dr. Erlene Quan)  .  CARDIAC CATHETERIZATION  05/18/1998   significant ostial Cfx & OM & mid LAD disease - subsequent CABG (Dr. Erlene Quan)  . CAROTID DOPPLER  03/2006   right bulb with 0-49% diameter reduction  . CORONARY ARTERY BYPASS GRAFT  05/19/1998   LIMA to LAD, free RIMA to OM, VG to distal RCA  . NM MYOCAR PERF WALL MOTION  04/2011   lexiscan - fixed paical and basal-mid lateral defect, no reversible ischemia; EF 58%; PVCs noted; low risk  . RENAL DOPPLER  09/2012   right renal artery 60-99% diameter reduction, left renal artery 1-59% diameter reduction  . TOTAL KNEE ARTHROPLASTY Bilateral    Dr. Hilda Lias    Family History  Problem Relation Age of Onset  . Stroke Mother     Social History Social History   Tobacco Use  . Smoking status: Never Smoker  . Smokeless tobacco: Never Used  Substance Use Topics  . Alcohol use: No  . Drug use: No    No Known Allergies  Current Outpatient Medications  Medication Sig Dispense Refill  . amLODipine (NORVASC) 5 MG tablet Take 5 mg by mouth daily.    . Ascorbic Acid (VITAMIN C) 1000 MG tablet Take 1,000 mg by mouth daily.    Marland Kitchen aspirin EC 81 MG tablet Take 1 tablet (81 mg total) by mouth daily. 90 tablet 3  . atorvastatin (LIPITOR) 80 MG tablet TAKE ONE (1) TABLET EACH DAY    .  ELIQUIS 5 MG TABS tablet TAKE ONE TABLET BY MOUTH TWICE DAILY 180 tablet 1  . hydrochlorothiazide (HYDRODIURIL) 25 MG tablet Take 25 mg by mouth daily.    Marland Kitchen losartan-hydrochlorothiazide (HYZAAR) 100-25 MG per tablet Take 1 tablet by mouth daily.    . meloxicam (MOBIC) 7.5 MG tablet TAKE ONE (1) TABLET EACH DAY    . metoprolol succinate (TOPROL-XL) 50 MG 24 hr tablet TAKE 1 AND 1/2 TABLETS DAILY    . PARoxetine (PAXIL) 10 MG tablet Take 10 mg by mouth daily.    . predniSONE (DELTASONE) 20 MG tablet 2 po at sametime daily for 5 days 10 tablet 0   No current facility-administered medications for this visit.     Physical Exam  Blood pressure 139/74, pulse 73, height 5\' 4"  (1.626  m), weight 152 lb (68.9 kg).  Constitutional: overall normal hygiene, normal nutrition, well developed, normal grooming, normal body habitus. Assistive device:cane  Musculoskeletal: gait and station Limp left, muscle tone and strength are normal, no tremors or atrophy is present.  .  Neurological: coordination overall normal.  Deep tendon reflex/nerve stretch intact.  Sensation normal.  Cranial nerves II-XII intact.   Skin:   Normal overall no scars, lesions, ulcers or rashes. No psoriasis.  Psychiatric: Alert and oriented x 3.  Recent memory intact, remote memory unclear.  Normal mood and affect. Well groomed.  Good eye contact.  Cardiovascular: overall no swelling, no varicosities, no edema bilaterally, normal temperatures of the legs and arms, no clubbing, cyanosis and good capillary refill.  Lymphatic: palpation is normal.  Left knee has ROM 0 to 100 with pain, crepitus, knock-knee deformity, limp left, uses cane, lateral instability, NV intact.  No distal edema.  Right knee has well healed midline scar, full ROM, no pain, no crepitus. All other systems reviewed and are negative   The patient has been educated about the nature of the problem(s) and counseled on treatment options.  The patient appeared to understand what I have discussed and is in agreement with it.   X-rays were done of the left knee, reported separately.  Encounter Diagnosis  Name Primary?  . Chronic pain of left knee Yes    PLAN Call if any problems.  Precautions discussed.  Continue current medications.   Return to clinic 1 month   PROCEDURE NOTE:  The patient requests injections of the left knee , verbal consent was obtained.  The left knee was prepped appropriately after time out was performed.   Sterile technique was observed and injection of 1 cc of Depo-Medrol 40 mg with several cc's of plain xylocaine. Anesthesia was provided by ethyl chloride and a 20-gauge needle was used to inject the knee  area. The injection was tolerated well.  A band aid dressing was applied.  The patient was advised to apply ice later today and tomorrow to the injection sight as needed.  Get the Covid Vaccine.  Consider total knee on the left.  Electronically Signed Sanjuana Kava, MD 2/9/20219:56 AM

## 2019-08-15 ENCOUNTER — Other Ambulatory Visit: Payer: Self-pay | Admitting: Family Medicine

## 2019-08-25 ENCOUNTER — Ambulatory Visit: Payer: Medicare Other | Admitting: Orthopaedic Surgery

## 2019-09-09 ENCOUNTER — Other Ambulatory Visit: Payer: Self-pay | Admitting: Cardiovascular Disease

## 2019-09-09 ENCOUNTER — Other Ambulatory Visit: Payer: Self-pay | Admitting: Physician Assistant

## 2019-09-09 DIAGNOSIS — I480 Paroxysmal atrial fibrillation: Secondary | ICD-10-CM

## 2019-09-29 ENCOUNTER — Ambulatory Visit (INDEPENDENT_AMBULATORY_CARE_PROVIDER_SITE_OTHER): Payer: Medicare Other | Admitting: *Deleted

## 2019-09-29 ENCOUNTER — Ambulatory Visit: Payer: Medicare Other

## 2019-09-29 DIAGNOSIS — Z0000 Encounter for general adult medical examination without abnormal findings: Secondary | ICD-10-CM | POA: Diagnosis not present

## 2019-09-29 NOTE — Patient Instructions (Signed)
Preventive Care 79 Years and Older, Female Preventive care refers to lifestyle choices and visits with your health care provider that can promote health and wellness. This includes:  A yearly physical exam. This is also called an annual well check.  Regular dental and eye exams.  Immunizations.  Screening for certain conditions.  Healthy lifestyle choices, such as diet and exercise. What can I expect for my preventive care visit? Physical exam Your health care provider will check:  Height and weight. These may be used to calculate body mass index (BMI), which is a measurement that tells if you are at a healthy weight.  Heart rate and blood pressure.  Your skin for abnormal spots. Counseling Your health care provider may ask you questions about:  Alcohol, tobacco, and drug use.  Emotional well-being.  Home and relationship well-being.  Sexual activity.  Eating habits.  History of falls.  Memory and ability to understand (cognition).  Work and work Statistician.  Pregnancy and menstrual history. What immunizations do I need?  Influenza (flu) vaccine  This is recommended every year. Tetanus, diphtheria, and pertussis (Tdap) vaccine  You may need a Td booster every 10 years. Varicella (chickenpox) vaccine  You may need this vaccine if you have not already been vaccinated. Zoster (shingles) vaccine  You may need this after age 33. Pneumococcal conjugate (PCV13) vaccine  One dose is recommended after age 33. Pneumococcal polysaccharide (PPSV23) vaccine  One dose is recommended after age 72. Measles, mumps, and rubella (MMR) vaccine  You may need at least one dose of MMR if you were born in 1957 or later. You may also need a second dose. Meningococcal conjugate (MenACWY) vaccine  You may need this if you have certain conditions. Hepatitis A vaccine  You may need this if you have certain conditions or if you travel or work in places where you may be exposed  to hepatitis A. Hepatitis B vaccine  You may need this if you have certain conditions or if you travel or work in places where you may be exposed to hepatitis B. Haemophilus influenzae type b (Hib) vaccine  You may need this if you have certain conditions. You may receive vaccines as individual doses or as more than one vaccine together in one shot (combination vaccines). Talk with your health care provider about the risks and benefits of combination vaccines. What tests do I need? Blood tests  Lipid and cholesterol levels. These may be checked every 5 years, or more frequently depending on your overall health.  Hepatitis C test.  Hepatitis B test. Screening  Lung cancer screening. You may have this screening every year starting at age 39 if you have a 30-pack-year history of smoking and currently smoke or have quit within the past 15 years.  Colorectal cancer screening. All adults should have this screening starting at age 36 and continuing until age 15. Your health care provider may recommend screening at age 23 if you are at increased risk. You will have tests every 1-10 years, depending on your results and the type of screening test.  Diabetes screening. This is done by checking your blood sugar (glucose) after you have not eaten for a while (fasting). You may have this done every 1-3 years.  Mammogram. This may be done every 1-2 years. Talk with your health care provider about how often you should have regular mammograms.  BRCA-related cancer screening. This may be done if you have a family history of breast, ovarian, tubal, or peritoneal cancers.  Other tests  Sexually transmitted disease (STD) testing.  Bone density scan. This is done to screen for osteoporosis. You may have this done starting at age 29. Follow these instructions at home: Eating and drinking  Eat a diet that includes fresh fruits and vegetables, whole grains, lean protein, and low-fat dairy products. Limit  your intake of foods with high amounts of sugar, saturated fats, and salt.  Take vitamin and mineral supplements as recommended by your health care provider.  Do not drink alcohol if your health care provider tells you not to drink.  If you drink alcohol: ? Limit how much you have to 0-1 drink a day. ? Be aware of how much alcohol is in your drink. In the [place] one drink equals one 12 oz bottle of beer (355 mL), one 5 oz glass of wine (148 mL), or one 1 oz glass of hard liquor (44 mL). Lifestyle  Take daily care of your teeth and gums.  Stay active. Exercise for at least 30 minutes on 5 or more days each week.  Do not use any products that contain nicotine or tobacco, such as cigarettes, e-cigarettes, and chewing tobacco. If you need help quitting, ask your health care provider.  If you are sexually active, practice safe sex. Use a condom or other form of protection in order to prevent STIs (sexually transmitted infections).  Talk with your health care provider about taking a low-dose aspirin or statin. What's next?  Go to your health care provider once a year for a well check visit.  Ask your health care provider how often you should have your eyes and teeth checked.  Stay up to date on all vaccines. This information is not intended to replace advice given to you by your health care provider. Make sure you discuss any questions you have with your health care provider. Document Revised: 05/29/2018 Document Reviewed: 05/29/2018 Elsevier Patient Education  2020 [employer].

## 2019-09-29 NOTE — Progress Notes (Signed)
MEDICARE ANNUAL WELLNESS VISIT  09/29/2019  Telephone Visit Disclaimer This Medicare AWV was conducted by telephone due to national recommendations for restrictions regarding the COVID-19 Pandemic (e.g. social distancing).  I verified, using two identifiers, that I am speaking with Virginia [name] or their authorized healthcare agent. I discussed the limitations, risks, security, and privacy concerns of performing an evaluation and management service by telephone and the potential availability of an in-person appointment in the future. The patient expressed understanding and agreed to proceed.   Subjective:  Virginia [name] is a 79 y.o. female patient of Hawks, Edilia Bo, FNP who had a Medicare Annual Wellness Visit today via telephone. Virginia [name] is Retired and lives with their daughter who has Downs Syndrome. she has 2 living children and 1 son who died from a heart attack 5 years ago. she reports that she is socially active and does interact with friends/family regularly. she is minimally physically active and enjoys sewing and yard work.  Patient Care Team: Junie Spencer, FNP as PCP - General (Family Medicine)  Advanced Directives 09/29/2019 07/14/2017  Does Patient Have a Medical Advance Directive? Yes Yes  Type of Estate agent of [place];Living will Healthcare Power of Attorney  Does patient want to make changes to medical advance directive? No - Patient declined -  Copy of Healthcare Power of Attorney in Chart? No - copy requested [facility] Utilization Over the Past 12 Months: # of hospitalizations or ER visits: 0 # of surgeries: 0  Review of Systems    Patient reports that her overall health is unchanged compared to last year.  History obtained from chart review  Patient Reported Readings (BP, Pulse, CBG, Weight, etc) none  Pain Assessment       Current Medications & Allergies (verified) Allergies as of 09/29/2019   No Known Allergies     Medication List       Accurate as of September 29, 2019  9:38 AM. If you have any questions, ask your nurse or doctor.        STOP taking these medications   predniSONE 20 MG tablet Commonly known as: Deltasone     TAKE these medications   amLODipine 5 MG tablet Commonly known as: NORVASC Take 5 mg by mouth daily.   aspirin EC 81 MG tablet Take 1 tablet (81 mg total) by mouth daily.   atorvastatin 80 MG tablet Commonly known as: LIPITOR TAKE ONE (1) TABLET EACH DAY   Eliquis 5 MG Tabs tablet Generic drug: apixaban TAKE ONE TABLET BY MOUTH TWICE DAILY   hydrochlorothiazide 25 MG tablet Commonly known as: HYDRODIURIL Take 25 mg by mouth daily.   losartan-hydrochlorothiazide 100-25 MG tablet Commonly known as: HYZAAR Take 1 tablet by mouth daily.   meloxicam 7.5 MG tablet Commonly known as: MOBIC TAKE ONE (1) TABLET EACH DAY   metoprolol succinate 50 MG 24 hr tablet Commonly known as: TOPROL-XL TAKE 1 AND 1/2 TABLETS DAILY   PARoxetine 10 MG tablet Commonly known as: PAXIL TAKE ONE TABLET EVERY MORNING   vitamin C 1000 MG tablet Take 1,000 mg by mouth daily.       History (reviewed): Past Medical History:  Diagnosis Date  . Coronary artery disease    status post coronary artery bypass grafting x3 in 1999  . Hyperlipidemia   . Hypertension   . Renal artery stenosis (HCC)   . S/P CABG (coronary artery bypass graft) 1999   x3  .  Type 2 diabetes mellitus (HCC)    Past Surgical History:  Procedure Laterality Date  . CARDIAC CATHETERIZATION  04/17/2007   L main 50-60% ostital stenosis, patent LAD, 90% stenosis at L Cfx, dominant RCA with 60-70% segmental stenosis, patent LIMA-LAD, patent free LIMA-OM, patent VG-PDA (Dr. Erlene Quan)  . CARDIAC CATHETERIZATION  03/02/2003   L main with 50% osital stenosis, LAD totally occluded after 2nd diagonal, L Cfx with 90% ostial OM, RCA with 70-80% segmental stenosis, patent LIMA-LAD, patent free RIMA-Cfx marginal, patent  VG to PDA (Dr. Erlene Quan)  . CARDIAC CATHETERIZATION  05/18/1998   significant ostial Cfx & OM & mid LAD disease - subsequent CABG (Dr. Erlene Quan)  . CAROTID DOPPLER  03/2006   right bulb with 0-49% diameter reduction  . CORONARY ARTERY BYPASS GRAFT  05/19/1998   LIMA to LAD, free RIMA to OM, VG to distal RCA  . NM MYOCAR PERF WALL MOTION  04/2011   lexiscan - fixed paical and basal-mid lateral defect, no reversible ischemia; EF 58%; PVCs noted; low risk  . RENAL DOPPLER  09/2012   right renal artery 60-99% diameter reduction, left renal artery 1-59% diameter reduction  . TOTAL KNEE ARTHROPLASTY Right    Dr. Hilda Lias   Family History  Problem Relation Age of Onset  . Stroke Mother   . Heart attack Son    Social History   Socioeconomic History  . Marital status: Widowed    Spouse name: Not on file  . Number of children: 3  . Years of education: 59  . Highest education level: High school graduate  Occupational History  . Occupation: retired  Tobacco Use  . Smoking status: Never Smoker  . Smokeless tobacco: Never Used  Substance and Sexual Activity  . Alcohol use: No  . Drug use: No  . Sexual activity: Not Currently    Birth control/protection: Post-menopausal  Other Topics Concern  . Not on file  Social History Narrative  . Not on file   Social Determinants of Health   Financial Resource Strain: Low Risk   . Difficulty of Paying Living Expenses: Not hard at all  Food Insecurity: No Food Insecurity  . Worried About Programme researcher, broadcasting/film/video in the Last Year: Never true  . Ran Out of Food in the Last Year: Never true  Transportation Needs: No Transportation Needs  . Lack of Transportation (Medical): No  . Lack of Transportation (Non-Medical): No  Physical Activity: Inactive  . Days of Exercise per Week: 0 days  . Minutes of Exercise per Session: 0 min  Stress: No Stress Concern Present  . Feeling of Stress : Not at all  Social Connections: Slightly Isolated  . Frequency of  Communication with Friends and Family: More than three times a week  . Frequency of Social Gatherings with Friends and Family: More than three times a week  . Attends Religious Services: More than 4 times per year  . Active Member of Clubs or Organizations: Yes  . Attends Banker Meetings: More than 4 times per year  . Marital Status: Widowed    Activities of Daily Living In your present state of health, do you have any difficulty performing the following activities: 09/29/2019  Hearing? N  Vision? N  Comment wears glasses-overdue for diabetic eye exam-pt will schedule  Difficulty concentrating or making decisions? N  Walking or climbing stairs? N  Dressing or bathing? N  Doing errands, shopping? N  Preparing Food and eating ? N  Using the Toilet? N  In the past six months, have you accidently leaked urine? N  Do you have problems with loss of bowel control? N  Managing your Medications? N  Managing your Finances? N  Housekeeping or managing your Housekeeping? N  Some recent data might be hidden    Patient Education/ Literacy How often do you need to have someone help you when you read instructions, pamphlets, or other written materials from your doctor or pharmacy?: 1 - Never What is the last grade level you completed in school?: 12th grade  Exercise Current Exercise Habits: The patient does not participate in regular exercise at present, Exercise limited by: cardiac condition(s)  Diet Patient reports consuming 3 meals a day and 0 snack(s) a day Patient reports that her primary diet is: Regular Patient reports that she does have regular access to food.   Depression Screen PHQ 2/9 Scores 09/29/2019 07/16/2019 03/06/2019 01/07/2019  PHQ - 2 Score 0 0 0 0  PHQ- 9 Score 1 0 - -     Fall Risk Fall Risk  09/29/2019 07/16/2019 03/06/2019 01/07/2019  Falls in the past year? 0 1 1 0  Number falls in past yr: - 0 0 -  Injury with Fall? - 1 1 -  Follow up - Falls  prevention discussed - -     Objective:  Virginia [name] seemed alert and oriented and she participated appropriately during our telephone visit.  Blood Pressure Weight BMI  BP Readings from Last 3 Encounters:  07/28/19 139/74  07/16/19 138/78  03/06/19 118/60   Wt Readings from Last 3 Encounters:  07/28/19 152 lb (68.9 kg)  07/16/19 154 lb 12.8 oz (70.2 kg)  03/06/19 152 lb (68.9 kg)   BMI Readings from Last 1 Encounters:  07/28/19 26.09 kg/m    *Unable to obtain current vital signs, weight, and BMI due to telephone visit type  Hearing/Vision  . Lilliona did not seem to have difficulty with hearing/understanding during the telephone conversation . Reports that she has not had a formal eye exam by an eye care professional within the past year . Reports that she has not had a formal hearing evaluation within the past year *Unable to fully assess hearing and vision during telephone visit type  Cognitive Function: 6CIT Screen 09/29/2019  What Year? 0 points  What month? 0 points  What time? 0 points  Count back from 20 0 points  Months in reverse 4 points  Repeat phrase 2 points  Total Score 6   (Normal:0-7, Significant for Dysfunction: >8)  Normal Cognitive Function Screening: Yes   Immunization & Health Maintenance Record Immunization History  Administered Date(s) Administered  . Fluad Quad(high Dose 65+) 03/06/2019  . Influenza, High Dose Seasonal PF 04/07/2015  . Influenza,inj,Quad PF,6+ Mos 04/18/2016, 04/08/2017, 03/26/2018  . Influenza,trivalent, recombinat, inj, PF 04/12/2014  . Pneumococcal Conjugate-13 07/27/2016  . Pneumococcal Polysaccharide-23 12/25/2017    Health Maintenance  Topic Date Due  . FOOT EXAM  Never done  . OPHTHALMOLOGY EXAM  Never done  . TETANUS/TDAP  Never done  . DEXA SCAN  Never done  . HEMOGLOBIN A1C  05/09/2008  . INFLUENZA VACCINE  01/17/2020  . PNA vac Low Risk Adult  Completed       Assessment  This is a routine wellness  examination for [employer].  Health Maintenance: Due or Overdue Health Maintenance Due  Topic Date Due  . FOOT EXAM  Never done  . OPHTHALMOLOGY EXAM  Never  done  . TETANUS/TDAP  Never done  . DEXA SCAN  Never done  . HEMOGLOBIN A1C  05/09/2008    [facility] does not need a referral for Community Assistance: Care Management:   no Social Work:    no Prescription Assistance:  no Nutrition/Diabetes Education:  no   Plan:  Personalized Goals Goals Addressed            This Visit's Progress   . DIET - INCREASE WATER INTAKE       Try to drink 6-8 glasses of water daily.      Personalized Health Maintenance & Screening Recommendations  Td vaccine Advanced directives: has an advanced directive - a copy HAS NOT been provided. Shingles vaccine  Lung Cancer Screening Recommended: no (Low Dose CT Chest recommended if Age 34-80 years, 30 pack-year currently smoking OR have quit w/in past 15 years) Hepatitis C Screening recommended: no HIV Screening recommended: no  Advanced Directives: Written information was not prepared per patient's request.  Referrals & Orders No orders of the defined types were placed in this encounter.   Follow-up Plan . Follow-up with Sharion Balloon, FNP as planned . Schedule your Eye Exam as discussed . Consider TDAP and Shingrix vaccines at your next visit with your PCP . Bring a copy of your Advanced Directives in for our records   I have personally reviewed and noted the following in the patient's chart:   . Medical and social history . Use of alcohol, tobacco or illicit drugs  . Current medications and supplements . Functional ability and status . Nutritional status . Physical activity . Advanced directives . List of other physicians . Hospitalizations, surgeries, and ER visits in previous 12 months . Vitals . Screenings to include cognitive, depression, and falls . Referrals and appointments  In addition, I have reviewed  and discussed with Maryelizabeth Kaufmann certain preventive protocols, quality metrics, and best practice recommendations. A written personalized care plan for preventive services as well as general preventive health recommendations is available and can be mailed to the patient at her request.      Milas Hock, LPN  [id number]/50/[id number]

## 2019-10-13 ENCOUNTER — Other Ambulatory Visit: Payer: Self-pay | Admitting: Family

## 2019-11-09 ENCOUNTER — Other Ambulatory Visit: Payer: Self-pay | Admitting: Cardiovascular Disease

## 2019-11-09 NOTE — Telephone Encounter (Signed)
Rx request sent to pharmacy.  

## 2019-11-18 ENCOUNTER — Other Ambulatory Visit: Payer: Self-pay

## 2019-11-18 ENCOUNTER — Encounter: Payer: Self-pay | Admitting: Family

## 2019-11-18 ENCOUNTER — Ambulatory Visit (INDEPENDENT_AMBULATORY_CARE_PROVIDER_SITE_OTHER): Payer: Medicare Other | Admitting: Family

## 2019-11-18 VITALS — BP 122/68 | HR 60 | Temp 97.6°F | Ht 64.0 in | Wt 152.8 lb

## 2019-11-18 DIAGNOSIS — E1159 Type 2 diabetes mellitus with other circulatory complications: Secondary | ICD-10-CM

## 2019-11-18 DIAGNOSIS — I10 Essential (primary) hypertension: Secondary | ICD-10-CM

## 2019-11-18 DIAGNOSIS — M1990 Unspecified osteoarthritis, unspecified site: Secondary | ICD-10-CM | POA: Insufficient documentation

## 2019-11-18 DIAGNOSIS — M8949 Other hypertrophic osteoarthropathy, multiple sites: Secondary | ICD-10-CM

## 2019-11-18 DIAGNOSIS — E1169 Type 2 diabetes mellitus with other specified complication: Secondary | ICD-10-CM

## 2019-11-18 DIAGNOSIS — I2583 Coronary atherosclerosis due to lipid rich plaque: Secondary | ICD-10-CM

## 2019-11-18 DIAGNOSIS — I480 Paroxysmal atrial fibrillation: Secondary | ICD-10-CM

## 2019-11-18 DIAGNOSIS — I2510 Atherosclerotic heart disease of native coronary artery without angina pectoris: Secondary | ICD-10-CM

## 2019-11-18 DIAGNOSIS — I152 Hypertension secondary to endocrine disorders: Secondary | ICD-10-CM

## 2019-11-18 DIAGNOSIS — I482 Chronic atrial fibrillation, unspecified: Secondary | ICD-10-CM | POA: Diagnosis not present

## 2019-11-18 DIAGNOSIS — E782 Mixed hyperlipidemia: Secondary | ICD-10-CM

## 2019-11-18 DIAGNOSIS — M159 Polyosteoarthritis, unspecified: Secondary | ICD-10-CM

## 2019-11-18 DIAGNOSIS — K219 Gastro-esophageal reflux disease without esophagitis: Secondary | ICD-10-CM

## 2019-11-18 DIAGNOSIS — Z780 Asymptomatic menopausal state: Secondary | ICD-10-CM

## 2019-11-18 DIAGNOSIS — F331 Major depressive disorder, recurrent, moderate: Secondary | ICD-10-CM

## 2019-11-18 LAB — BAYER DCA HB A1C WAIVED: HB A1C (BAYER DCA - WAIVED): 6.8 % (ref <7.0)

## 2019-11-18 MED ORDER — APIXABAN 5 MG PO TABS: 5.0000 mg | ORAL_TABLET | Freq: Two times a day (BID) | ORAL | 1 refills | Status: DC

## 2019-11-18 MED ORDER — METOPROLOL SUCCINATE ER 50 MG PO TB24: 75.0000 mg | ORAL_TABLET | Freq: Every day | ORAL | 3 refills | Status: DC

## 2019-11-18 MED ORDER — LOSARTAN POTASSIUM-HCTZ 100-25 MG PO TABS: 1.0000 | ORAL_TABLET | Freq: Every day | ORAL | 0 refills | Status: DC

## 2019-11-18 MED ORDER — HYDROCHLOROTHIAZIDE 25 MG PO TABS: 25.0000 mg | ORAL_TABLET | Freq: Every day | ORAL | 2 refills | Status: DC

## 2019-11-18 MED ORDER — PAROXETINE HCL 10 MG PO TABS: 10.0000 mg | ORAL_TABLET | Freq: Every morning | ORAL | 2 refills | Status: DC

## 2019-11-18 MED ORDER — ATORVASTATIN CALCIUM 80 MG PO TABS: ORAL_TABLET | ORAL | 1 refills | Status: DC

## 2019-11-18 MED ORDER — AMLODIPINE BESYLATE 5 MG PO TABS: 5.0000 mg | ORAL_TABLET | Freq: Every day | ORAL | 3 refills | Status: DC

## 2019-11-18 MED ORDER — MELOXICAM 7.5 MG PO TABS: ORAL_TABLET | ORAL | 2 refills | Status: DC

## 2019-11-18 NOTE — Patient Instructions (Signed)
Health Maintenance After Age 79 After age 79, you are at a higher risk for certain long-term diseases and infections as well as injuries from falls. Falls are a major cause of broken bones and head injuries in people who are older than age 79. Getting regular preventive care can help to keep you healthy and well. Preventive care includes getting regular testing and making lifestyle changes as recommended by your health care provider. Talk with your health care provider about:  Which screenings and tests you should have. A screening is a test that checks for a disease when you have no symptoms.  A diet and exercise plan that is right for you. What should I know about screenings and tests to prevent falls? Screening and testing are the best ways to find a health problem early. Early diagnosis and treatment give you the best chance of managing medical conditions that are common after age 79. Certain conditions and lifestyle choices may make you more likely to have a fall. Your health care provider may recommend:  Regular vision checks. Poor vision and conditions such as cataracts can make you more likely to have a fall. If you wear glasses, make sure to get your prescription updated if your vision changes.  Medicine review. Work with your health care provider to regularly review all of the medicines you are taking, including over-the-counter medicines. Ask your health care provider about any side effects that may make you more likely to have a fall. Tell your health care provider if any medicines that you take make you feel dizzy or sleepy.  Osteoporosis screening. Osteoporosis is a condition that causes the bones to get weaker. This can make the bones weak and cause them to break more easily.  Blood pressure screening. Blood pressure changes and medicines to control blood pressure can make you feel dizzy.  Strength and balance checks. Your health care provider may recommend certain tests to check your  strength and balance while standing, walking, or changing positions.  Foot health exam. Foot pain and numbness, as well as not wearing proper footwear, can make you more likely to have a fall.  Depression screening. You may be more likely to have a fall if you have a fear of falling, feel emotionally low, or feel unable to do activities that you used to do.  Alcohol use screening. Using too much alcohol can affect your balance and may make you more likely to have a fall. What actions can I take to lower my risk of falls? General instructions  Talk with your health care provider about your risks for falling. Tell your health care provider if: ? You fall. Be sure to tell your health care provider about all falls, even ones that seem minor. ? You feel dizzy, sleepy, or off-balance.  Take over-the-counter and prescription medicines only as told by your health care provider. These include any supplements.  Eat a healthy diet and maintain a healthy weight. A healthy diet includes low-fat dairy products, low-fat (lean) meats, and fiber from whole grains, beans, and lots of fruits and vegetables. Home safety  Remove any tripping hazards, such as rugs, cords, and clutter.  Install safety equipment such as grab bars in bathrooms and safety rails on stairs.  Keep rooms and walkways well-lit. Activity   Follow a regular exercise program to stay fit. This will help you maintain your balance. Ask your health care provider what types of exercise are appropriate for you.  If you need a cane or   walker, use it as recommended by your health care provider.  Wear supportive shoes that have nonskid soles. Lifestyle  Do not drink alcohol if your health care provider tells you not to drink.  If you drink alcohol, limit how much you have: ? 0-1 drink a day for women. ? 0-2 drinks a day for men.  Be aware of how much alcohol is in your drink. In the [place] one drink equals one typical bottle of beer (12  oz), one-half glass of wine (5 oz), or one shot of hard liquor (1 oz).  Do not use any products that contain nicotine or tobacco, such as cigarettes and e-cigarettes. If you need help quitting, ask your health care provider. Summary  Having a healthy lifestyle and getting preventive care can help to protect your health and wellness after age 79.  Screening and testing are the best way to find a health problem early and help you avoid having a fall. Early diagnosis and treatment give you the best chance for managing medical conditions that are more common for people who are older than age 79.  Falls are a major cause of broken bones and head injuries in people who are older than age 79. Take precautions to prevent a fall at home.  Work with your health care provider to learn what changes you can make to improve your health and wellness and to prevent falls. This information is not intended to replace advice given to you by your health care provider. Make sure you discuss any questions you have with your health care provider. Document Revised: 09/25/2018 Document Reviewed: 04/17/2017 Elsevier Patient Education  2020 Elsevier Inc.  

## 2019-11-18 NOTE — Progress Notes (Signed)
Subjective:    Patient ID: Virginia [name], female    DOB: 1941/02/23, 79 y.o.   MRN: [id number]  Chief Complaint  Patient presents with  . Medical Management of Chronic Issues   PT presents to the office today to establish care with me. She states has a Cardiologists for CAD,& A Fib, but has not seen him in a few years. Pt does take Eliquis BID.  Diabetes She presents for her follow-up diabetic visit. She has type 2 diabetes mellitus. Her disease course has been stable. There are no hypoglycemic associated symptoms. Pertinent negatives for diabetes include no blurred vision and no foot paresthesias. Diabetic complications include heart disease. Pertinent negatives for diabetic complications include no CVA, nephropathy or peripheral neuropathy. Risk factors for coronary artery disease include family history, dyslipidemia, diabetes mellitus, hypertension and sedentary lifestyle. She is following a generally unhealthy diet. Her overall blood glucose range is 110-130 mg/dl. Eye exam is not current.  Hypertension This is a chronic problem. The current episode started more than 1 year ago. The problem has been resolved since onset. The problem is controlled. Pertinent negatives include no blurred vision, malaise/fatigue, peripheral edema or shortness of breath. The current treatment provides moderate improvement. There is no history of CVA.  Hyperlipidemia This is a chronic problem. The current episode started more than 1 year ago. The problem is controlled. Recent lipid tests were reviewed and are normal. Pertinent negatives include no shortness of breath. Current antihyperlipidemic treatment includes statins. The current treatment provides moderate improvement of lipids. Risk factors for coronary artery disease include dyslipidemia, hypertension, a sedentary lifestyle and post-menopausal.  Arthritis Presents for follow-up visit. She complains of pain and stiffness. The symptoms have been stable.  Affected locations include the right knee and left knee. Her pain is at a severity of 8/10.  Gastroesophageal Reflux She complains of belching and heartburn. This is a chronic problem. The current episode started more than 1 year ago. She has tried a PPI for the symptoms. The treatment provided moderate relief.      Review of Systems  Constitutional: Negative for malaise/fatigue.  Eyes: Negative for blurred vision.  Respiratory: Negative for shortness of breath.   Gastrointestinal: Positive for heartburn.  Musculoskeletal: Positive for arthritis and stiffness.  All other systems reviewed and are negative.      Objective:   Physical Exam Vitals reviewed.  Constitutional:      General: She is not in acute distress.    Appearance: She is well-developed.  HENT:     Head: Normocephalic and atraumatic.     Right Ear: Tympanic membrane normal.     Left Ear: Tympanic membrane normal.  Eyes:     Pupils: Pupils are equal, round, and reactive to light.  Neck:     Thyroid: No thyromegaly.  Cardiovascular:     Rate and Rhythm: Normal rate and regular rhythm.     Heart sounds: Normal heart sounds. No murmur.  Pulmonary:     Effort: Pulmonary effort is normal. No respiratory distress.     Breath sounds: Normal breath sounds. No wheezing.  Abdominal:     General: Bowel sounds are normal. There is no distension.     Palpations: Abdomen is soft.     Tenderness: There is no abdominal tenderness.  Musculoskeletal:        General: No tenderness. Normal range of motion.     Cervical back: Normal range of motion and neck supple.  Skin:    General: Skin  is warm and dry.  Neurological:     Mental Status: She is alert and oriented to person, place, and time.     Cranial Nerves: No cranial nerve deficit.     Deep Tendon Reflexes: Reflexes are normal and symmetric.  Psychiatric:        Behavior: Behavior normal.        Thought Content: Thought content normal.        Judgment: Judgment  normal.    Diabetic Foot Exam - Simple   Simple Foot Form Diabetic Foot exam was performed with the following findings: Yes 11/18/2019  3:25 PM  Visual Inspection No deformities, no ulcerations, no other skin breakdown bilaterally: Yes See comments: Yes Sensation Testing Intact to touch and monofilament testing bilaterally: Yes Pulse Check Posterior Tibialis and Dorsalis pulse intact bilaterally: Yes Comments Right second toe curling inward (hammer toe) with erythemas on second joint. Both great toenails thick and discolored.       BP 122/68   Pulse 60   Temp 97.6 F (36.4 C) (Temporal)   Ht '5\' 4"'  (1.626 m)   Wt 152 lb 12.8 oz (69.3 kg)   SpO2 97%   BMI 26.23 kg/m      Assessment & Plan:  Virginia [name] comes in today with chief complaint of Medical Management of Chronic Issues   Diagnosis and orders addressed:  1. Chronic atrial fibrillation (HCC) - metoprolol succinate (TOPROL-XL) 50 MG 24 hr tablet; Take 1.5 tablets (75 mg total) by mouth daily. Take with or immediately following a meal.  Dispense: 90 tablet; Refill: 3 - CMP14+EGFR  2. Coronary artery disease due to lipid rich plaque - metoprolol succinate (TOPROL-XL) 50 MG 24 hr tablet; Take 1.5 tablets (75 mg total) by mouth daily. Take with or immediately following a meal.  Dispense: 90 tablet; Refill: 3 - CMP14+EGFR  3. Paroxysmal atrial fibrillation (HCC) - apixaban (ELIQUIS) 5 MG TABS tablet; Take 1 tablet (5 mg total) by mouth 2 (two) times daily.  Dispense: 180 tablet; Refill: 1 - CMP14+EGFR  4. Hypertension associated with diabetes ([address]) - CMP14+EGFR  5. Gastroesophageal reflux disease, unspecified whether esophagitis present - CMP14+EGFR  6. Mixed hyperlipidemia - CMP14+EGFR  7. Moderate episode of recurrent major depressive disorder (HCC) - CMP14+EGFR  8. Type 2 diabetes mellitus with other specified complication, without long-term current use of insulin (HCC) - Bayer DCA Hb A1c Waived -  CMP14+EGFR - Microalbumin / creatinine urine ratio  9. Primary osteoarthritis involving multiple joints  - CMP14+EGFR  10. Post-menopause - DG [facility] DEXA   Labs pending Health Maintenance reviewed Diet and exercise encouraged  Follow up plan: 6 months   Evelina Dun, FNP

## 2019-11-19 LAB — CMP14+EGFR
ALT: 9 IU/L (ref 0–32)
AST: 20 IU/L (ref 0–40)
Albumin/Globulin Ratio: 2.3 — ABNORMAL HIGH (ref 1.2–2.2)
Albumin: 4.3 g/dL (ref 3.7–4.7)
Alkaline Phosphatase: 114 IU/L (ref 48–121)
BUN/Creatinine Ratio: 16 (ref 12–28)
BUN: 14 mg/dL (ref 8–27)
Bilirubin Total: 0.8 mg/dL (ref 0.0–1.2)
CO2: 28 mmol/L (ref 20–29)
Calcium: 9.5 mg/dL (ref 8.7–10.3)
Chloride: 100 mmol/L (ref 96–106)
Creatinine, Ser: 0.85 mg/dL (ref 0.57–1.00)
GFR calc Af Amer: 76 mL/min/{1.73_m2} (ref >59)
GFR calc non Af Amer: 66 mL/min/{1.73_m2} (ref >59)
Globulin, Total: 1.9 g/dL (ref 1.5–4.5)
Glucose: 106 mg/dL — ABNORMAL HIGH (ref 65–99)
Potassium: 3.5 mmol/L (ref 3.5–5.2)
Sodium: 140 mmol/L (ref 134–144)
Total Protein: 6.2 g/dL (ref 6.0–8.5)

## 2019-11-19 LAB — MICROALBUMIN / CREATININE URINE RATIO
Creatinine, Urine: 134.2 mg/dL
Microalb/Creat Ratio: 6 mg/g creat (ref 0–29)
Microalbumin, Urine: 8.7 ug/mL

## 2020-01-13 ENCOUNTER — Ambulatory Visit: Payer: Medicare Other | Admitting: Family

## 2020-01-13 ENCOUNTER — Ambulatory Visit: Payer: Medicare Other | Admitting: Physician Assistant

## 2020-01-28 ENCOUNTER — Other Ambulatory Visit: Payer: Self-pay | Admitting: Family

## 2020-03-09 ENCOUNTER — Other Ambulatory Visit: Payer: Self-pay

## 2020-03-09 ENCOUNTER — Ambulatory Visit (INDEPENDENT_AMBULATORY_CARE_PROVIDER_SITE_OTHER): Payer: Medicare Other

## 2020-03-09 DIAGNOSIS — Z780 Asymptomatic menopausal state: Secondary | ICD-10-CM | POA: Diagnosis not present

## 2020-04-20 ENCOUNTER — Other Ambulatory Visit: Payer: Self-pay | Admitting: Family

## 2020-04-27 ENCOUNTER — Other Ambulatory Visit: Payer: Self-pay | Admitting: Family

## 2020-05-19 ENCOUNTER — Encounter: Payer: Self-pay | Admitting: Family

## 2020-05-19 ENCOUNTER — Other Ambulatory Visit: Payer: Self-pay

## 2020-05-19 ENCOUNTER — Ambulatory Visit (INDEPENDENT_AMBULATORY_CARE_PROVIDER_SITE_OTHER): Payer: Medicare Other | Admitting: Family

## 2020-05-19 ENCOUNTER — Ambulatory Visit: Payer: Medicare Other | Admitting: Family

## 2020-05-19 VITALS — BP 123/71 | HR 84 | Temp 98.4°F | Ht 64.0 in | Wt 164.8 lb

## 2020-05-19 DIAGNOSIS — Z23 Encounter for immunization: Secondary | ICD-10-CM

## 2020-05-19 DIAGNOSIS — I482 Chronic atrial fibrillation, unspecified: Secondary | ICD-10-CM

## 2020-05-19 DIAGNOSIS — M159 Polyosteoarthritis, unspecified: Secondary | ICD-10-CM

## 2020-05-19 DIAGNOSIS — F331 Major depressive disorder, recurrent, moderate: Secondary | ICD-10-CM

## 2020-05-19 DIAGNOSIS — I2583 Coronary atherosclerosis due to lipid rich plaque: Secondary | ICD-10-CM | POA: Diagnosis not present

## 2020-05-19 DIAGNOSIS — E1169 Type 2 diabetes mellitus with other specified complication: Secondary | ICD-10-CM

## 2020-05-19 DIAGNOSIS — K219 Gastro-esophageal reflux disease without esophagitis: Secondary | ICD-10-CM

## 2020-05-19 DIAGNOSIS — M8949 Other hypertrophic osteoarthropathy, multiple sites: Secondary | ICD-10-CM

## 2020-05-19 DIAGNOSIS — I152 Hypertension secondary to endocrine disorders: Secondary | ICD-10-CM

## 2020-05-19 DIAGNOSIS — I2510 Atherosclerotic heart disease of native coronary artery without angina pectoris: Secondary | ICD-10-CM

## 2020-05-19 DIAGNOSIS — E1159 Type 2 diabetes mellitus with other circulatory complications: Secondary | ICD-10-CM

## 2020-05-19 DIAGNOSIS — E782 Mixed hyperlipidemia: Secondary | ICD-10-CM

## 2020-05-19 DIAGNOSIS — Z1159 Encounter for screening for other viral diseases: Secondary | ICD-10-CM

## 2020-05-19 LAB — BAYER DCA HB A1C WAIVED: HB A1C (BAYER DCA - WAIVED): 6.6 % (ref <7.0)

## 2020-05-19 NOTE — Patient Instructions (Signed)
Health Maintenance After Age 79 After age 79, you are at a higher risk for certain long-term diseases and infections as well as injuries from falls. Falls are a major cause of broken bones and head injuries in people who are older than age 79. Getting regular preventive care can help to keep you healthy and well. Preventive care includes getting regular testing and making lifestyle changes as recommended by your health care provider. Talk with your health care provider about:  Which screenings and tests you should have. A screening is a test that checks for a disease when you have no symptoms.  A diet and exercise plan that is right for you. What should I know about screenings and tests to prevent falls? Screening and testing are the best ways to find a health problem early. Early diagnosis and treatment give you the best chance of managing medical conditions that are common after age 79. Certain conditions and lifestyle choices may make you more likely to have a fall. Your health care provider may recommend:  Regular vision checks. Poor vision and conditions such as cataracts can make you more likely to have a fall. If you wear glasses, make sure to get your prescription updated if your vision changes.  Medicine review. Work with your health care provider to regularly review all of the medicines you are taking, including over-the-counter medicines. Ask your health care provider about any side effects that may make you more likely to have a fall. Tell your health care provider if any medicines that you take make you feel dizzy or sleepy.  Osteoporosis screening. Osteoporosis is a condition that causes the bones to get weaker. This can make the bones weak and cause them to break more easily.  Blood pressure screening. Blood pressure changes and medicines to control blood pressure can make you feel dizzy.  Strength and balance checks. Your health care provider may recommend certain tests to check your  strength and balance while standing, walking, or changing positions.  Foot health exam. Foot pain and numbness, as well as not wearing proper footwear, can make you more likely to have a fall.  Depression screening. You may be more likely to have a fall if you have a fear of falling, feel emotionally low, or feel unable to do activities that you used to do.  Alcohol use screening. Using too much alcohol can affect your balance and may make you more likely to have a fall. What actions can I take to lower my risk of falls? General instructions  Talk with your health care provider about your risks for falling. Tell your health care provider if: ? You fall. Be sure to tell your health care provider about all falls, even ones that seem minor. ? You feel dizzy, sleepy, or off-balance.  Take over-the-counter and prescription medicines only as told by your health care provider. These include any supplements.  Eat a healthy diet and maintain a healthy weight. A healthy diet includes low-fat dairy products, low-fat (lean) meats, and fiber from whole grains, beans, and lots of fruits and vegetables. Home safety  Remove any tripping hazards, such as rugs, cords, and clutter.  Install safety equipment such as grab bars in bathrooms and safety rails on stairs.  Keep rooms and walkways well-lit. Activity   Follow a regular exercise program to stay fit. This will help you maintain your balance. Ask your health care provider what types of exercise are appropriate for you.  If you need a cane or   walker, use it as recommended by your health care provider.  Wear supportive shoes that have nonskid soles. Lifestyle  Do not drink alcohol if your health care provider tells you not to drink.  If you drink alcohol, limit how much you have: ? 0-1 drink a day for women. ? 0-2 drinks a day for men.  Be aware of how much alcohol is in your drink. In the [place] one drink equals one typical bottle of beer (12  oz), one-half glass of wine (5 oz), or one shot of hard liquor (1 oz).  Do not use any products that contain nicotine or tobacco, such as cigarettes and e-cigarettes. If you need help quitting, ask your health care provider. Summary  Having a healthy lifestyle and getting preventive care can help to protect your health and wellness after age 79.  Screening and testing are the best way to find a health problem early and help you avoid having a fall. Early diagnosis and treatment give you the best chance for managing medical conditions that are more common for people who are older than age 79.  Falls are a major cause of broken bones and head injuries in people who are older than age 79. Take precautions to prevent a fall at home.  Work with your health care provider to learn what changes you can make to improve your health and wellness and to prevent falls. This information is not intended to replace advice given to you by your health care provider. Make sure you discuss any questions you have with your health care provider. Document Revised: 09/25/2018 Document Reviewed: 04/17/2017 Elsevier Patient Education  2020 Elsevier Inc.  

## 2020-05-19 NOTE — Progress Notes (Signed)
Subjective:    Patient ID: Virginia [name], female    DOB: Jun 30, 1940, 79 y.o.   MRN: [id number]  Chief Complaint  Patient presents with  . Hypertension  . Diabetes   PT presents to the office today for chronic follow up. She states has a Cardiologists for CAD,& A Fib, but has not seen him in a few years. Pt does take Eliquis BID.  Hypertension This is a chronic problem. The current episode started more than 1 year ago. The problem has been resolved since onset. The problem is controlled. Pertinent negatives include no blurred vision, malaise/fatigue, peripheral edema or shortness of breath. Risk factors for coronary artery disease include dyslipidemia, obesity and sedentary lifestyle. The current treatment provides moderate improvement. Hypertensive end-organ damage includes CAD/MI. There is no history of CVA.  Diabetes She presents for her follow-up diabetic visit. She has type 2 diabetes mellitus. Her disease course has been stable. There are no hypoglycemic associated symptoms. Pertinent negatives for diabetes include no blurred vision and no foot paresthesias. Symptoms are stable. Pertinent negatives for diabetic complications include no CVA, heart disease or peripheral neuropathy. Risk factors for coronary artery disease include dyslipidemia, diabetes mellitus, female sex, hypertension and sedentary lifestyle. She is following a generally healthy diet. Her overall blood glucose range is 110-130 mg/dl. An ACE inhibitor/angiotensin II receptor blocker is being taken. Eye exam is current.  Hyperlipidemia This is a chronic problem. The current episode started more than 1 year ago. Exacerbating diseases include obesity. Pertinent negatives include no shortness of breath. Current antihyperlipidemic treatment includes statins. The current treatment provides moderate improvement of lipids. Risk factors for coronary artery disease include dyslipidemia, hypertension, a sedentary lifestyle and  post-menopausal.  Depression        This is a chronic problem.  The current episode started more than 1 year ago.   The onset quality is gradual.   The problem occurs intermittently.  Associated symptoms include irritable and sad.  Associated symptoms include no helplessness and no hopelessness.  Past treatments include SSRIs - Selective serotonin reuptake inhibitors. Arthritis Presents for follow-up visit. She complains of pain and stiffness. The symptoms have been stable. Affected locations include the left knee, right knee, left MCP and right MCP. Her pain is at a severity of 0/10.      Review of Systems  Constitutional: Negative for malaise/fatigue.  Eyes: Negative for blurred vision.  Respiratory: Negative for shortness of breath.   Musculoskeletal: Positive for arthritis and stiffness.  Psychiatric/Behavioral: Positive for depression.  All other systems reviewed and are negative.      Objective:   Physical Exam Vitals reviewed.  Constitutional:      General: She is irritable. She is not in acute distress.    Appearance: She is well-developed.  HENT:     Head: Normocephalic and atraumatic.     Right Ear: Tympanic membrane normal.     Left Ear: Tympanic membrane normal.  Eyes:     Pupils: Pupils are equal, round, and reactive to light.  Neck:     Thyroid: No thyromegaly.  Cardiovascular:     Rate and Rhythm: Normal rate and regular rhythm.     Heart sounds: Normal heart sounds. No murmur heard.   Pulmonary:     Effort: Pulmonary effort is normal. No respiratory distress.     Breath sounds: Normal breath sounds. No wheezing.  Abdominal:     General: Bowel sounds are normal. There is no distension.     Palpations:  Abdomen is soft.     Tenderness: There is no abdominal tenderness.  Musculoskeletal:        General: No tenderness. Normal range of motion.     Cervical back: Normal range of motion and neck supple.  Skin:    General: Skin is warm and dry.  Neurological:       Mental Status: She is alert and oriented to person, place, and time.     Cranial Nerves: No cranial nerve deficit.     Deep Tendon Reflexes: Reflexes are normal and symmetric.  Psychiatric:        Behavior: Behavior normal.        Thought Content: Thought content normal.        Judgment: Judgment normal.       BP 123/71   Pulse 84   Temp 98.4 F (36.9 C) (Temporal)   Ht _0  (1.626 m)   Wt 164 lb 12.8 oz (74.8 kg)   BMI 28.29 kg/m      Assessment & Plan:  LODA BIALAS comes in today with chief complaint of Hypertension and Diabetes   Diagnosis and orders addressed:  1. Type 2 diabetes mellitus with other specified complication, without long-term current use of insulin (HCC) - Bayer DCA Hb A1c Waived - CMP14+EGFR - CBC with Differential/Platelet  2. Need for immunization against influenza - Flu Vaccine QUAD High Dose(Fluad) - CMP14+EGFR - CBC with Differential/Platelet  3. Coronary artery disease due to lipid rich plaque - CMP14+EGFR - CBC with Differential/Platelet  4. Chronic atrial fibrillation (HCC) - CMP14+EGFR - CBC with Differential/Platelet  5. Hypertension associated with diabetes ([place]) - CMP14+EGFR - CBC with Differential/Platelet  6. Gastroesophageal reflux disease, unspecified whether esophagitis present - CMP14+EGFR - CBC with Differential/Platelet  7. Primary osteoarthritis involving multiple joints - CMP14+EGFR - CBC with Differential/Platelet  8. Mixed hyperlipidemia - CMP14+EGFR - CBC with Differential/Platelet  9. Moderate episode of recurrent major depressive disorder (HCC) - CMP14+EGFR - CBC with Differential/Platelet  10. Need for hepatitis C screening test - CMP14+EGFR - CBC with Differential/Platelet - Hepatitis C antibody   Labs pending Health Maintenance reviewed Diet and exercise encouraged  Follow up plan: 6 months    Evelina Dun, FNP

## 2020-05-20 LAB — CBC WITH DIFFERENTIAL/PLATELET
Basophils Absolute: 0 10*3/uL (ref 0.0–0.2)
Basos: 1 %
EOS (ABSOLUTE): 0.2 10*3/uL (ref 0.0–0.4)
Eos: 3 %
Hematocrit: 38.7 % (ref 34.0–46.6)
Hemoglobin: 12.4 g/dL (ref 11.1–15.9)
Immature Grans (Abs): 0 10*3/uL (ref 0.0–0.1)
Immature Granulocytes: 0 %
Lymphocytes Absolute: 2.7 10*3/uL (ref 0.7–3.1)
Lymphs: 35 %
MCH: 27.7 pg (ref 26.6–33.0)
MCHC: 32 g/dL (ref 31.5–35.7)
MCV: 86 fL (ref 79–97)
Monocytes Absolute: 0.7 10*3/uL (ref 0.1–0.9)
Monocytes: 10 %
Neutrophils Absolute: 3.9 10*3/uL (ref 1.4–7.0)
Neutrophils: 51 %
Platelets: 263 10*3/uL (ref 150–450)
RBC: 4.48 x10E6/uL (ref 3.77–5.28)
RDW: 12.1 % (ref 11.7–15.4)
WBC: 7.5 10*3/uL (ref 3.4–10.8)

## 2020-05-20 LAB — CMP14+EGFR
ALT: 13 IU/L (ref 0–32)
AST: 23 IU/L (ref 0–40)
Albumin/Globulin Ratio: 2.2 (ref 1.2–2.2)
Albumin: 4.3 g/dL (ref 3.7–4.7)
Alkaline Phosphatase: 115 IU/L (ref 44–121)
BUN/Creatinine Ratio: 12 (ref 12–28)
BUN: 10 mg/dL (ref 8–27)
Bilirubin Total: 1 mg/dL (ref 0.0–1.2)
CO2: 26 mmol/L (ref 20–29)
Calcium: 9.9 mg/dL (ref 8.7–10.3)
Chloride: 99 mmol/L (ref 96–106)
Creatinine, Ser: 0.81 mg/dL (ref 0.57–1.00)
GFR calc Af Amer: 80 mL/min/{1.73_m2} (ref >59)
GFR calc non Af Amer: 69 mL/min/{1.73_m2} (ref >59)
Globulin, Total: 2 g/dL (ref 1.5–4.5)
Glucose: 110 mg/dL — ABNORMAL HIGH (ref 65–99)
Potassium: 3.8 mmol/L (ref 3.5–5.2)
Sodium: 139 mmol/L (ref 134–144)
Total Protein: 6.3 g/dL (ref 6.0–8.5)

## 2020-05-20 LAB — HEPATITIS C ANTIBODY: Hep C Virus Ab: <0.1 s/co ratio (ref 0.0–0.9)

## 2020-05-27 ENCOUNTER — Encounter: Payer: Self-pay | Admitting: *Deleted

## 2020-05-30 ENCOUNTER — Other Ambulatory Visit: Payer: Self-pay | Admitting: Family

## 2020-07-01 ENCOUNTER — Other Ambulatory Visit: Payer: Self-pay | Admitting: Family

## 2020-08-05 ENCOUNTER — Other Ambulatory Visit: Payer: Self-pay | Admitting: Family

## 2020-08-05 DIAGNOSIS — I480 Paroxysmal atrial fibrillation: Secondary | ICD-10-CM

## 2020-09-02 ENCOUNTER — Other Ambulatory Visit: Payer: Self-pay | Admitting: Family

## 2020-11-08 ENCOUNTER — Other Ambulatory Visit: Payer: Self-pay | Admitting: Family

## 2020-11-09 ENCOUNTER — Other Ambulatory Visit: Payer: Self-pay | Admitting: Family

## 2020-11-16 ENCOUNTER — Ambulatory Visit (INDEPENDENT_AMBULATORY_CARE_PROVIDER_SITE_OTHER): Payer: Medicare Other | Admitting: Family

## 2020-11-16 ENCOUNTER — Other Ambulatory Visit: Payer: Self-pay

## 2020-11-16 ENCOUNTER — Encounter: Payer: Self-pay | Admitting: Family

## 2020-11-16 VITALS — BP 134/56 | HR 86 | Temp 98.0°F | Ht 64.0 in | Wt 164.4 lb

## 2020-11-16 DIAGNOSIS — K219 Gastro-esophageal reflux disease without esophagitis: Secondary | ICD-10-CM | POA: Diagnosis not present

## 2020-11-16 DIAGNOSIS — R413 Other amnesia: Secondary | ICD-10-CM

## 2020-11-16 DIAGNOSIS — E782 Mixed hyperlipidemia: Secondary | ICD-10-CM

## 2020-11-16 DIAGNOSIS — I2510 Atherosclerotic heart disease of native coronary artery without angina pectoris: Secondary | ICD-10-CM | POA: Diagnosis not present

## 2020-11-16 DIAGNOSIS — M8949 Other hypertrophic osteoarthropathy, multiple sites: Secondary | ICD-10-CM | POA: Diagnosis not present

## 2020-11-16 DIAGNOSIS — E1159 Type 2 diabetes mellitus with other circulatory complications: Secondary | ICD-10-CM | POA: Diagnosis not present

## 2020-11-16 DIAGNOSIS — G252 Other specified forms of tremor: Secondary | ICD-10-CM | POA: Diagnosis not present

## 2020-11-16 DIAGNOSIS — E1169 Type 2 diabetes mellitus with other specified complication: Secondary | ICD-10-CM | POA: Diagnosis not present

## 2020-11-16 DIAGNOSIS — I482 Chronic atrial fibrillation, unspecified: Secondary | ICD-10-CM | POA: Diagnosis not present

## 2020-11-16 DIAGNOSIS — I2583 Coronary atherosclerosis due to lipid rich plaque: Secondary | ICD-10-CM | POA: Diagnosis not present

## 2020-11-16 DIAGNOSIS — I152 Hypertension secondary to endocrine disorders: Secondary | ICD-10-CM | POA: Diagnosis not present

## 2020-11-16 DIAGNOSIS — N3000 Acute cystitis without hematuria: Secondary | ICD-10-CM

## 2020-11-16 DIAGNOSIS — F331 Major depressive disorder, recurrent, moderate: Secondary | ICD-10-CM

## 2020-11-16 DIAGNOSIS — M159 Polyosteoarthritis, unspecified: Secondary | ICD-10-CM

## 2020-11-16 LAB — MICROSCOPIC EXAMINATION

## 2020-11-16 LAB — URINALYSIS, COMPLETE
Bilirubin, UA: NEGATIVE
Glucose, UA: NEGATIVE
Nitrite, UA: POSITIVE — AB
Protein,UA: NEGATIVE
Specific Gravity, UA: 1.02 (ref 1.005–1.030)
Urobilinogen, Ur: 1 mg/dL (ref 0.2–1.0)
pH, UA: 7 (ref 5.0–7.5)

## 2020-11-16 LAB — BAYER DCA HB A1C WAIVED: HB A1C (BAYER DCA - WAIVED): 7 % — ABNORMAL HIGH (ref <7.0)

## 2020-11-16 MED ORDER — CEPHALEXIN 500 MG PO CAPS: 500.0000 mg | ORAL_CAPSULE | Freq: Two times a day (BID) | ORAL | 0 refills | Status: DC

## 2020-11-16 NOTE — Progress Notes (Addendum)
Subjective:    Patient ID: Virginia [name], female    DOB: 06/20/1940, 80 y.o.   MRN: [id number]  Chief Complaint  Patient presents with  . Diabetes  . Medical Management of Chronic Issues  . Memory Loss    PT presents to the office today for chronic follow up. She states has a Cardiologists for CAD,& A Fib, but has not seen him in a few years. Pt does take Eliquis BID.  Pt is complaining of tremors that started a few years ago, but worsen over the last 6 months. Reports these tremors happen at rest and while doing things. Her cousin is with her and states she can also report changes in her memory.  Diabetes She presents for her follow-up diabetic visit. She has type 2 diabetes mellitus. Her disease course has been stable. There are no hypoglycemic associated symptoms. Associated symptoms include fatigue. Pertinent negatives for diabetes include no blurred vision and no foot paresthesias. Symptoms are stable. Diabetic complications include heart disease. Pertinent negatives for diabetic complications include no nephropathy or peripheral neuropathy. Risk factors for coronary artery disease include dyslipidemia, diabetes mellitus, hypertension and sedentary lifestyle. (Does not check BS at home ) An ACE inhibitor/angiotensin II receptor blocker is being taken.  Hyperlipidemia This is a chronic problem. The current episode started more than 1 year ago. The problem is controlled. Current antihyperlipidemic treatment includes statins. Risk factors for coronary artery disease include dyslipidemia, diabetes mellitus, hypertension and a sedentary lifestyle.  Arthritis Presents for follow-up visit. She complains of pain and stiffness. The symptoms have been stable. Affected locations include the left knee, right knee, left MCP and right MCP. Her pain is at a severity of 5/10. Associated symptoms include fatigue.  Depression        This is a chronic problem.  The current episode started more than 1  year ago.   The onset quality is gradual.   The problem occurs intermittently.  Associated symptoms include fatigue, hopelessness, irritable and restlessness.  Past treatments include SSRIs - Selective serotonin reuptake inhibitors. Gastroesophageal Reflux She complains of belching and heartburn. This is a chronic problem. The current episode started more than 1 year ago. The problem occurs occasionally. Associated symptoms include fatigue. She has tried an antacid for the symptoms. The treatment provided moderate relief.      Review of Systems  Constitutional: Positive for fatigue.  Eyes: Negative for blurred vision.  Gastrointestinal: Positive for heartburn.  Musculoskeletal: Positive for arthritis and stiffness.  Psychiatric/Behavioral: Positive for depression.  All other systems reviewed and are negative.      Objective:   Physical Exam Vitals reviewed.  Constitutional:      General: She is irritable. She is not in acute distress.    Appearance: She is well-developed.  HENT:     Head: Normocephalic and atraumatic.     Right Ear: Tympanic membrane normal.     Left Ear: Tympanic membrane normal.  Eyes:     Pupils: Pupils are equal, round, and reactive to light.  Neck:     Thyroid: No thyromegaly.  Cardiovascular:     Rate and Rhythm: Normal rate and regular rhythm.     Heart sounds: Normal heart sounds. No murmur heard.   Pulmonary:     Effort: Pulmonary effort is normal. No respiratory distress.     Breath sounds: Normal breath sounds. No wheezing.  Abdominal:     General: Bowel sounds are normal. There is no distension.  Palpations: Abdomen is soft.     Tenderness: There is no abdominal tenderness.  Musculoskeletal:        General: No tenderness. Normal range of motion.     Cervical back: Normal range of motion and neck supple.     Comments: Resting tremor present  Skin:    General: Skin is warm and dry.  Neurological:     Mental Status: She is alert and  oriented to person, place, and time.     Cranial Nerves: No cranial nerve deficit.     Deep Tendon Reflexes: Reflexes are normal and symmetric.  Psychiatric:        Behavior: Behavior normal.        Thought Content: Thought content normal.        Judgment: Judgment normal.       BP (!) 134/56   Pulse 86   Temp 98 F (36.7 C) (Temporal)   Ht '5\' 4"'  (1.626 m)   Wt 164 lb 6.4 oz (74.6 kg)   BMI 28.22 kg/m      Assessment & Plan:  [name] comes in today with chief complaint of Diabetes, Medical Management of Chronic Issues, and Memory Loss   Diagnosis and orders addressed:  1. Hypertension associated with diabetes ([place]) - CMP14+EGFR - CBC with Differential/Platelet  2. Chronic atrial fibrillation (HCC) - CMP14+EGFR - CBC with Differential/Platelet  3. Coronary artery disease due to lipid rich plaqu - CMP14+EGFR - CBC with Differential/Platelet  4. Gastroesophageal reflux disease, unspecified whether esophagitis present - CMP14+EGFR - CBC with Differential/Platelet  5. Type 2 diabetes mellitus with other specified complication, without long-term current use of insulin (HCC) - Bayer DCA Hb A1c Waived - CMP14+EGFR - CBC with Differential/Platelet - Microalbumin / creatinine urine ratio  6. Primary osteoarthritis involving multiple joints - CMP14+EGFR - CBC with Differential/Platelet  7. Moderate episode of recurrent major depressive disorder (HCC) - CMP14+EGFR - CBC with Differential/Platelet  8. Mixed hyperlipidemia - CMP14+EGFR - CBC with Differential/Platelet  9. Resting tremor  - Ambulatory referral to Neurology - CMP14+EGFR - CBC with Differential/Platelet - TSH  10. Memory changes - Ambulatory referral to Neurology - CMP14+EGFR - CBC with Differential/Platelet   11. Acute cystitis without hematuria Force fluids AZO over the counter X2 days RTO prn Culture pending - Urinalysis, Complete - Urine Culture  Labs pending Health  Maintenance reviewed Diet and exercise encouraged  Follow up plan: 3 months   Evelina Dun, FNP

## 2020-11-16 NOTE — Addendum Note (Signed)
Addended by: Jannifer Rodney A on: 11/16/2020 11:12 AM   Modules accepted: Orders

## 2020-11-16 NOTE — Patient Instructions (Signed)

## 2020-11-17 LAB — CMP14+EGFR
ALT: 9 IU/L (ref 0–32)
AST: 22 IU/L (ref 0–40)
Albumin/Globulin Ratio: 2.8 — ABNORMAL HIGH (ref 1.2–2.2)
Albumin: 4.5 g/dL (ref 3.7–4.7)
Alkaline Phosphatase: 104 IU/L (ref 44–121)
BUN/Creatinine Ratio: 14 (ref 12–28)
BUN: 12 mg/dL (ref 8–27)
Bilirubin Total: 1.2 mg/dL (ref 0.0–1.2)
CO2: 27 mmol/L (ref 20–29)
Calcium: 10.1 mg/dL (ref 8.7–10.3)
Chloride: 98 mmol/L (ref 96–106)
Creatinine, Ser: 0.85 mg/dL (ref 0.57–1.00)
Globulin, Total: 1.6 g/dL (ref 1.5–4.5)
Glucose: 119 mg/dL — ABNORMAL HIGH (ref 65–99)
Potassium: 3.9 mmol/L (ref 3.5–5.2)
Sodium: 140 mmol/L (ref 134–144)
Total Protein: 6.1 g/dL (ref 6.0–8.5)
eGFR: 70 mL/min/{1.73_m2} (ref >59)

## 2020-11-17 LAB — MICROALBUMIN / CREATININE URINE RATIO
Creatinine, Urine: 146.5 mg/dL
Microalb/Creat Ratio: 4 mg/g creat (ref 0–29)
Microalbumin, Urine: 6.2 ug/mL

## 2020-11-17 LAB — CBC WITH DIFFERENTIAL/PLATELET
Basophils Absolute: 0.1 10*3/uL (ref 0.0–0.2)
Basos: 1 %
EOS (ABSOLUTE): 0.2 10*3/uL (ref 0.0–0.4)
Eos: 2 %
Hematocrit: 39.4 % (ref 34.0–46.6)
Hemoglobin: 12.7 g/dL (ref 11.1–15.9)
Immature Grans (Abs): 0 10*3/uL (ref 0.0–0.1)
Immature Granulocytes: 0 %
Lymphocytes Absolute: 2.8 10*3/uL (ref 0.7–3.1)
Lymphs: 33 %
MCH: 28.4 pg (ref 26.6–33.0)
MCHC: 32.2 g/dL (ref 31.5–35.7)
MCV: 88 fL (ref 79–97)
Monocytes Absolute: 0.7 10*3/uL (ref 0.1–0.9)
Monocytes: 8 %
Neutrophils Absolute: 4.9 10*3/uL (ref 1.4–7.0)
Neutrophils: 56 %
Platelets: 240 10*3/uL (ref 150–450)
RBC: 4.47 x10E6/uL (ref 3.77–5.28)
RDW: 13.1 % (ref 11.7–15.4)
WBC: 8.6 10*3/uL (ref 3.4–10.8)

## 2020-11-17 LAB — TSH: TSH: 2.28 u[IU]/mL (ref 0.450–4.500)

## 2020-11-19 LAB — URINE CULTURE

## 2020-11-28 ENCOUNTER — Ambulatory Visit (INDEPENDENT_AMBULATORY_CARE_PROVIDER_SITE_OTHER): Payer: Medicare Other | Admitting: Family

## 2020-11-28 ENCOUNTER — Other Ambulatory Visit: Payer: Self-pay

## 2020-11-28 ENCOUNTER — Encounter: Payer: Self-pay | Admitting: Family

## 2020-11-28 VITALS — BP 119/60 | HR 46 | Temp 97.0°F | Ht 64.0 in | Wt 165.0 lb

## 2020-11-28 DIAGNOSIS — H6123 Impacted cerumen, bilateral: Secondary | ICD-10-CM

## 2020-11-28 DIAGNOSIS — H938X1 Other specified disorders of right ear: Secondary | ICD-10-CM

## 2020-11-28 NOTE — Patient Instructions (Signed)
Earwax Buildup, Adult ?The ears produce a substance called earwax that helps keep bacteria out of the ear and protects the skin in the ear canal. Occasionally, earwax can build up in the ear and cause discomfort or hearing loss. ?What are the causes? ?This condition is caused by a buildup of earwax. Ear canals are self-cleaning. Ear wax is made in the outer part of the ear canal and generally falls out in small amounts over time. ?When the self-cleaning mechanism is not working, earwax builds up and can cause decreased hearing and discomfort. Attempting to clean ears with cotton swabs can push the earwax deep into the ear canal and cause decreased hearing and pain. ?What increases the risk? ?This condition is more likely to develop in people who: ?Clean their ears often with cotton swabs. ?Pick at their ears. ?Use earplugs or in-ear headphones often, or wear hearing aids. ?The following factors may also make you more likely to develop this condition: ?Being female. ?Being of older age. ?Naturally producing more earwax. ?Having narrow ear canals. ?Having earwax that is overly thick or sticky. ?Having excess hair in the ear canal. ?Having eczema. ?Being dehydrated. ?What are the signs or symptoms? ?Symptoms of this condition include: ?Reduced or muffled hearing. ?A feeling of fullness in the ear or feeling that the ear is plugged. ?Fluid coming from the ear. ?Ear pain or an itchy ear. ?Ringing in the ear. ?Coughing. ?Balance problems. ?An obvious piece of earwax that can be seen inside the ear canal. ?How is this diagnosed? ?This condition may be diagnosed based on: ?Your symptoms. ?Your medical history. ?An ear exam. During the exam, your health care provider will look into your ear with an instrument called an otoscope. ?You may have tests, including a hearing test. ?How is this treated? ?This condition may be treated by: ?Using ear drops to soften the earwax. ?Having the earwax removed by a health care provider. The  health care provider may: ?Flush the ear with water. ?Use an instrument that has a loop on the end (curette). ?Use a suction device. ?Having surgery to remove the wax buildup. This may be done in severe cases. ?Follow these instructions at home: ? ?Take over-the-counter and prescription medicines only as told by your health care provider. ?Do not put any objects, including cotton swabs, into your ear. You can clean the opening of your ear canal with a washcloth or facial tissue. ?Follow instructions from your health care provider about cleaning your ears. Do not overclean your ears. ?Drink enough fluid to keep your urine pale yellow. This will help to thin the earwax. ?Keep all follow-up visits as told. If earwax builds up in your ears often or if you use hearing aids, consider seeing your health care provider for routine, preventive ear cleanings. Ask your health care provider how often you should schedule your cleanings. ?If you have hearing aids, clean them according to instructions from the manufacturer and your health care provider. ?Contact a health care provider if: ?You have ear pain. ?You develop a fever. ?You have pus or other fluid coming from your ear. ?You have hearing loss. ?You have ringing in your ears that does not go away. ?You feel like the room is spinning (vertigo). ?Your symptoms do not improve with treatment. ?Get help right away if: ?You have bleeding from the affected ear. ?You have severe ear pain. ?Summary ?Earwax can build up in the ear and cause discomfort or hearing loss. ?The most common symptoms of this condition include   reduced or muffled hearing, a feeling of fullness in the ear, or feeling that the ear is plugged. ?This condition may be diagnosed based on your symptoms, your medical history, and an ear exam. ?This condition may be treated by using ear drops to soften the earwax or by having the earwax removed by a health care provider. ?Do not put any objects, including cotton  swabs, into your ear. You can clean the opening of your ear canal with a washcloth or facial tissue. ?This information is not intended to replace advice given to you by your health care provider. Make sure you discuss any questions you have with your health care provider. ?Document Revised: 09/22/2019 Document Reviewed: 09/22/2019 ?Elsevier Patient Education ? 2022 [employer] ? ?

## 2020-11-28 NOTE — Progress Notes (Signed)
   Subjective:    Patient ID: Virginia [name], female    DOB: 06-06-1941, 80 y.o.   MRN: [id number]  Chief Complaint  Patient presents with   Hypertension   Ear Fullness    Right ear    Pt presents to the office today with right ear fullness. She has been using OTC ear wax drops with mild relief, but continues to have a fullness and decrease hearing. Denies any pain or fever.  Hypertension This is a new problem. The current episode started 1 to 4 weeks ago. The problem has been rapidly improving since onset. Agents associated with hypertension include amphetamines.  Ear Fullness      Review of Systems  All other systems reviewed and are negative.     Objective:   Physical Exam Vitals reviewed.  Constitutional:      General: She is not in acute distress.    Appearance: She is well-developed.  HENT:     Head: Normocephalic and atraumatic.     Right Ear: There is impacted cerumen.     Left Ear: There is impacted cerumen.  Eyes:     Pupils: Pupils are equal, round, and reactive to light.  Neck:     Thyroid: No thyromegaly.  Cardiovascular:     Rate and Rhythm: Normal rate and regular rhythm.     Heart sounds: Normal heart sounds. No murmur heard. Pulmonary:     Effort: Pulmonary effort is normal. No respiratory distress.     Breath sounds: Normal breath sounds. No wheezing.  Abdominal:     General: Bowel sounds are normal. There is no distension.     Palpations: Abdomen is soft.     Tenderness: There is no abdominal tenderness.  Musculoskeletal:        General: No tenderness. Normal range of motion.     Cervical back: Normal range of motion and neck supple.  Skin:    General: Skin is warm and dry.  Neurological:     Mental Status: She is alert and oriented to person, place, and time.     Cranial Nerves: No cranial nerve deficit.     Deep Tendon Reflexes: Reflexes are normal and symmetric.  Psychiatric:        Behavior: Behavior normal.        Thought Content:  Thought content normal.        Judgment: Judgment normal.    Bilateral ears washed with warm water and peroxide, pt tolerated well. TM WNL   BP 119/60   Pulse (!) 46   Temp (!) 97 F (36.1 C) (Temporal)   Ht 5\' 4"  (1.626 m)   Wt 165 lb (74.8 kg)   BMI 28.32 kg/m      Assessment & Plan:  1. Sensation of fullness in right ear  2. Bilateral impacted cerumen  Resolved now Can use OTC drops as needed Avoid sticking anything into ear  , FNP

## 2020-11-30 ENCOUNTER — Other Ambulatory Visit: Payer: Self-pay | Admitting: Family

## 2020-11-30 DIAGNOSIS — I2510 Atherosclerotic heart disease of native coronary artery without angina pectoris: Secondary | ICD-10-CM

## 2020-11-30 DIAGNOSIS — I482 Chronic atrial fibrillation, unspecified: Secondary | ICD-10-CM

## 2020-12-26 ENCOUNTER — Other Ambulatory Visit: Payer: Self-pay | Admitting: Family

## 2021-01-24 ENCOUNTER — Other Ambulatory Visit: Payer: Self-pay | Admitting: Family

## 2021-01-24 DIAGNOSIS — I480 Paroxysmal atrial fibrillation: Secondary | ICD-10-CM

## 2021-02-16 ENCOUNTER — Other Ambulatory Visit: Payer: Self-pay

## 2021-02-16 ENCOUNTER — Ambulatory Visit (INDEPENDENT_AMBULATORY_CARE_PROVIDER_SITE_OTHER): Payer: Medicare Other | Admitting: Family

## 2021-02-16 ENCOUNTER — Encounter: Payer: Self-pay | Admitting: Family

## 2021-02-16 VITALS — BP 122/77 | HR 57 | Temp 97.3°F | Ht 64.0 in | Wt 158.0 lb

## 2021-02-16 DIAGNOSIS — M159 Polyosteoarthritis, unspecified: Secondary | ICD-10-CM

## 2021-02-16 DIAGNOSIS — K219 Gastro-esophageal reflux disease without esophagitis: Secondary | ICD-10-CM | POA: Diagnosis not present

## 2021-02-16 DIAGNOSIS — I152 Hypertension secondary to endocrine disorders: Secondary | ICD-10-CM

## 2021-02-16 DIAGNOSIS — I480 Paroxysmal atrial fibrillation: Secondary | ICD-10-CM

## 2021-02-16 DIAGNOSIS — I2583 Coronary atherosclerosis due to lipid rich plaque: Secondary | ICD-10-CM

## 2021-02-16 DIAGNOSIS — E1169 Type 2 diabetes mellitus with other specified complication: Secondary | ICD-10-CM | POA: Diagnosis not present

## 2021-02-16 DIAGNOSIS — I2510 Atherosclerotic heart disease of native coronary artery without angina pectoris: Secondary | ICD-10-CM

## 2021-02-16 DIAGNOSIS — F331 Major depressive disorder, recurrent, moderate: Secondary | ICD-10-CM

## 2021-02-16 DIAGNOSIS — I482 Chronic atrial fibrillation, unspecified: Secondary | ICD-10-CM

## 2021-02-16 DIAGNOSIS — E782 Mixed hyperlipidemia: Secondary | ICD-10-CM

## 2021-02-16 DIAGNOSIS — E1159 Type 2 diabetes mellitus with other circulatory complications: Secondary | ICD-10-CM | POA: Diagnosis not present

## 2021-02-16 DIAGNOSIS — M150 Primary generalized (osteo)arthritis: Secondary | ICD-10-CM

## 2021-02-16 DIAGNOSIS — M8949 Other hypertrophic osteoarthropathy, multiple sites: Secondary | ICD-10-CM | POA: Diagnosis not present

## 2021-02-16 MED ORDER — LOSARTAN POTASSIUM-HCTZ 100-25 MG PO TABS: 1.0000 | ORAL_TABLET | Freq: Every day | ORAL | 0 refills | Status: DC

## 2021-02-16 MED ORDER — APIXABAN 5 MG PO TABS
5.0000 mg | ORAL_TABLET | Freq: Two times a day (BID) | ORAL | 0 refills | Status: DC
Start: 2021-02-16 — End: 2021-09-01

## 2021-02-16 MED ORDER — ATORVASTATIN CALCIUM 80 MG PO TABS: 80.0000 mg | ORAL_TABLET | Freq: Every day | ORAL | 3 refills | Status: DC

## 2021-02-16 MED ORDER — PAROXETINE HCL 10 MG PO TABS: 10.0000 mg | ORAL_TABLET | Freq: Every morning | ORAL | 1 refills | Status: DC

## 2021-02-16 MED ORDER — AMLODIPINE BESYLATE 5 MG PO TABS: ORAL_TABLET | ORAL | 3 refills | Status: DC

## 2021-02-16 MED ORDER — HYDROCHLOROTHIAZIDE 25 MG PO TABS: ORAL_TABLET | ORAL | 1 refills | Status: DC

## 2021-02-16 MED ORDER — METOPROLOL SUCCINATE ER 50 MG PO TB24: 75.0000 mg | ORAL_TABLET | Freq: Every day | ORAL | 1 refills | Status: DC

## 2021-02-16 NOTE — Patient Instructions (Signed)
Health Maintenance After Age 80 After age 80, you are at a higher risk for certain long-term diseases and infections as well as injuries from falls. Falls are a major cause of broken bones and head injuries in people who are older than age 80. Getting regular preventive care can help to keep you healthy and well. Preventive care includes getting regular testing and making lifestyle changes as recommended by your health care provider. Talk with your health care provider about: Which screenings and tests you should have. A screening is a test that checks for a disease when you have no symptoms. A diet and exercise plan that is right for you. What should I know about screenings and tests to prevent falls? Screening and testing are the best ways to find a health problem early. Early diagnosis and treatment give you the best chance of managing medical conditions that are common after age 80. Certain conditions and lifestyle choices may make you more likely to have a fall. Your health care provider may recommend: Regular vision checks. Poor vision and conditions such as cataracts can make you more likely to have a fall. If you wear glasses, make sure to get your prescription updated if your vision changes. Medicine review. Work with your health care provider to regularly review all of the medicines you are taking, including over-the-counter medicines. Ask your health care provider about any side effects that may make you more likely to have a fall. Tell your health care provider if any medicines that you take make you feel dizzy or sleepy. Osteoporosis screening. Osteoporosis is a condition that causes the bones to get weaker. This can make the bones weak and cause them to break more easily. Blood pressure screening. Blood pressure changes and medicines to control blood pressure can make you feel dizzy. Strength and balance checks. Your health care provider may recommend certain tests to check your strength and  balance while standing, walking, or changing positions. Foot health exam. Foot pain and numbness, as well as not wearing proper footwear, can make you more likely to have a fall. Depression screening. You may be more likely to have a fall if you have a fear of falling, feel emotionally low, or feel unable to do activities that you used to do. Alcohol use screening. Using too much alcohol can affect your balance and may make you more likely to have a fall. What actions can I take to lower my risk of falls? General instructions Talk with your health care provider about your risks for falling. Tell your health care provider if: You fall. Be sure to tell your health care provider about all falls, even ones that seem minor. You feel dizzy, sleepy, or off-balance. Take over-the-counter and prescription medicines only as told by your health care provider. These include any supplements. Eat a healthy diet and maintain a healthy weight. A healthy diet includes low-fat dairy products, low-fat (lean) meats, and fiber from whole grains, beans, and lots of fruits and vegetables. Home safety Remove any tripping hazards, such as rugs, cords, and clutter. Install safety equipment such as grab bars in bathrooms and safety rails on stairs. Keep rooms and walkways well-lit. Activity  Follow a regular exercise program to stay fit. This will help you maintain your balance. Ask your health care provider what types of exercise are appropriate for you. If you need a cane or walker, use it as recommended by your health care provider. Wear supportive shoes that have nonskid soles. Lifestyle Do not   drink alcohol if your health care provider tells you not to drink. If you drink alcohol, limit how much you have: 0-1 drink a day for women. 0-2 drinks a day for men. Be aware of how much alcohol is in your drink. In the [place] one drink equals one typical bottle of beer (12 oz), one-half glass of wine (5 oz), or one shot of  hard liquor (1 oz). Do not use any products that contain nicotine or tobacco, such as cigarettes and e-cigarettes. If you need help quitting, ask your health care provider. Summary Having a healthy lifestyle and getting preventive care can help to protect your health and wellness after age 80. Screening and testing are the best way to find a health problem early and help you avoid having a fall. Early diagnosis and treatment give you the best chance for managing medical conditions that are more common for people who are older than age 80. Falls are a major cause of broken bones and head injuries in people who are older than age 80. Take precautions to prevent a fall at home. Work with your health care provider to learn what changes you can make to improve your health and wellness and to prevent falls. This information is not intended to replace advice given to you by your health care provider. Make sure you discuss any questions you have with your health care provider. Document Revised: 08/12/2020 Document Reviewed: 05/20/2020 Elsevier Patient Education  2022 [employer] Inc.  

## 2021-02-16 NOTE — Progress Notes (Signed)
Subjective:    Patient ID: Virginia [name], female    DOB: [birth year], 80 y.o.   MRN: [id number]  Chief Complaint  Patient presents with   Medical Management of Chronic Issues   PT presents to the office today for chronic follow up. She states has a Cardiologists for CAD,& A Fib, but has not seen him in a few years. Pt does take Eliquis BID.    Pt is complaining of tremors that started a few years ago, but worsen over the last 6 months. Reports these tremors happen at rest and while doing things. She has a follow up with Neurologists on 03/06/21 that she has requested.   Diabetes She presents for her follow-up diabetic visit. She has type 2 diabetes mellitus. There are no hypoglycemic associated symptoms. Pertinent negatives for diabetes include no blurred vision and no foot paresthesias. Symptoms are stable. Pertinent negatives for diabetic complications include no CVA, heart disease, nephropathy or peripheral neuropathy. Risk factors for coronary artery disease include dyslipidemia, diabetes mellitus, hypertension, sedentary lifestyle and post-menopausal. She is following a generally unhealthy diet. Her overall blood glucose range is 110-130 mg/dl. An ACE inhibitor/angiotensin II receptor blocker is being taken.  Hypertension This is a chronic problem. The current episode started more than 1 year ago. The problem has been resolved since onset. The problem is controlled. Pertinent negatives include no blurred vision, malaise/fatigue, peripheral edema or shortness of breath. Risk factors for coronary artery disease include dyslipidemia and diabetes mellitus. The current treatment provides moderate improvement. There is no history of CVA.  Gastroesophageal Reflux She complains of belching and heartburn. This is a chronic problem. The current episode started more than 1 year ago. The problem occurs occasionally. She has tried a PPI for the symptoms. The treatment provided moderate relief.   Arthritis Presents for follow-up visit. She complains of pain and stiffness. The symptoms have been stable. Her pain is at a severity of 0/10.  Depression        This is a chronic problem.  The current episode started more than 1 year ago.   The onset quality is gradual.   The problem occurs intermittently.  Associated symptoms include no helplessness, no hopelessness, not irritable, no decreased interest, not sad and no suicidal ideas.  Past treatments include SSRIs - Selective serotonin reuptake inhibitors. Hyperlipidemia This is a chronic problem. The current episode started more than 1 year ago. The problem is controlled. Exacerbating diseases include obesity. Pertinent negatives include no shortness of breath. Current antihyperlipidemic treatment includes statins. The current treatment provides moderate improvement of lipids. Risk factors for coronary artery disease include dyslipidemia, hypertension, a sedentary lifestyle and post-menopausal.     Review of Systems  Constitutional:  Negative for malaise/fatigue.  Eyes:  Negative for blurred vision.  Respiratory:  Negative for shortness of breath.   Gastrointestinal:  Positive for heartburn.  Musculoskeletal:  Positive for arthritis and stiffness.  Psychiatric/Behavioral:  Positive for depression. Negative for suicidal ideas.   All other systems reviewed and are negative.     Objective:   Physical Exam Vitals reviewed.  Constitutional:      General: She is not irritable.She is not in acute distress.    Appearance: She is well-developed. She is obese.  HENT:     Head: Normocephalic and atraumatic.     Right Ear: Tympanic membrane normal.     Left Ear: Tympanic membrane normal.  Eyes:     Pupils: Pupils are equal, round, and reactive to light.  Neck:     Thyroid: No thyromegaly.  Cardiovascular:     Rate and Rhythm: Normal rate and regular rhythm.     Heart sounds: Normal heart sounds. No murmur heard. Pulmonary:     Effort:  Pulmonary effort is normal. No respiratory distress.     Breath sounds: Normal breath sounds. No wheezing.  Abdominal:     General: Bowel sounds are normal. There is no distension.     Palpations: Abdomen is soft.     Tenderness: There is no abdominal tenderness.  Musculoskeletal:        General: No tenderness. Normal range of motion.     Cervical back: Normal range of motion and neck supple.  Skin:    General: Skin is warm and dry.  Neurological:     Mental Status: She is alert and oriented to person, place, and time.     Cranial Nerves: No cranial nerve deficit.     Deep Tendon Reflexes: Reflexes are normal and symmetric.  Psychiatric:        Behavior: Behavior normal.        Thought Content: Thought content normal.        Judgment: Judgment normal.       BP 122/77   Pulse (!) 57   Temp (!) 97.3 F (36.3 C) (Temporal)   Ht 5\' 4"  (1.626 m)   Wt 158 lb (71.7 kg)   SpO2 97%   BMI 27.12 kg/m   Assessment & Plan:  Virginia [name] comes in today with chief complaint of Medical Management of Chronic Issues   Diagnosis and orders addressed:  1. Chronic atrial fibrillation (HCC) - metoprolol succinate (TOPROL-XL) 50 MG 24 hr tablet; Take 1.5 tablets (75 mg total) by mouth daily. Take with or immediately following a meal.  Dispense: 90 tablet; Refill: 1  2. Coronary artery disease due to lipid rich plaque - metoprolol succinate (TOPROL-XL) 50 MG 24 hr tablet; Take 1.5 tablets (75 mg total) by mouth daily. Take with or immediately following a meal.  Dispense: 90 tablet; Refill: 1  3. Paroxysmal atrial fibrillation (HCC) - apixaban (ELIQUIS) 5 MG TABS tablet; Take 1 tablet (5 mg total) by mouth 2 (two) times daily.  Dispense: 180 tablet; Refill: 0  4. Hypertension associated with diabetes (HCC)  5. Gastroesophageal reflux disease, unspecified whether esophagitis present  6. Type 2 diabetes mellitus with other specified complication, without long-term current use of insulin  (HCC)  7. Primary osteoarthritis involving multiple joints  8. Moderate episode of recurrent major depressive disorder (HCC)  9. Mixed hyperlipidemia   Stop mobic and aspirin since she is taking Eliquis Labs reviewed  Health Maintenance reviewed Diet and exercise encouraged  Follow up plan: 3 months    Criss Rosales, FNP

## 2021-03-06 ENCOUNTER — Encounter: Payer: Self-pay | Admitting: Diagnostic Neuroimaging

## 2021-03-06 ENCOUNTER — Telehealth: Payer: Self-pay | Admitting: Diagnostic Neuroimaging

## 2021-03-06 ENCOUNTER — Ambulatory Visit (INDEPENDENT_AMBULATORY_CARE_PROVIDER_SITE_OTHER): Payer: Medicare Other | Admitting: Diagnostic Neuroimaging

## 2021-03-06 VITALS — BP 114/64 | HR 79 | Ht 65.0 in | Wt 155.0 lb

## 2021-03-06 DIAGNOSIS — R413 Other amnesia: Secondary | ICD-10-CM

## 2021-03-06 NOTE — Progress Notes (Signed)
[facility]  PATIENT: Virginia [name] DOB: Oct 05, 1940  REFERRING CLINICIAN: Junie Spencer, FNP HISTORY FROM: patient, sister, cousin REASON FOR VISIT: new consult    HISTORICAL  CHIEF COMPLAINT:  Chief Complaint  Patient presents with   Tremors    RM 7 with cousin lola and sister cathy Pt is well, she has had tremors for about 2 yrs but has gotten worse in the last yr.  PT doesn't think Memory has changed but family has noticed a changed such as people names, what she has ate and  reiteration     HISTORY OF PRESENT ILLNESS:   80 year old female here for evaluation of tremor and memory loss.  Patient has had onset of tremors and memory loss starting in 2021.  Symptoms worsening over time.  She is short-term embolus, forgetting recent events, conversations, names.  She is also had progressive resting tremor in right upper extremity.  Has some mild head tremor.  Also has significant gait and balance difficulties, mainly related to her left knee arthritis and valgus deformity.  Family history of Parkinson's disease and several uncles on maternal and paternal side.  Patient lives at her own home, and is primary caregiver for 68 year old daughter with Down syndrome.  She has other family members who live nearby.  She has atrial fibrillation is on Eliquis.  She stays active at home and in her garden.  She drives and uses riding lawn more.  She is fallen off of her lawnmower recently.  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  Family is concerned about her appetite and nutrition.   REVIEW OF SYSTEMS: Full 14 system review of systems performed and negative with exception of: as per HPI.  ALLERGIES: No Known Allergies  HOME MEDICATIONS: Outpatient Medications Prior to Visit  Medication Sig Dispense Refill   amLODipine (NORVASC) 5 MG tablet TAKE ONE (1) TABLET EACH DAY 90 tablet 3   apixaban (ELIQUIS) 5 MG TABS tablet Take 1  tablet (5 mg total) by mouth 2 (two) times daily. 180 tablet 0   Ascorbic Acid (VITAMIN C) 1000 MG tablet Take 1,000 mg by mouth daily.     atorvastatin (LIPITOR) 80 MG tablet Take 1 tablet (80 mg total) by mouth daily. 90 tablet 3   hydrochlorothiazide (HYDRODIURIL) 25 MG tablet TAKE ONE (1) TABLET EACH DAY 90 tablet 1   losartan-hydrochlorothiazide (HYZAAR) 100-25 MG tablet Take 1 tablet by mouth daily. 90 tablet 0   metoprolol succinate (TOPROL-XL) 50 MG 24 hr tablet Take 1.5 tablets (75 mg total) by mouth daily. Take with or immediately following a meal. 90 tablet 1   PARoxetine (PAXIL) 10 MG tablet Take 1 tablet (10 mg total) by mouth every morning. 90 tablet 1   aspirin EC 81 MG tablet Take 1 tablet (81 mg total) by mouth daily. 90 tablet 3   No facility-administered medications prior to visit.    PAST MEDICAL HISTORY: Past Medical History:  Diagnosis Date   Coronary artery disease    status post coronary artery bypass grafting x3 in 1999   Hyperlipidemia    Hypertension    Renal artery stenosis (HCC)    S/P CABG (coronary artery bypass graft) 1999   x3   Type 2 diabetes mellitus (HCC)     PAST SURGICAL HISTORY: Past Surgical History:  Procedure Laterality Date   CARDIAC CATHETERIZATION  04/17/2007   L main 50-60% ostital stenosis, patent LAD, 90% stenosis at  L Cfx, dominant RCA with 60-70% segmental stenosis, patent LIMA-LAD, patent free LIMA-OM, patent VG-PDA (Dr. Erlene Quan)   CARDIAC CATHETERIZATION  03/02/2003   L main with 50% osital stenosis, LAD totally occluded after 2nd diagonal, L Cfx with 90% ostial OM, RCA with 70-80% segmental stenosis, patent LIMA-LAD, patent free RIMA-Cfx marginal, patent VG to PDA (Dr. Erlene Quan)   CARDIAC CATHETERIZATION  05/18/1998   significant ostial Cfx & OM & mid LAD disease - subsequent CABG (Dr. Erlene Quan)   CAROTID DOPPLER  03/2006   right bulb with 0-49% diameter reduction   CORONARY ARTERY BYPASS GRAFT  05/19/1998   LIMA to LAD, free  RIMA to OM, VG to distal RCA   NM MYOCAR PERF WALL MOTION  04/2011   lexiscan - fixed paical and basal-mid lateral defect, no reversible ischemia; EF 58%; PVCs noted; low risk   RENAL DOPPLER  09/2012   right renal artery 60-99% diameter reduction, left renal artery 1-59% diameter reduction   TOTAL KNEE ARTHROPLASTY Right    Dr. Hilda Lias    FAMILY HISTORY: Family History  Problem Relation Age of Onset   Stroke Mother    Heart attack Son     SOCIAL HISTORY: Social History   Socioeconomic History   Marital status: Widowed    Spouse name: Not on file   Number of children: 3   Years of education: 12   Highest education level: High school graduate  Occupational History   Occupation: retired  Tobacco Use   Smoking status: Never   Smokeless tobacco: Never  Vaping Use   Vaping Use: Never used  Substance and Sexual Activity   Alcohol use: No   Drug use: No   Sexual activity: Not Currently    Birth control/protection: Post-menopausal  Other Topics Concern   Not on file  Social History Narrative   Not on file   Social Determinants of Health   Financial Resource Strain: Not on file  Food Insecurity: Not on file  Transportation Needs: Not on file  Physical Activity: Not on file  Stress: Not on file  Social Connections: Not on file  Intimate Partner Violence: Not on file     PHYSICAL EXAM  GENERAL EXAM/CONSTITUTIONAL: Vitals:  Vitals:   03/06/21 1123  BP: 114/64  Pulse: 79  Weight: 155 lb (70.3 kg)  Height: 5\' 5"  (1.651 m)   Body mass index is 25.79 kg/m. Wt Readings from Last 3 Encounters:  03/06/21 155 lb (70.3 kg)  02/16/21 158 lb (71.7 kg)  11/28/20 165 lb (74.8 kg)   Patient is in no distress; well developed, nourished and groomed; neck is supple  CARDIOVASCULAR: Examination of carotid arteries is normal; no carotid bruits Regular rate and rhythm, no murmurs Examination of peripheral vascular system by observation and palpation is  normal  EYES: Ophthalmoscopic exam of optic discs and posterior segments is normal; no papilledema or hemorrhages No results found.  MUSCULOSKELETAL: Gait, strength, tone, movements noted in Neurologic exam below  NEUROLOGIC: MENTAL STATUS:  MMSE - Mini Mental State Exam 03/06/2021  Orientation to time 4  Orientation to Place 4  Registration 3  Attention/ Calculation 4  Recall 0  Language- name 2 objects 2  Language- repeat 0  Language- follow 3 step command 3  Language- read & follow direction 1  Write a sentence 1  Copy design 0  Total score 22   awake, alert, oriented to person, place and time recent and remote memory intact normal attention and concentration  language fluent, comprehension intact, naming intact fund of knowledge appropriate  CRANIAL NERVE:  2nd - no papilledema on fundoscopic exam 2nd, 3rd, 4th, 6th - pupils equal and reactive to light, visual fields full to confrontation, extraocular muscles intact, no nystagmus 5th - facial sensation symmetric 7th - facial strength symmetric 8th - hearing intact 9th - palate elevates symmetrically, uvula midline 11th - shoulder shrug symmetric 12th - tongue protrusion midline  MOTOR:  RESTING TREMOR IN RUE > LUE; HEAD TREMOR POSTURAL AND ACTION TREMOR COGWHEELING RIGHT > LEFT ARM normal bulk and tone, DIFFUSE 4+ strength in the BUE, BLE  SENSORY:  normal and symmetric to light touch, temperature, vibration; DECR IN TOES  COORDINATION:  finger-nose-finger, fine finger movements SLOW  REFLEXES:  deep tendon reflexes TRACE and symmetric  GAIT/STATION:  LEFT KNEE VALGUS; UNSTEADY GAIT     DIAGNOSTIC DATA (LABS, IMAGING, TESTING) - I reviewed patient records, labs, notes, testing and imaging myself where available.  Lab Results  Component Value Date   WBC 8.6 11/16/2020   HGB 12.7 11/16/2020   HCT 39.4 11/16/2020   MCV 88 11/16/2020   PLT 240 11/16/2020      Component Value Date/Time   NA 140  11/16/2020 1112   K 3.9 11/16/2020 1112   CL 98 11/16/2020 1112   CO2 27 11/16/2020 1112   GLUCOSE 119 (H) 11/16/2020 1112   GLUCOSE 147 (H) 11/09/2007 0430   BUN 12 11/16/2020 1112   CREATININE 0.85 11/16/2020 1112   CALCIUM 10.1 11/16/2020 1112   PROT 6.1 11/16/2020 1112   ALBUMIN 4.5 11/16/2020 1112   AST 22 11/16/2020 1112   ALT 9 11/16/2020 1112   ALKPHOS 104 11/16/2020 1112   BILITOT 1.2 11/16/2020 1112   GFRNONAA 69 05/19/2020 1100   GFRAA 80 05/19/2020 1100   Lab Results  Component Value Date   CHOL 128 07/16/2019   HDL 72 07/16/2019   LDLCALC 42 07/16/2019   TRIG 65 07/16/2019   CHOLHDL 1.8 07/16/2019   Lab Results  Component Value Date   HGBA1C 7.0 (H) 11/16/2020   No results found for: [id number] Lab Results  Component Value Date   TSH 2.280 11/16/2020    03/06/19 CT head  1. No acute intracranial or calvarial findings. 2. Right periorbital soft tissue hematoma without evidence of acute facial fracture or globe injury. 3. Advanced TMJ degenerative changes and multilevel cervical spondylosis.    ASSESSMENT AND PLAN  80 y.o. year old female here with:   Dx:  1. Memory loss     PLAN:  MEMORY LOSS (MMSE 22/30; decreased insight; concern for mild dementia; ? Dementia with lewy bodies vs Parkinson's dz with dementia) - check MRI brain - safety / supervision issues reviewed - daily physical activity / exercise (at least 15-30 minutes) - eat more plants / vegetables - increase social activities, brain stimulation, games, puzzles, hobbies, crafts, arts, music - aim for at least 7-8 hours sleep per night (or more) - avoid smoking and alcohol - caution with medications, finances, driving, living alone  PARKINSONISM - may consider carb/levo; will need better supervision before starting new meds  Orders Placed This Encounter  Procedures   MR BRAIN WO CONTRAST   Return in about 6 months (around 09/03/2021).  I spent 76 minutes of face-to-face and  non-face-to-face time with patient.  This included previsit chart review, lab review, study review, order entry, electronic health record documentation, patient education.     Suanne Marker, MD 03/06/2021, 11:41  AM Certified in Neurology, Neurophysiology and [facility] [address] [place], [place] [address] [phone]

## 2021-03-06 NOTE — Telephone Encounter (Signed)
[employer] medicare/bcbs supp order sent to [facility]. NPR they will reach out to the patient to schedule.

## 2021-03-06 NOTE — Patient Instructions (Addendum)
  MEMORY LOSS (mild dementia) - check MRI brain - safety / supervision issues reviewed - daily physical activity / exercise (at least 15-30 minutes) - eat more plants / vegetables - increase social activities, brain stimulation, games, puzzles, hobbies, crafts, arts, music - aim for at least 7-8 hours sleep per night (or more) - avoid smoking and alcohol - caregiver resources provided - caution with medications, finances, driving, living alone  PARKINSONISM - may consider carb/levo; will need better supervision before starting new meds

## 2021-03-17 ENCOUNTER — Other Ambulatory Visit: Payer: Self-pay

## 2021-03-17 ENCOUNTER — Ambulatory Visit
Admission: RE | Admit: 2021-03-17 | Discharge: 2021-03-17 | Disposition: A | Discharge status: Home / Self Care | Payer: BLUE CROSS/BLUE SHIELD | Source: Ambulatory Visit | Attending: Diagnostic Neuroimaging | Admitting: Diagnostic Neuroimaging

## 2021-03-17 DIAGNOSIS — R413 Other amnesia: Secondary | ICD-10-CM | POA: Diagnosis not present

## 2021-03-20 ENCOUNTER — Telehealth: Payer: Self-pay | Admitting: *Deleted

## 2021-03-20 NOTE — Telephone Encounter (Signed)
Called patient, sister answered. She is on DPR informed her the MRI brian showed atrophy and chronic small vessel ischemic disease. No new or major findings. Continue current plan, reviewed plan with sister Olegario Messier. She will inform patient,  verbalized understanding, appreciation.

## 2021-03-27 NOTE — Telephone Encounter (Signed)
Called son, Arlys John on [place] and informed him that patient needs to sign release of medical information for [place] to send copy of MRI results. Otherwise she can be signed up for my chart. He stated that was all he needed to know, verbalized understanding, appreciation.

## 2021-03-27 NOTE — Telephone Encounter (Signed)
Pt son called wanting to know if there was anyway we could send the results to him. Requesting a call back.

## 2021-03-30 ENCOUNTER — Other Ambulatory Visit: Payer: Self-pay | Admitting: Family

## 2021-05-19 ENCOUNTER — Ambulatory Visit (INDEPENDENT_AMBULATORY_CARE_PROVIDER_SITE_OTHER): Payer: Medicare Other | Admitting: Family

## 2021-05-19 ENCOUNTER — Encounter: Payer: Self-pay | Admitting: Family

## 2021-05-19 VITALS — BP 131/75 | HR 76 | Temp 96.7°F | Ht 64.0 in | Wt 148.0 lb

## 2021-05-19 DIAGNOSIS — I482 Chronic atrial fibrillation, unspecified: Secondary | ICD-10-CM

## 2021-05-19 DIAGNOSIS — I2583 Coronary atherosclerosis due to lipid rich plaque: Secondary | ICD-10-CM

## 2021-05-19 DIAGNOSIS — I152 Hypertension secondary to endocrine disorders: Secondary | ICD-10-CM

## 2021-05-19 DIAGNOSIS — Z23 Encounter for immunization: Secondary | ICD-10-CM | POA: Diagnosis not present

## 2021-05-19 DIAGNOSIS — F331 Major depressive disorder, recurrent, moderate: Secondary | ICD-10-CM

## 2021-05-19 DIAGNOSIS — I2510 Atherosclerotic heart disease of native coronary artery without angina pectoris: Secondary | ICD-10-CM

## 2021-05-19 DIAGNOSIS — K219 Gastro-esophageal reflux disease without esophagitis: Secondary | ICD-10-CM

## 2021-05-19 DIAGNOSIS — E1159 Type 2 diabetes mellitus with other circulatory complications: Secondary | ICD-10-CM

## 2021-05-19 DIAGNOSIS — E782 Mixed hyperlipidemia: Secondary | ICD-10-CM

## 2021-05-19 DIAGNOSIS — M159 Polyosteoarthritis, unspecified: Secondary | ICD-10-CM | POA: Diagnosis not present

## 2021-05-19 DIAGNOSIS — E1169 Type 2 diabetes mellitus with other specified complication: Secondary | ICD-10-CM | POA: Diagnosis not present

## 2021-05-19 LAB — CMP14+EGFR
ALT: 17 IU/L (ref 0–32)
AST: 32 IU/L (ref 0–40)
Albumin/Globulin Ratio: 2.1 (ref 1.2–2.2)
Albumin: 4.4 g/dL (ref 3.7–4.7)
Alkaline Phosphatase: 98 IU/L (ref 44–121)
BUN/Creatinine Ratio: 22 (ref 12–28)
BUN: 24 mg/dL (ref 8–27)
Bilirubin Total: 1.5 mg/dL — ABNORMAL HIGH (ref 0.0–1.2)
CO2: 27 mmol/L (ref 20–29)
Calcium: 10.2 mg/dL (ref 8.7–10.3)
Chloride: 96 mmol/L (ref 96–106)
Creatinine, Ser: 1.09 mg/dL — ABNORMAL HIGH (ref 0.57–1.00)
Globulin, Total: 2.1 g/dL (ref 1.5–4.5)
Glucose: 115 mg/dL — ABNORMAL HIGH (ref 70–99)
Potassium: 3.9 mmol/L (ref 3.5–5.2)
Sodium: 138 mmol/L (ref 134–144)
Total Protein: 6.5 g/dL (ref 6.0–8.5)
eGFR: 51 mL/min/{1.73_m2} — ABNORMAL LOW (ref >59)

## 2021-05-19 LAB — CBC WITH DIFFERENTIAL/PLATELET
Basophils Absolute: 0 10*3/uL (ref 0.0–0.2)
Basos: 1 %
EOS (ABSOLUTE): 0.1 10*3/uL (ref 0.0–0.4)
Eos: 2 %
Hematocrit: 38.7 % (ref 34.0–46.6)
Hemoglobin: 13.4 g/dL (ref 11.1–15.9)
Immature Grans (Abs): 0 10*3/uL (ref 0.0–0.1)
Immature Granulocytes: 0 %
Lymphocytes Absolute: 2.2 10*3/uL (ref 0.7–3.1)
Lymphs: 35 %
MCH: 29.7 pg (ref 26.6–33.0)
MCHC: 34.6 g/dL (ref 31.5–35.7)
MCV: 86 fL (ref 79–97)
Monocytes Absolute: 0.8 10*3/uL (ref 0.1–0.9)
Monocytes: 12 %
Neutrophils Absolute: 3.2 10*3/uL (ref 1.4–7.0)
Neutrophils: 50 %
Platelets: 220 10*3/uL (ref 150–450)
RBC: 4.51 x10E6/uL (ref 3.77–5.28)
RDW: 13.1 % (ref 11.7–15.4)
WBC: 6.3 10*3/uL (ref 3.4–10.8)

## 2021-05-19 LAB — BAYER DCA HB A1C WAIVED: HB A1C (BAYER DCA - WAIVED): 6.3 % — ABNORMAL HIGH (ref 4.8–5.6)

## 2021-05-19 NOTE — Patient Instructions (Signed)
Tremor °A tremor is trembling or shaking that you cannot control. Most tremors affect the hands or arms. Tremors can also affect the head, vocal cords, face, and other parts of the body. There are many types of tremors. Common types include: °Essential tremor. These usually occur in people older than 40. It may run in families and can happen in otherwise healthy people. °Resting tremor. These occur when the muscles are at rest, such as when your hands are resting in your lap. People with Parkinson's disease often have resting tremors. °Postural tremor. These occur when you try to hold a pose, such as keeping your hands outstretched. °Kinetic tremor. These occur during purposeful movement, such as trying to touch a finger to your nose. °Task-specific tremor. These may occur when you perform certain tasks such as writing, speaking, or standing. °Psychogenic tremor. These dramatically lessen or disappear when you are distracted. They can happen in people of all ages. °Some types of tremors have no known cause. Tremors can also be a symptom of nervous system problems (neurological disorders) that may occur with aging. Some tremors go away with treatment, while others do not. °Follow these instructions at home: °Lifestyle °  °Limit alcohol intake to no more than 1 drink a day for nonpregnant women and 2 drinks a day for men. One drink equals 12 oz of beer, 5 oz of wine, or 1½ oz of hard liquor. °Do not use any products that contain nicotine or tobacco, such as cigarettes and e-cigarettes. If you need help quitting, ask your health care provider. °Avoid extreme heat and extreme cold. °Limit your caffeine intake, as told by your health care provider. °Try to get 8 hours of sleep each night. °Find ways to manage your stress, such as meditation or yoga. °General instructions °Take over-the-counter and prescription medicines only as told by your health care provider. °Keep all follow-up visits as told by your health care  provider. This is important. °Contact a health care provider if you: °Develop a tremor after starting a new medicine. °Have a tremor along with other symptoms such as: °Numbness. °Tingling. °Pain. °Weakness. °Notice that your tremor gets worse. °Notice that your tremor interferes with your day-to-day life. °Summary °A tremor is trembling or shaking that you cannot control. °Most tremors affect the hands or arms. °Some types of tremors have no known cause. Others may be a symptom of nervous system problems (neurological disorders). °Make sure you discuss any tremors you have with your health care provider. °This information is not intended to replace advice given to you by your health care provider. Make sure you discuss any questions you have with your health care provider. °Document Revised: 02/24/2020 Document Reviewed: 02/26/2020 °Elsevier Patient Education © 2022 [employer] ° °

## 2021-05-19 NOTE — Progress Notes (Signed)
Subjective:    Patient ID: Virginia [name], female    DOB: 05/12/1941, 80 y.o.   MRN: [id number]  Chief Complaint  Patient presents with   Medical Management of Chronic Issues   PT presents to the office today for chronic follow up. She states has a Cardiologists for CAD,& A Fib, but has not seen him in a few years. Pt does take Eliquis BID.    Pt is followed by Neurologists for tremors and had a MRI of head on 03/17/21 that showed, "1.   Mild generalized cortical atrophy that is most pronounced in the medial temporal lobes.  This is slightly progressed compared to the 2020 CT scan. 2.   T2/FLAIR hyperintense foci in the hemispheres and pons consistent with moderate chronic microvascular ischemic change.  None of the foci appear to be acute.  Small lacunar infarctions are noted in the right cerebellar hemisphere and the right caudate head.  The chronic ischemic changes appear stable compared to the previous CT scan. 3.   No acute findings." Hypertension This is a chronic problem. The problem has been resolved since onset. The problem is controlled. Pertinent negatives include no blurred vision, malaise/fatigue, peripheral edema or shortness of breath. Risk factors for coronary artery disease include dyslipidemia and diabetes mellitus. The current treatment provides moderate improvement.  Gastroesophageal Reflux She complains of belching and heartburn. This is a chronic problem. The current episode started more than 1 year ago. The problem occurs occasionally. She has tried a diet change for the symptoms. The treatment provided moderate relief.  Diabetes She presents for her follow-up diabetic visit. She has type 2 diabetes mellitus. Pertinent negatives for diabetes include no blurred vision and no foot paresthesias. Diabetic complications include heart disease. Risk factors for coronary artery disease include dyslipidemia, diabetes mellitus, hypertension, sedentary lifestyle and post-menopausal.  (Does not check BS at home) Eye exam is not current.  Arthritis Presents for follow-up visit. She complains of pain and stiffness. Affected locations include the left MCP, right MCP, right knee and left knee. Her pain is at a severity of 2/10.  Depression        This is a chronic problem.  The current episode started more than 1 year ago.   The onset quality is gradual.   The problem occurs intermittently.  Associated symptoms include no helplessness, no hopelessness, not irritable, no restlessness and not sad.  Past treatments include SSRIs - Selective serotonin reuptake inhibitors. Hyperlipidemia This is a chronic problem. The current episode started more than 1 year ago. Exacerbating diseases include obesity. Pertinent negatives include no shortness of breath. Current antihyperlipidemic treatment includes statins. The current treatment provides moderate improvement of lipids. Risk factors for coronary artery disease include dyslipidemia, diabetes mellitus, hypertension and a sedentary lifestyle.     Review of Systems  Constitutional:  Negative for malaise/fatigue.  Eyes:  Negative for blurred vision.  Respiratory:  Negative for shortness of breath.   Gastrointestinal:  Positive for heartburn.  Musculoskeletal:  Positive for arthritis and stiffness.  Psychiatric/Behavioral:  Positive for depression.   All other systems reviewed and are negative.     Objective:   Physical Exam Vitals reviewed.  Constitutional:      General: She is not irritable.She is not in acute distress.    Appearance: She is well-developed.  HENT:     Head: Normocephalic and atraumatic.     Right Ear: Tympanic membrane normal.     Left Ear: Tympanic membrane normal.  Eyes:  Pupils: Pupils are equal, round, and reactive to light.  Neck:     Thyroid: No thyromegaly.  Cardiovascular:     Rate and Rhythm: Normal rate and regular rhythm.     Heart sounds: Normal heart sounds. No murmur heard. Pulmonary:      Effort: Pulmonary effort is normal. No respiratory distress.     Breath sounds: Normal breath sounds. No wheezing.  Abdominal:     General: Bowel sounds are normal. There is no distension.     Palpations: Abdomen is soft.     Tenderness: There is no abdominal tenderness.  Musculoskeletal:        General: No tenderness. Normal range of motion.     Cervical back: Normal range of motion and neck supple.  Skin:    General: Skin is warm and dry.  Neurological:     Mental Status: She is alert and oriented to person, place, and time.     Cranial Nerves: No cranial nerve deficit.     Deep Tendon Reflexes: Reflexes are normal and symmetric.  Psychiatric:        Behavior: Behavior normal.        Thought Content: Thought content normal.        Judgment: Judgment normal.      BP 131/75   Pulse 76   Temp (!) 96.7 F (35.9 C) (Temporal)   Ht '5\' 4"'  (1.626 m)   Wt 148 lb (67.1 kg)   BMI 25.40 kg/m      Assessment & Plan:  Virginia [name] comes in today with chief complaint of Medical Management of Chronic Issues   Diagnosis and orders addressed:  1. Need for immunization against influenza - Flu Vaccine QUAD High Dose(Fluad) - CMP14+EGFR - CBC with Differential/Platelet  2. Hypertension associated with diabetes ([place]) - CMP14+EGFR - CBC with Differential/Platelet  3. Gastroesophageal reflux disease, unspecified whether esophagitis present - CMP14+EGFR - CBC with Differential/Platelet  4. Type 2 diabetes mellitus with other specified complication, without long-term current use of insulin (HCC) - CMP14+EGFR - CBC with Differential/Platelet - Bayer DCA Hb A1c Waived  5. Primary osteoarthritis involving multiple joints - CMP14+EGFR - CBC with Differential/Platelet  6. Mixed hyperlipidemia - CMP14+EGFR - CBC with Differential/Platelet  7. Moderate episode of recurrent major depressive disorder (HCC) - CMP14+EGFR - CBC with Differential/Platelet  8. Coronary artery  disease due to lipid rich plaque - CMP14+EGFR - CBC with Differential/Platelet  9. Chronic atrial fibrillation (HCC) - CMP14+EGFR - CBC with Differential/Platelet   Labs pending Health Maintenance reviewed Diet and exercise encouraged  Follow up plan: 3 months    Evelina Dun, FNP

## 2021-05-25 ENCOUNTER — Other Ambulatory Visit: Payer: Self-pay | Admitting: Family

## 2021-07-03 ENCOUNTER — Other Ambulatory Visit: Payer: Self-pay | Admitting: Family

## 2021-08-17 ENCOUNTER — Ambulatory Visit (INDEPENDENT_AMBULATORY_CARE_PROVIDER_SITE_OTHER): Payer: Medicare Other | Admitting: Family

## 2021-08-17 ENCOUNTER — Other Ambulatory Visit: Payer: Self-pay

## 2021-08-17 ENCOUNTER — Ambulatory Visit (INDEPENDENT_AMBULATORY_CARE_PROVIDER_SITE_OTHER): Payer: Medicare Other

## 2021-08-17 ENCOUNTER — Ambulatory Visit (HOSPITAL_COMMUNITY)
Admission: RE | Admit: 2021-08-17 | Discharge: 2021-08-17 | Disposition: A | Discharge status: Home / Self Care | Payer: Medicare Other | Source: Ambulatory Visit | Attending: Family | Admitting: Family

## 2021-08-17 ENCOUNTER — Encounter: Payer: Self-pay | Admitting: Family

## 2021-08-17 VITALS — BP 107/59 | HR 87 | Temp 97.7°F

## 2021-08-17 DIAGNOSIS — K315 Obstruction of duodenum: Secondary | ICD-10-CM | POA: Diagnosis not present

## 2021-08-17 DIAGNOSIS — K449 Diaphragmatic hernia without obstruction or gangrene: Secondary | ICD-10-CM | POA: Diagnosis not present

## 2021-08-17 DIAGNOSIS — R0602 Shortness of breath: Secondary | ICD-10-CM | POA: Insufficient documentation

## 2021-08-17 DIAGNOSIS — S22080A Wedge compression fracture of T11-T12 vertebra, initial encounter for closed fracture: Secondary | ICD-10-CM | POA: Diagnosis not present

## 2021-08-17 DIAGNOSIS — R6889 Other general symptoms and signs: Secondary | ICD-10-CM | POA: Diagnosis not present

## 2021-08-17 DIAGNOSIS — D500 Iron deficiency anemia secondary to blood loss (chronic): Secondary | ICD-10-CM | POA: Diagnosis not present

## 2021-08-17 DIAGNOSIS — Z96651 Presence of right artificial knee joint: Secondary | ICD-10-CM | POA: Diagnosis not present

## 2021-08-17 DIAGNOSIS — I4891 Unspecified atrial fibrillation: Secondary | ICD-10-CM | POA: Diagnosis not present

## 2021-08-17 DIAGNOSIS — Z8249 Family history of ischemic heart disease and other diseases of the circulatory system: Secondary | ICD-10-CM | POA: Diagnosis not present

## 2021-08-17 DIAGNOSIS — K2971 Gastritis, unspecified, with bleeding: Secondary | ICD-10-CM | POA: Diagnosis not present

## 2021-08-17 DIAGNOSIS — F32A Depression, unspecified: Secondary | ICD-10-CM | POA: Diagnosis not present

## 2021-08-17 DIAGNOSIS — R739 Hyperglycemia, unspecified: Secondary | ICD-10-CM | POA: Diagnosis not present

## 2021-08-17 DIAGNOSIS — R404 Transient alteration of awareness: Secondary | ICD-10-CM | POA: Diagnosis not present

## 2021-08-17 DIAGNOSIS — R251 Tremor, unspecified: Secondary | ICD-10-CM

## 2021-08-17 DIAGNOSIS — I482 Chronic atrial fibrillation, unspecified: Secondary | ICD-10-CM | POA: Diagnosis not present

## 2021-08-17 DIAGNOSIS — R799 Abnormal finding of blood chemistry, unspecified: Secondary | ICD-10-CM | POA: Diagnosis not present

## 2021-08-17 DIAGNOSIS — K259 Gastric ulcer, unspecified as acute or chronic, without hemorrhage or perforation: Secondary | ICD-10-CM | POA: Diagnosis not present

## 2021-08-17 DIAGNOSIS — K921 Melena: Secondary | ICD-10-CM | POA: Diagnosis not present

## 2021-08-17 DIAGNOSIS — Z743 Need for continuous supervision: Secondary | ICD-10-CM | POA: Diagnosis not present

## 2021-08-17 DIAGNOSIS — I10 Essential (primary) hypertension: Secondary | ICD-10-CM | POA: Diagnosis not present

## 2021-08-17 DIAGNOSIS — R7981 Abnormal blood-gas level: Secondary | ICD-10-CM

## 2021-08-17 DIAGNOSIS — Z823 Family history of stroke: Secondary | ICD-10-CM | POA: Diagnosis not present

## 2021-08-17 DIAGNOSIS — J984 Other disorders of lung: Secondary | ICD-10-CM | POA: Diagnosis not present

## 2021-08-17 DIAGNOSIS — K264 Chronic or unspecified duodenal ulcer with hemorrhage: Secondary | ICD-10-CM | POA: Diagnosis not present

## 2021-08-17 DIAGNOSIS — I517 Cardiomegaly: Secondary | ICD-10-CM | POA: Diagnosis not present

## 2021-08-17 DIAGNOSIS — Z951 Presence of aortocoronary bypass graft: Secondary | ICD-10-CM | POA: Diagnosis not present

## 2021-08-17 DIAGNOSIS — I809 Phlebitis and thrombophlebitis of unspecified site: Secondary | ICD-10-CM | POA: Diagnosis not present

## 2021-08-17 DIAGNOSIS — R531 Weakness: Secondary | ICD-10-CM | POA: Diagnosis not present

## 2021-08-17 DIAGNOSIS — Z7901 Long term (current) use of anticoagulants: Secondary | ICD-10-CM | POA: Diagnosis not present

## 2021-08-17 DIAGNOSIS — E785 Hyperlipidemia, unspecified: Secondary | ICD-10-CM | POA: Diagnosis not present

## 2021-08-17 DIAGNOSIS — K219 Gastro-esophageal reflux disease without esophagitis: Secondary | ICD-10-CM | POA: Diagnosis not present

## 2021-08-17 DIAGNOSIS — K269 Duodenal ulcer, unspecified as acute or chronic, without hemorrhage or perforation: Secondary | ICD-10-CM | POA: Diagnosis not present

## 2021-08-17 DIAGNOSIS — I2510 Atherosclerotic heart disease of native coronary artery without angina pectoris: Secondary | ICD-10-CM | POA: Diagnosis not present

## 2021-08-17 DIAGNOSIS — E876 Hypokalemia: Secondary | ICD-10-CM | POA: Diagnosis not present

## 2021-08-17 DIAGNOSIS — K254 Chronic or unspecified gastric ulcer with hemorrhage: Secondary | ICD-10-CM | POA: Diagnosis not present

## 2021-08-17 DIAGNOSIS — R0609 Other forms of dyspnea: Secondary | ICD-10-CM | POA: Diagnosis not present

## 2021-08-17 DIAGNOSIS — Z20822 Contact with and (suspected) exposure to covid-19: Secondary | ICD-10-CM | POA: Diagnosis not present

## 2021-08-17 DIAGNOSIS — S2242XA Multiple fractures of ribs, left side, initial encounter for closed fracture: Secondary | ICD-10-CM | POA: Diagnosis not present

## 2021-08-17 DIAGNOSIS — I739 Peripheral vascular disease, unspecified: Secondary | ICD-10-CM | POA: Diagnosis not present

## 2021-08-17 DIAGNOSIS — E119 Type 2 diabetes mellitus without complications: Secondary | ICD-10-CM | POA: Diagnosis not present

## 2021-08-17 DIAGNOSIS — D649 Anemia, unspecified: Secondary | ICD-10-CM | POA: Diagnosis not present

## 2021-08-17 DIAGNOSIS — K2970 Gastritis, unspecified, without bleeding: Secondary | ICD-10-CM | POA: Diagnosis not present

## 2021-08-17 DIAGNOSIS — R944 Abnormal results of kidney function studies: Secondary | ICD-10-CM | POA: Diagnosis not present

## 2021-08-17 DIAGNOSIS — K922 Gastrointestinal hemorrhage, unspecified: Secondary | ICD-10-CM | POA: Diagnosis not present

## 2021-08-17 DIAGNOSIS — I499 Cardiac arrhythmia, unspecified: Secondary | ICD-10-CM | POA: Diagnosis not present

## 2021-08-17 LAB — POCT I-STAT CREATININE: Creatinine, Ser: 1 mg/dL (ref 0.44–1.00)

## 2021-08-17 MED ORDER — IOHEXOL 350 MG/ML SOLN
80.0000 mL | Freq: Once | INTRAVENOUS | Status: AC | PRN
  Administered 2021-08-17: 80 mL via INTRAVENOUS

## 2021-08-17 NOTE — Progress Notes (Signed)
? ?Subjective:  ? ? Patient ID: Virginia [name], female    DOB: [birth year], 81 y.o.   MRN: [id number] ? ?Chief Complaint  ?Patient presents with  ? Fatigue  ? Shortness of Breath  ?  O2 started at 87 wend up to 95 then will go back down and back up again when rooming patient   ? Tremors  ? Gastroesophageal Reflux  ? ?PT presents to the office today with SOB that started last week. She has tremors and has seen neurologists. She had a MRI that showed chronic ischemia. She reports since being sick her tremors are worse.  ?Shortness of Breath ?This is a new problem. The current episode started yesterday. The problem occurs intermittently. Associated symptoms include chest pain (yesterday, but improved). Pertinent negatives include no ear pain, fever, headaches, neck pain, orthopnea or vomiting. The symptoms are aggravated by exercise. Associated symptoms comments: Cough ?Marland Kitchen She has tried rest for the symptoms. The treatment provided mild relief.  ?Gastroesophageal Reflux ?She complains of chest pain (yesterday, but improved).  ? ? ? ?Review of Systems  ?Constitutional:  Negative for fever.  ?HENT:  Negative for ear pain.   ?Respiratory:  Positive for shortness of breath.   ?Cardiovascular:  Positive for chest pain (yesterday, but improved). Negative for orthopnea.  ?Gastrointestinal:  Negative for vomiting.  ?Musculoskeletal:  Negative for neck pain.  ?Neurological:  Negative for headaches.  ?All other systems reviewed and are negative. ? ?   ?Objective:  ? Physical Exam ?Vitals reviewed.  ?Constitutional:   ?   General: She is not in acute distress. ?   Appearance: She is well-developed.  ?HENT:  ?   Head: Normocephalic and atraumatic.  ?   Right Ear: External ear normal.  ?Eyes:  ?   Pupils: Pupils are equal, round, and reactive to light.  ?Neck:  ?   Thyroid: No thyromegaly.  ?Cardiovascular:  ?   Rate and Rhythm: Normal rate and regular rhythm.  ?   Heart sounds: Normal heart sounds. No murmur heard. ?Pulmonary:  ?    Effort: Pulmonary effort is normal. No respiratory distress.  ?   Breath sounds: Decreased breath sounds present. No wheezing.  ?   Comments: SOB with exertion  ?Abdominal:  ?   General: Bowel sounds are normal. There is no distension.  ?   Palpations: Abdomen is soft.  ?   Tenderness: There is no abdominal tenderness.  ?Musculoskeletal:     ?   General: No tenderness. Normal range of motion.  ?   Cervical back: Normal range of motion and neck supple.  ?   Right lower leg: No edema.  ?   Left lower leg: No edema.  ?Skin: ?   General: Skin is warm and dry.  ?Neurological:  ?   Mental Status: She is alert and oriented to person, place, and time.  ?   Cranial Nerves: No cranial nerve deficit.  ?   Deep Tendon Reflexes: Reflexes are normal and symmetric.  ?   Comments: Resting tremor in hand  ?Psychiatric:     ?   Behavior: Behavior normal.     ?   Thought Content: Thought content normal.     ?   Judgment: Judgment normal.  ? ? ? ? ?BP (!) 107/59   Pulse (!) 104   Temp 97.7 ?F (36.5 ?C) (Temporal)   SpO2 (!) 87%  ? ?   ?Assessment & Plan:  ?CHARMEL PRONOVOST comes in today with  chief complaint of Fatigue, Shortness of Breath (O2 started at 87 wend up to 95 then will go back down and back up again when rooming patient ), Tremors, and Gastroesophageal Reflux ? ? ?Diagnosis and orders addressed: ? ?1. SOB (shortness of breath) ?Given SOB and low O2 will do CT to rule out PE.  ?- DG Chest 2 View ?- CMP14+EGFR ?- CBC with Differential/Platelet ?- CT Angio Chest Pulmonary Embolism (PE) W or WO Contrast; Future ? ?2. Tremor ?Will hold off on medications at this time as we are dealing with new onset SOB.  ?- DG Chest 2 View ?- CMP14+EGFR ?- CBC with Differential/Platelet ? ?3. Low oxygen saturation ?- CMP14+EGFR ?- CBC with Differential/Platelet ?- CT Angio Chest Pulmonary Embolism (PE) W or WO Contrast; Future ? ? ?Labs pending ?Approx 45 mins spent with patient and family. Stat CT ordered.  ?Health Maintenance reviewed ?Diet  and exercise encouraged ? ?Follow up plan: ?1-2 weeks for chronic follow up  ? ? ?Virginia Dun, FNP ? ? ? ?

## 2021-08-17 NOTE — Patient Instructions (Signed)
Tremor °A tremor is trembling or shaking that you cannot control. Most tremors affect the hands or arms. Tremors can also affect the head, vocal cords, face, and other parts of the body. There are many types of tremors. Common types include: °Essential tremor. These usually occur in people older than 40. It may run in families and can happen in otherwise healthy people. °Resting tremor. These occur when the muscles are at rest, such as when your hands are resting in your lap. People with Parkinson's disease often have resting tremors. °Postural tremor. These occur when you try to hold a pose, such as keeping your hands outstretched. °Kinetic tremor. These occur during purposeful movement, such as trying to touch a finger to your nose. °Task-specific tremor. These may occur when you perform certain tasks such as writing, speaking, or standing. °Psychogenic tremor. These dramatically lessen or disappear when you are distracted. They can happen in people of all ages. °Some types of tremors have no known cause. Tremors can also be a symptom of nervous system problems (neurological disorders) that may occur with aging. Some tremors go away with treatment, while others do not. °Follow these instructions at home: °Lifestyle °  °Limit alcohol intake to no more than 1 drink a day for nonpregnant women and 2 drinks a day for men. One drink equals 12 oz of beer, 5 oz of wine, or 1½ oz of hard liquor. °Do not use any products that contain nicotine or tobacco, such as cigarettes and e-cigarettes. If you need help quitting, ask your health care provider. °Avoid extreme heat and extreme cold. °Limit your caffeine intake, as told by your health care provider. °Try to get 8 hours of sleep each night. °Find ways to manage your stress, such as meditation or yoga. °General instructions °Take over-the-counter and prescription medicines only as told by your health care provider. °Keep all follow-up visits as told by your health care  provider. This is important. °Contact a health care provider if you: °Develop a tremor after starting a new medicine. °Have a tremor along with other symptoms such as: °Numbness. °Tingling. °Pain. °Weakness. °Notice that your tremor gets worse. °Notice that your tremor interferes with your day-to-day life. °Summary °A tremor is trembling or shaking that you cannot control. °Most tremors affect the hands or arms. °Some types of tremors have no known cause. Others may be a symptom of nervous system problems (neurological disorders). °Make sure you discuss any tremors you have with your health care provider. °This information is not intended to replace advice given to you by your health care provider. Make sure you discuss any questions you have with your health care provider. °Document Revised: 02/24/2020 Document Reviewed: 02/26/2020 °Elsevier Patient Education © 2022 [employer] ° °

## 2021-08-18 ENCOUNTER — Inpatient Hospital Stay (HOSPITAL_COMMUNITY)
Admission: EM | Admit: 2021-08-18 | Discharge: 2021-08-21 | DRG: 378 | Disposition: A | Discharge status: Home / Self Care | Payer: Medicare Other | Attending: Internal Medicine | Admitting: Internal Medicine

## 2021-08-18 ENCOUNTER — Other Ambulatory Visit: Payer: Self-pay

## 2021-08-18 ENCOUNTER — Emergency Department (HOSPITAL_COMMUNITY): Payer: Medicare Other

## 2021-08-18 ENCOUNTER — Other Ambulatory Visit: Payer: Self-pay | Admitting: Family

## 2021-08-18 ENCOUNTER — Encounter (HOSPITAL_COMMUNITY): Payer: Self-pay

## 2021-08-18 DIAGNOSIS — R799 Abnormal finding of blood chemistry, unspecified: Secondary | ICD-10-CM | POA: Diagnosis present

## 2021-08-18 DIAGNOSIS — K921 Melena: Secondary | ICD-10-CM | POA: Diagnosis not present

## 2021-08-18 DIAGNOSIS — K264 Chronic or unspecified duodenal ulcer with hemorrhage: Secondary | ICD-10-CM | POA: Diagnosis present

## 2021-08-18 DIAGNOSIS — Z8249 Family history of ischemic heart disease and other diseases of the circulatory system: Secondary | ICD-10-CM | POA: Diagnosis not present

## 2021-08-18 DIAGNOSIS — Z20822 Contact with and (suspected) exposure to covid-19: Secondary | ICD-10-CM | POA: Diagnosis present

## 2021-08-18 DIAGNOSIS — E785 Hyperlipidemia, unspecified: Secondary | ICD-10-CM | POA: Diagnosis present

## 2021-08-18 DIAGNOSIS — K449 Diaphragmatic hernia without obstruction or gangrene: Secondary | ICD-10-CM | POA: Diagnosis present

## 2021-08-18 DIAGNOSIS — K315 Obstruction of duodenum: Secondary | ICD-10-CM | POA: Diagnosis not present

## 2021-08-18 DIAGNOSIS — K2971 Gastritis, unspecified, with bleeding: Secondary | ICD-10-CM | POA: Diagnosis present

## 2021-08-18 DIAGNOSIS — D500 Iron deficiency anemia secondary to blood loss (chronic): Secondary | ICD-10-CM | POA: Diagnosis present

## 2021-08-18 DIAGNOSIS — Z7901 Long term (current) use of anticoagulants: Secondary | ICD-10-CM

## 2021-08-18 DIAGNOSIS — R251 Tremor, unspecified: Secondary | ICD-10-CM | POA: Diagnosis present

## 2021-08-18 DIAGNOSIS — Z823 Family history of stroke: Secondary | ICD-10-CM

## 2021-08-18 DIAGNOSIS — I10 Essential (primary) hypertension: Secondary | ICD-10-CM | POA: Diagnosis present

## 2021-08-18 DIAGNOSIS — R944 Abnormal results of kidney function studies: Secondary | ICD-10-CM | POA: Diagnosis present

## 2021-08-18 DIAGNOSIS — I809 Phlebitis and thrombophlebitis of unspecified site: Secondary | ICD-10-CM

## 2021-08-18 DIAGNOSIS — K2970 Gastritis, unspecified, without bleeding: Secondary | ICD-10-CM | POA: Diagnosis not present

## 2021-08-18 DIAGNOSIS — R0609 Other forms of dyspnea: Secondary | ICD-10-CM | POA: Diagnosis present

## 2021-08-18 DIAGNOSIS — F32A Depression, unspecified: Secondary | ICD-10-CM | POA: Diagnosis present

## 2021-08-18 DIAGNOSIS — E119 Type 2 diabetes mellitus without complications: Secondary | ICD-10-CM | POA: Diagnosis present

## 2021-08-18 DIAGNOSIS — R531 Weakness: Secondary | ICD-10-CM

## 2021-08-18 DIAGNOSIS — Z79899 Other long term (current) drug therapy: Secondary | ICD-10-CM

## 2021-08-18 DIAGNOSIS — I482 Chronic atrial fibrillation, unspecified: Secondary | ICD-10-CM | POA: Diagnosis present

## 2021-08-18 DIAGNOSIS — K922 Gastrointestinal hemorrhage, unspecified: Secondary | ICD-10-CM | POA: Diagnosis present

## 2021-08-18 DIAGNOSIS — Z96651 Presence of right artificial knee joint: Secondary | ICD-10-CM | POA: Diagnosis present

## 2021-08-18 DIAGNOSIS — K219 Gastro-esophageal reflux disease without esophagitis: Secondary | ICD-10-CM | POA: Diagnosis present

## 2021-08-18 DIAGNOSIS — I739 Peripheral vascular disease, unspecified: Secondary | ICD-10-CM | POA: Diagnosis present

## 2021-08-18 DIAGNOSIS — D649 Anemia, unspecified: Secondary | ICD-10-CM | POA: Diagnosis present

## 2021-08-18 DIAGNOSIS — K254 Chronic or unspecified gastric ulcer with hemorrhage: Secondary | ICD-10-CM | POA: Diagnosis present

## 2021-08-18 DIAGNOSIS — Z791 Long term (current) use of non-steroidal anti-inflammatories (NSAID): Secondary | ICD-10-CM

## 2021-08-18 DIAGNOSIS — E876 Hypokalemia: Secondary | ICD-10-CM | POA: Diagnosis present

## 2021-08-18 DIAGNOSIS — Z951 Presence of aortocoronary bypass graft: Secondary | ICD-10-CM

## 2021-08-18 DIAGNOSIS — K269 Duodenal ulcer, unspecified as acute or chronic, without hemorrhage or perforation: Secondary | ICD-10-CM | POA: Diagnosis not present

## 2021-08-18 DIAGNOSIS — K259 Gastric ulcer, unspecified as acute or chronic, without hemorrhage or perforation: Secondary | ICD-10-CM | POA: Diagnosis not present

## 2021-08-18 DIAGNOSIS — M1990 Unspecified osteoarthritis, unspecified site: Secondary | ICD-10-CM | POA: Diagnosis present

## 2021-08-18 DIAGNOSIS — Z8719 Personal history of other diseases of the digestive system: Secondary | ICD-10-CM | POA: Diagnosis present

## 2021-08-18 LAB — CBC WITH DIFFERENTIAL/PLATELET
Abs Immature Granulocytes: 0.04 10*3/uL (ref 0.00–0.07)
Basophils Absolute: 0 10*3/uL (ref 0.0–0.2)
Basophils Absolute: 0 10*3/uL (ref 0.0–0.1)
Basophils Relative: 0 %
Basos: 0 %
EOS (ABSOLUTE): 0 10*3/uL (ref 0.0–0.4)
Eos: 0 %
Eosinophils Absolute: 0 10*3/uL (ref 0.0–0.5)
Eosinophils Relative: 0 %
HCT: 18.1 % — ABNORMAL LOW (ref 36.0–46.0)
Hematocrit: 25 % — ABNORMAL LOW (ref 34.0–46.6)
Hemoglobin: 8.4 g/dL — CL (ref 11.1–15.9)
Hemoglobin: 6.3 g/dL — CL (ref 12.0–15.0)
Immature Grans (Abs): 0 10*3/uL (ref 0.0–0.1)
Immature Granulocytes: 0 %
Immature Granulocytes: 0 %
Lymphocytes Absolute: 1 10*3/uL (ref 0.7–3.1)
Lymphocytes Relative: 22 %
Lymphs Abs: 2.3 10*3/uL (ref 0.7–4.0)
Lymphs: 11 %
MCH: 30.8 pg (ref 26.6–33.0)
MCH: 32.5 pg (ref 26.0–34.0)
MCHC: 33.6 g/dL (ref 31.5–35.7)
MCHC: 34.8 g/dL (ref 30.0–36.0)
MCV: 92 fL (ref 79–97)
MCV: 93.3 fL (ref 80.0–100.0)
Monocytes Absolute: 0.2 10*3/uL (ref 0.1–0.9)
Monocytes Absolute: 0.6 10*3/uL (ref 0.1–1.0)
Monocytes Relative: 6 %
Monocytes: 2 %
Neutro Abs: 7.6 10*3/uL (ref 1.7–7.7)
Neutrophils Absolute: 7.8 10*3/uL — ABNORMAL HIGH (ref 1.4–7.0)
Neutrophils Relative %: 72 %
Neutrophils: 87 %
Platelets: 196 10*3/uL (ref 150–450)
Platelets: 176 10*3/uL (ref 150–400)
RBC: 2.73 x10E6/uL — CL (ref 3.77–5.28)
RBC: 1.94 MIL/uL — ABNORMAL LOW (ref 3.87–5.11)
RDW: 13.2 % (ref 11.7–15.4)
RDW: 14.6 % (ref 11.5–15.5)
WBC: 9 10*3/uL (ref 3.4–10.8)
WBC: 10.5 10*3/uL (ref 4.0–10.5)
nRBC: 0 % (ref 0.0–0.2)

## 2021-08-18 LAB — PROTIME-INR
INR: 1.5 — ABNORMAL HIGH (ref 0.8–1.2)
Prothrombin Time: 18 seconds — ABNORMAL HIGH (ref 11.4–15.2)

## 2021-08-18 LAB — RESP PANEL BY RT-PCR (FLU A&B, COVID) ARPGX2
Influenza A by PCR: NEGATIVE
Influenza B by PCR: NEGATIVE
SARS Coronavirus 2 by RT PCR: NEGATIVE

## 2021-08-18 LAB — COMPREHENSIVE METABOLIC PANEL
ALT: 14 U/L (ref 0–44)
AST: 19 U/L (ref 15–41)
Albumin: 3.5 g/dL (ref 3.5–5.0)
Alkaline Phosphatase: 43 U/L (ref 38–126)
Anion gap: 10 (ref 5–15)
BUN: 60 mg/dL — ABNORMAL HIGH (ref 8–23)
CO2: 26 mmol/L (ref 22–32)
Calcium: 9.6 mg/dL (ref 8.9–10.3)
Chloride: 100 mmol/L (ref 98–111)
Creatinine, Ser: 0.76 mg/dL (ref 0.44–1.00)
GFR, Estimated: >60 mL/min (ref >60)
Glucose, Bld: 167 mg/dL — ABNORMAL HIGH (ref 70–99)
Potassium: 3.2 mmol/L — ABNORMAL LOW (ref 3.5–5.1)
Sodium: 136 mmol/L (ref 135–145)
Total Bilirubin: 0.7 mg/dL (ref 0.3–1.2)
Total Protein: 5.8 g/dL — ABNORMAL LOW (ref 6.5–8.1)

## 2021-08-18 LAB — CMP14+EGFR
ALT: 12 IU/L (ref 0–32)
AST: 20 IU/L (ref 0–40)
Albumin/Globulin Ratio: 2.8 — ABNORMAL HIGH (ref 1.2–2.2)
Albumin: 4.2 g/dL (ref 3.7–4.7)
Alkaline Phosphatase: 56 IU/L (ref 44–121)
BUN/Creatinine Ratio: 64 — ABNORMAL HIGH (ref 12–28)
BUN: 52 mg/dL — ABNORMAL HIGH (ref 8–27)
Bilirubin Total: 0.9 mg/dL (ref 0.0–1.2)
CO2: 25 mmol/L (ref 20–29)
Calcium: 10.5 mg/dL — ABNORMAL HIGH (ref 8.7–10.3)
Chloride: 99 mmol/L (ref 96–106)
Creatinine, Ser: 0.81 mg/dL (ref 0.57–1.00)
Globulin, Total: 1.5 g/dL (ref 1.5–4.5)
Glucose: 150 mg/dL — ABNORMAL HIGH (ref 70–99)
Potassium: 4.2 mmol/L (ref 3.5–5.2)
Sodium: 141 mmol/L (ref 134–144)
Total Protein: 5.7 g/dL — ABNORMAL LOW (ref 6.0–8.5)
eGFR: 73 mL/min/{1.73_m2} (ref >59)

## 2021-08-18 LAB — APTT: aPTT: 29 seconds (ref 24–36)

## 2021-08-18 LAB — POC OCCULT BLOOD, ED: Fecal Occult Bld: POSITIVE — AB

## 2021-08-18 LAB — PREPARE RBC (CROSSMATCH)

## 2021-08-18 MED ORDER — PROTHROMBIN COMPLEX CONC HUMAN 500 UNITS IV KIT
3258.0000 [IU] | PACK | Status: AC
  Administered 2021-08-18: 3258 [IU] via INTRAVENOUS
  Filled 2021-08-18: qty 3258

## 2021-08-18 MED ORDER — SODIUM CHLORIDE 0.9 % IV SOLN
10.0000 mL/h | Freq: Once | INTRAVENOUS | Status: AC
  Administered 2021-08-18: 10 mL/h via INTRAVENOUS

## 2021-08-18 MED ORDER — POTASSIUM CHLORIDE 10 MEQ/100ML IV SOLN
10.0000 meq | INTRAVENOUS | Status: AC
  Administered 2021-08-18 – 2021-08-19 (×3): 10 meq via INTRAVENOUS
  Filled 2021-08-18 (×3): qty 100

## 2021-08-18 MED ORDER — PANTOPRAZOLE INFUSION (NEW) - SIMPLE MED
8.0000 mg/h | INTRAVENOUS | Status: DC
Start: 2021-08-18 — End: 2021-08-19
  Administered 2021-08-18 – 2021-08-19 (×2): 8 mg/h via INTRAVENOUS
  Filled 2021-08-18 (×5): qty 100

## 2021-08-18 MED ORDER — METOCLOPRAMIDE HCL 10 MG PO TABS
10.0000 mg | ORAL_TABLET | Freq: Once | ORAL | Status: DC
  Filled 2021-08-18: qty 1

## 2021-08-18 MED ORDER — PANTOPRAZOLE 80MG IVPB - SIMPLE MED
80.0000 mg | Freq: Once | INTRAVENOUS | Status: AC
Start: 2021-08-18 — End: 2021-08-18
  Administered 2021-08-18: 80 mg via INTRAVENOUS
  Filled 2021-08-18: qty 100

## 2021-08-18 MED ORDER — SODIUM CHLORIDE 0.9 % IV SOLN
INTRAVENOUS | Status: DC
Start: 2021-08-18 — End: 2021-08-19

## 2021-08-18 MED ORDER — SODIUM CHLORIDE 0.9 % IV SOLN: INTRAVENOUS | Status: DC | PRN

## 2021-08-18 NOTE — Assessment & Plan Note (Addendum)
Replace ° °

## 2021-08-18 NOTE — ED Notes (Addendum)
Date and time results received: 08/18/21 5:35 PM ? ?Test: Hbg ?Critical Value: 6.3 ? ?Name of Provider Notified: PA Darci Current ? ?Orders Received? Or Actions Taken?: see orders ?

## 2021-08-18 NOTE — Assessment & Plan Note (Addendum)
BUN 60 ?2/2 GI bleed ?Continue gentle IV hydration ?Now Resolved ? ?

## 2021-08-18 NOTE — H&P (Signed)
History and Physical    Patient: Virginia [name] [id number] DOB: May 25, 1941 DOA: 08/18/2021 DOS: the patient was seen and examined on 08/18/2021 PCP: Sharion Balloon, FNP  Patient coming from: Home  Chief Complaint:  Chief Complaint  Patient presents with   Weakness    HPI: Virginia [name] is a 81 y.o. female with  history of coronary artery disease, hyperlipidemia, hypertension, renal artery stenosis, type 2 diabetes mellitus, and more presents the ED with a chief complaint, of generalized weakness.  Patient reports she had generalized weakness and shortness of breath for about 10 days.  Its been intermittent.  Worse with exertion, better with rest.  She denies chest pain, palpitations, dizziness.  She does report tremors that have been going on for 3 weeks.  Patient reports melena for 2 weeks approximately 4 times per day.  She has never had a GI bleed before.  She is on Eliquis for atrial fibrillation.  Patient has not noticed any other bleeding including no epistaxis, bleeding from her gums, large bruising.  Patient denies near syncope and syncope.  She went to her primary care physician who drew blood and saw that her hemoglobin was 8.4, down from 13.  That was yesterday and when we recheck today at 6.3 in the ED.  Patient was FOBT positive.  GI was consulted from the ED and intends to see patient tomorrow.  Patient has no other complaints at this time.  Patient does not smoke, does not drink, does not use illicit drugs.  She is vaccinated for COVID.  Patient is full code.   Review of Systems: As mentioned in the history of present illness. All other systems reviewed and are negative. Past Medical History:  Diagnosis Date   Coronary artery disease    status post coronary artery bypass grafting x3 in 1999   Hyperlipidemia    Hypertension    Renal artery stenosis (HCC)    S/P CABG (coronary artery bypass graft) 1999   x3   Type 2 diabetes mellitus ([place])    Past Surgical History:   Procedure Laterality Date   CARDIAC CATHETERIZATION  04/17/2007   L main 50-60% ostital stenosis, patent LAD, 90% stenosis at L Cfx, dominant RCA with 60-70% segmental stenosis, patent LIMA-LAD, patent free LIMA-OM, patent VG-PDA (Dr. Adora Fridge)   CARDIAC CATHETERIZATION  03/02/2003   L main with 50% osital stenosis, LAD totally occluded after 2nd diagonal, L Cfx with 90% ostial OM, RCA with 70-80% segmental stenosis, patent LIMA-LAD, patent free RIMA-Cfx marginal, patent VG to PDA (Dr. Adora Fridge)   CARDIAC CATHETERIZATION  05/18/1998   significant ostial Cfx & OM & mid LAD disease - subsequent CABG (Dr. Adora Fridge)   CAROTID DOPPLER  03/2006   right bulb with 0-49% diameter reduction   CORONARY ARTERY BYPASS GRAFT  05/19/1998   LIMA to LAD, free RIMA to OM, VG to distal RCA   NM MYOCAR PERF WALL MOTION  04/2011   lexiscan - fixed paical and basal-mid lateral defect, no reversible ischemia; EF 58%; PVCs noted; low risk   RENAL DOPPLER  09/2012   right renal artery 60-99% diameter reduction, left renal artery 1-59% diameter reduction   TOTAL KNEE ARTHROPLASTY Right    Dr. Luna Glasgow   Social History:  reports that she has never smoked. She has never used smokeless tobacco. She reports that she does not drink alcohol and does not use drugs.  No Known Allergies  Family History  Problem Relation Age of Onset  Stroke Mother    Heart attack Son     Prior to Admission medications   Medication Sig Start Date End Date Taking? Authorizing Provider  amLODipine (NORVASC) 5 MG tablet TAKE ONE (1) TABLET EACH DAY 02/16/21  Yes Hawks, Christy A, FNP  apixaban (ELIQUIS) 5 MG TABS tablet Take 1 tablet (5 mg total) by mouth 2 (two) times daily. 02/16/21  Yes Hawks, Christy A, FNP  Ascorbic Acid (VITAMIN C) 1000 MG tablet Take 1,000 mg by mouth daily.   Yes [provider]  atorvastatin (LIPITOR) 80 MG tablet Take 1 tablet (80 mg total) by mouth daily. 02/16/21  Yes Hawks, Theador Hawthorne, FNP   losartan-hydrochlorothiazide (HYZAAR) 100-25 MG tablet TAKE ONE (1) TABLET BY MOUTH EVERY DAY 07/03/21  Yes Hawks, Christy A, FNP  meloxicam (MOBIC) 7.5 MG tablet Take 7.5 mg by mouth daily. 07/03/21  Yes [provider]  metoprolol succinate (TOPROL-XL) 50 MG 24 hr tablet Take 1.5 tablets (75 mg total) by mouth daily. Take with or immediately following a meal. 02/16/21  Yes Hawks, Christy A, FNP  PARoxetine (PAXIL) 10 MG tablet Take 1 tablet (10 mg total) by mouth every morning. 02/16/21  Yes Hawks, Alyse Low A, FNP  hydrochlorothiazide (HYDRODIURIL) 25 MG tablet TAKE ONE (1) TABLET EACH DAY Patient not taking: Reported on 08/18/2021 05/25/21   Sharion Balloon, FNP    Physical Exam: Vitals:   08/18/21 1845 08/18/21 1919 08/18/21 1936 08/18/21 2158  BP: (!) 103/46 117/69 (!) 118/54 (!) 133/59  Pulse: 73 74 76 80  Resp: (!) 24 17 20 20   Temp:  98.7 F (37.1 C) 98.7 F (37.1 C) 97.8 F (36.6 C)  TempSrc:  Oral Oral Oral  SpO2: 100% 100% 99% 98%  Weight:      Height:       1.  General: Patient lying supine in bed,  no acute distress   2. Psychiatric: Alert and oriented x 3, mood and behavior normal for situation, pleasant and cooperative with exam   3. Neurologic: Speech and language are normal, face is symmetric, moves all 4 extremities voluntarily, at baseline without acute deficits on limited exam   4. HEENMT:  Head is atraumatic, normocephalic, pupils reactive to light, conjunctival pallor, neck is supple, trachea is midline, mucous membranes are moist   5. Respiratory : Lungs are clear to auscultation bilaterally without wheezing, rhonchi, rales, no cyanosis, no increase in work of breathing or accessory muscle use   6. Cardiovascular : Heart rate normal, rhythm is irregularly irregular, no murmurs, rubs or gallops, no peripheral edema, peripheral pulses palpated   7. Gastrointestinal:  Abdomen is soft, nondistended, nontender to palpation bowel sounds active, no masses  or organomegaly palpated   8. Skin:  Skin is warm, dry and intact without rashes, acute lesions, or ulcers on limited exam   9.Musculoskeletal:  No acute deformities or trauma, no asymmetry in tone, no peripheral edema, peripheral pulses palpated, no tenderness to palpation in the extremities   Data Reviewed: In the ED Temp 97.7, heart rate 87-104, blood pressure soft at 91/55, 112/59 admission, O2 sats 87-89% recorded, but while in room sats 92% No leukocytosis White blood cell count 10.5, hemoglobin 6.3 down from 13 Platelets 176 Chemistry panel reveals a hypokalemia 3.2 FOBT positive Negative COVID Chest x-ray shows no change from yesterday EKG shows a heart rate of 81, atrial fibrillation, QTc 502 Transfused 2 units ordered Kcentra to reverse Eliquis PPI drip GI consulted and intends to see patient  in the a.m. Assessment and Plan: * Symptomatic anemia Hgb Drop from 13 (Dec 2022) to 6.3 today.  4x black stools daily for 2 weeks Generalized weakness Exertional dyspnea FOBT + On eliquis - reversed with KCentra GI to see in the AM 2 units PRBCs ordered   Hypokalemia Potassium 3.2 Replace and recheck in the a.m.  Elevated BUN BUN 60 2/2 GI bleed Continue gentle IV hydration Trend in the AM   GI bleed FOBT + GI to see in the AM Continue protonix drip 2 units PRBCs ordered Trend HH q6 hours NPO   Generalized weakness 2/2 anemia See plan above PT eval and treat  GERD (gastroesophageal reflux disease) Continue protonix drip Hx of GERD, elevated BUN, melena = likely to be upper GI bleed 2/2 PUD Continue to monitor       Advance Care Planning:   Code Status: Not on file full  Consults: GI  Family Communication: No family at bedside  Severity of Illness: The appropriate patient status for this patient is INPATIENT. Inpatient status is judged to be reasonable and necessary in order to provide the required intensity of service to ensure the patient's  safety. The patient's presenting symptoms, physical exam findings, and initial radiographic and laboratory data in the context of their chronic comorbidities is felt to place them at high risk for further clinical deterioration. Furthermore, it is not anticipated that the patient will be medically stable for discharge from the hospital within 2 midnights of admission.   * I certify that at the point of admission it is my clinical judgment that the patient will require inpatient hospital care spanning beyond 2 midnights from the point of admission due to high intensity of service, high risk for further deterioration and high frequency of surveillance required.*  Author: Rolla Plate, DO 08/18/2021 10:18 PM  For on call review [email or web address].

## 2021-08-18 NOTE — ED Provider Notes (Signed)
?  Face-to-face evaluation ? ? ?History: She is presenting for evaluation of general weakness and black stool.  Anticoagulated for atrial fibrillation.  She is being followed as an outpatient with decreasing hemoglobin.  She was instructed to come here for evaluation by her PCP. ? ?Physical exam: Elderly alert female who is nontoxic in appearance.  Abdomen soft and nontender.  She is not in respiratory distress.  She is lucid. ? ?MDM: Evaluation for  ?Chief Complaint  ?Patient presents with  ? Weakness  ?  ? ?Dark stool which is heme positive significant lowering of hemoglobin.  Etiology not clear likely upper GI source.  She is anticoagulated and required reversal due to rapidly progressive blood loss anemia.  She is hemodynamically stable.  She does not require advanced airway or intensive care monitoring and/or treatment.  Patient admitted for blood transfusion, and GI consultation. ? ?Medical screening examination/treatment/procedure(s) were conducted as a shared visit with non-physician practitioner(s) and myself.  I personally evaluated the patient during the encounter ? ?  ?Mancel Bale, MD ?08/19/21 1024 ? ?

## 2021-08-18 NOTE — Assessment & Plan Note (Addendum)
FOBT + ?Seen by GI and underwent EGD on 3/4 ?That showed nonbleeding superficial Cameron ulcers and hiatal hernia ?She was also noted to have a cratered duodenal ulcer with associated severe edema and erythema.  No active bleeding was noted ?Patient did admit to using NSAIDs at home ?She will need to continue on PPI twice daily ?Abstain from using any further NSAIDs ?Will likely need repeat endoscopy in 2 months for surveillance ?BUN has normalized, hopefully indicating resolution of bleeding ? ?

## 2021-08-18 NOTE — Assessment & Plan Note (Addendum)
Continue on Protonix

## 2021-08-18 NOTE — ED Triage Notes (Signed)
[  Pt arrived via [facility] w c/o generalized weakness x a weak and a half, anemia, and SOB.  ?

## 2021-08-18 NOTE — ED Provider Notes (Addendum)
[place] Provider Note   CSN: [id number] Arrival date & time: 08/18/21  1521     History  Chief Complaint  Patient presents with   Weakness    Virginia [name] is a 81 y.o. female. Past medical history of coronary artery disease status post CABG, hypertension, diabetes, hyperlipidemia, atrial fibrillation on Eliquis, GERD, and renal artery stenosis.  Patient presents emergency department with complaints of 2 weeks of generalized weakness and worsening shortness of breath with exertion.  She was seen by her primary care provider yesterday afternoon.  She ended up getting a CTA chest to rule out pulmonary embolism at that time, however it was negative.  Labs returned today and was notable for a hemoglobin of 8.4 when her baseline is 13.4.  Patient does endorse having black tarry stools for about 2 weeks now.  She denies any abdominal pain, nausea, vomiting, diarrhea.  She denies any chest pain.    Weakness Associated symptoms: shortness of breath   Associated symptoms: no abdominal pain, no chest pain, no diarrhea, no fever, no nausea and no vomiting       Home Medications Prior to Admission medications   Medication Sig Start Date End Date Taking? Authorizing Provider  amLODipine (NORVASC) 5 MG tablet TAKE ONE (1) TABLET EACH DAY 02/16/21   Hawks, Alyse Low A, FNP  apixaban (ELIQUIS) 5 MG TABS tablet Take 1 tablet (5 mg total) by mouth 2 (two) times daily. 02/16/21   Evelina Dun A, FNP  Ascorbic Acid (VITAMIN C) 1000 MG tablet Take 1,000 mg by mouth daily.    [provider]  atorvastatin (LIPITOR) 80 MG tablet Take 1 tablet (80 mg total) by mouth daily. 02/16/21   Sharion Balloon, FNP  hydrochlorothiazide (HYDRODIURIL) 25 MG tablet TAKE ONE (1) TABLET EACH DAY 05/25/21   Sharion Balloon, FNP  losartan-hydrochlorothiazide (HYZAAR) 100-25 MG tablet TAKE ONE (1) TABLET BY MOUTH EVERY DAY 07/03/21   Evelina Dun A, FNP  metoprolol succinate (TOPROL-XL) 50 MG  24 hr tablet Take 1.5 tablets (75 mg total) by mouth daily. Take with or immediately following a meal. 02/16/21   Hawks, Alyse Low A, FNP  PARoxetine (PAXIL) 10 MG tablet Take 1 tablet (10 mg total) by mouth every morning. 02/16/21   Sharion Balloon, FNP      Allergies    Patient has no known allergies.    Review of Systems   Review of Systems  Constitutional:  Negative for fever.  Respiratory:  Positive for shortness of breath.   Cardiovascular:  Negative for chest pain.  Gastrointestinal:  Positive for blood in stool. Negative for abdominal distention, abdominal pain, constipation, diarrhea, nausea and vomiting.  Neurological:  Positive for weakness.  All other systems reviewed and are negative.  Physical Exam Updated Vital Signs BP 117/69    Pulse 74    Temp 98.7 F (37.1 C) (Oral)    Resp 17    Ht 5\' 5"  (1.651 m)    Wt 71.5 kg    SpO2 100%    BMI 26.24 kg/m  Physical Exam Vitals and nursing note reviewed.  Constitutional:      General: She is not in acute distress.    Appearance: Normal appearance. She is ill-appearing. She is not toxic-appearing or diaphoretic.     Comments: Appears pale  HENT:     Head: Normocephalic and atraumatic.     Nose: No nasal deformity.     Mouth/Throat:     Lips: Pink.  No lesions.     Mouth: Mucous membranes are moist. No injury, lacerations, oral lesions or angioedema.     Pharynx: Oropharynx is clear. Uvula midline. No pharyngeal swelling, oropharyngeal exudate, posterior oropharyngeal erythema or uvula swelling.     Comments: Mucous membranes are pale. Eyes:     General: Gaze aligned appropriately. No scleral icterus.       Right eye: No discharge.        Left eye: No discharge.     Conjunctiva/sclera: Conjunctivae normal.     Right eye: Right conjunctiva is not injected. No exudate or hemorrhage.    Left eye: Left conjunctiva is not injected. No exudate or hemorrhage. Cardiovascular:     Rate and Rhythm: Normal rate and regular rhythm.      Pulses: Normal pulses.          Radial pulses are 2+ on the right side and 2+ on the left side.       Dorsalis pedis pulses are 2+ on the right side and 2+ on the left side.     Heart sounds: Normal heart sounds, S1 normal and S2 normal. Heart sounds not distant. No murmur heard.   No friction rub. No gallop. No S3 or S4 sounds.  Pulmonary:     Effort: Pulmonary effort is normal. No accessory muscle usage or respiratory distress.     Breath sounds: Normal breath sounds. No stridor. No wheezing, rhonchi or rales.  Chest:     Chest wall: No tenderness.  Abdominal:     General: Abdomen is flat. Bowel sounds are normal. There is no distension.     Palpations: Abdomen is soft. There is no mass or pulsatile mass.     Tenderness: There is no abdominal tenderness. There is no right CVA tenderness, left CVA tenderness, guarding or rebound.  Musculoskeletal:     Right lower leg: No edema.     Left lower leg: No edema.  Skin:    General: Skin is warm and dry.     Coloration: Skin is not jaundiced or pale.     Findings: No bruising, erythema, lesion or rash.  Neurological:     General: No focal deficit present.     Mental Status: She is alert and oriented to person, place, and time.     GCS: GCS eye subscore is 4. GCS verbal subscore is 5. GCS motor subscore is 6.  Psychiatric:        Mood and Affect: Mood normal.        Behavior: Behavior normal. Behavior is cooperative.    ED Results / Procedures / Treatments   Labs (all labs ordered are listed, but only abnormal results are displayed) Labs Reviewed  CBC WITH DIFFERENTIAL/PLATELET - Abnormal; Notable for the following components:      Result Value   RBC 1.94 (*)    Hemoglobin 6.3 (*)    HCT 18.1 (*)    All other components within normal limits  COMPREHENSIVE METABOLIC PANEL - Abnormal; Notable for the following components:   Potassium 3.2 (*)    Glucose, Bld 167 (*)    BUN 60 (*)    Total Protein 5.8 (*)    All other components  within normal limits  PROTIME-INR - Abnormal; Notable for the following components:   Prothrombin Time 18.0 (*)    INR 1.5 (*)    All other components within normal limits  POC OCCULT BLOOD, ED - Abnormal; Notable for the following components:  Fecal Occult Bld POSITIVE (*)    All other components within normal limits  RESP PANEL BY RT-PCR (FLU A&B, COVID) ARPGX2  APTT  TYPE AND SCREEN  PREPARE RBC (CROSSMATCH)    EKG EKG Interpretation  Date/Time:  Friday August 18 2021 15:44:58 EST Ventricular Rate:  77 PR Interval:    QRS Duration: 102 QT Interval:  435 QTC Calculation: 493 R Axis:   -9 Text Interpretation: Atrial fibrillation Ventricular premature complex Borderline T wave abnormalities Borderline prolonged QT interval Since last tracing of earlier today No significant change was found Confirmed by Daleen Bo [phone]) on 08/18/2021 4:02:13 PM  Radiology DG Chest 2 View  Result Date: 08/17/2021 CLINICAL DATA:  81 year old female with shortness of breath. EXAM: CHEST - 2 VIEW COMPARISON:  None available, report of chest radiographs 03/15/2013 (no images available). FINDINGS: Prior CABG. Cardiac size at the upper limits of normal. Mildly tortuous thoracic aorta. Other mediastinal contours are within normal limits. Visualized tracheal air column is within normal limits. Upper limits of normal to mildly hyperinflated lung volumes. Both lungs appear clear. No pneumothorax or definite effusion. Thoracic kyphoscoliosis. No acute osseous abnormality identified. Paucity of bowel gas in the upper abdomen. Abdominal Calcified aortic atherosclerosis. IMPRESSION: 1. No acute cardiopulmonary abnormality. 2. Prior CABG. Borderline cardiomegaly. Suspected chronic pulmonary hyperinflation. Aortic Atherosclerosis (ICD10-I70.0). Electronically Signed   By: Genevie Ann M.D.   On: 08/17/2021 10:56   CT Angio Chest Pulmonary Embolism (PE) W or WO Contrast  Result Date: 08/17/2021 CLINICAL DATA:  Shortness  of breath. Low oxygen saturation. Pulmonary embolism suspected. EXAM: CT ANGIOGRAPHY CHEST WITH CONTRAST TECHNIQUE: Multidetector CT imaging of the chest was performed using the standard protocol during bolus administration of intravenous contrast. Multiplanar CT image reconstructions and MIPs were obtained to evaluate the vascular anatomy. RADIATION DOSE REDUCTION: This exam was performed according to the departmental dose-optimization program which includes automated exposure control, adjustment of the mA and/or kV according to patient size and/or use of iterative reconstruction technique. CONTRAST:  8mL OMNIPAQUE IOHEXOL 350 MG/ML SOLN COMPARISON:  Chest radiographs 08/17/2021 FINDINGS: Cardiovascular: Pulmonary arterial opacification is adequate without evidence of emboli within limitations of intermittent motion artifact. There is thoracic aortic atherosclerosis without aneurysm. Coronary atherosclerosis and prior CABG are noted. The heart is enlarged including prominent biatrial enlargement. There is no pericardial effusion. Mediastinum/Nodes: No enlarged axillary, mediastinal, or hilar lymph nodes. Unremarkable thyroid and esophagus. Possible small sliding hiatal hernia. Lungs/Pleura: No pleural effusion or pneumothorax. Scattered mild bilateral lung scarring. No mass. Grossly patent central airways. Upper Abdomen: No acute abnormality. Musculoskeletal: Multiple old left rib fractures. Mild T12 compression fracture which is chronic in appearance. Review of the MIP images confirms the above findings. IMPRESSION: 1. No evidence of pulmonary emboli or other acute abnormality in the chest. 2. Cardiomegaly. 3. Aortic Atherosclerosis (ICD10-I70.0). Electronically Signed   By: Logan Bores M.D.   On: 08/17/2021 13:20   DG Chest Port 1 View  Result Date: 08/18/2021 CLINICAL DATA:  Shortness of breath.  Generalized weakness. EXAM: PORTABLE CHEST 1 VIEW COMPARISON:  Radiograph and chest CTA yesterday. FINDINGS:  Post median sternotomy and CABG. Mild cardiomegaly. Unchanged mediastinal contours. Aortic atherosclerosis and tortuosity. No confluent airspace disease, pulmonary edema, pleural effusion or pneumothorax. Multiple remote left rib fractures. Prior right [facility] joint separation. The bones are under mineralized. IMPRESSION: 1. No acute abnormality or change from radiographs and CT yesterday. 2. Stable mild cardiomegaly. Electronically Signed   By: Keith Rake M.D.   On: 08/18/2021  17:37    Procedures .Critical Care Performed by: Adolphus Birchwood, PA-C Authorized by: Adolphus Birchwood, PA-C   Critical care provider statement:    Critical care time (minutes):  45   Critical care time was exclusive of:  Separately billable procedures and treating other patients   Critical care was necessary to treat or prevent imminent or life-threatening deterioration of the following conditions: acute anemia, eliquis reversal.   Critical care was time spent personally by me on the following activities:  Blood draw for specimens, development of treatment plan with patient or surrogate, discussions with consultants, discussions with primary provider, evaluation of patient's response to treatment, examination of patient, obtaining history from patient or surrogate, review of old charts, re-evaluation of patient's condition, pulse oximetry, ordering and review of radiographic studies, ordering and review of laboratory studies and ordering and performing treatments and interventions Comments:     Acute GI bleed with rapid hgb drop. Eliquis reversal with K centra. 2 units pRBC.   This patient was on telemetry or cardiac monitoring during their time in the ED.    Medications Ordered in ED Medications  prothrombin complex conc human (KCENTRA) IVPB 3,258 Units (has no administration in time range)  pantoprazole (PROTONIX) 80 mg /NS 100 mL IVPB (has no administration in time range)  pantoprozole (PROTONIX) 80 mg /NS 100 mL  infusion (has no administration in time range)  0.9 %  sodium chloride infusion (has no administration in time range)  metoCLOPramide (REGLAN) tablet 10 mg (has no administration in time range)    ED Course/ Medical Decision Making/ A&P Clinical Course as of 08/18/21 1931  Fri Aug 18, 2021  1742 Hemoglobin(!!): 6.3 [GL]  1753 Hemoglobin from 13.4 > 8.4 > 6.3  [GL]  1754 Plan to reverse her Eliquis with Kcentra. Ordered 2 units pRBC. Consulting GI for likely GI bleed [GL]    Clinical Course User Index [GL] Sherre Poot Adora Fridge, PA-C                           Medical Decision Making Amount and/or Complexity of Data Reviewed Labs: ordered. Decision-making details documented in ED Course. Radiology: ordered.  Risk Prescription drug management. Decision regarding hospitalization.    MDM  This is a 81 y.o. female with a pertinent PMH of oronary artery disease status post CABG, hypertension, diabetes, hyperlipidemia, atrial fibrillation on Eliquis, GERD, and renal artery stenosis who presents to the ED with 1 and half weeks of generalized weakness, shortness of breath, and 2 weeks of black tarry stools. The differential of this patient includes but is not limited to Anemia, Cardiac (ischemia, arrhythmia, valvular disease), Dehydration, electrolyte abnormality, hypothyroidism, Hyperthyroidism, Infection, Psych, Medication/Toxins  My Impression, Plan, and ED Course:  Sounds like patient has already had labs done and she is severely anemic from her baseline.  She had a hgb drop from 13.2 to 8.4.  Most recent value was about 3 months ago.  Exam is consistent with blood loss given that she has generalized and mucous membrane pallor.  Plan to do a Hemoccult to verify that this is GI loss. Also will obtain EKG to screen for ischemia and arrhythmia. Basic labs, coags, and type and screen ordered.  I personally ordered, reviewed, and interpreted all laboratory work and imaging and agree with  radiologist interpretation. Results interpreted below:  CBC with hemoglobin dropped to 6.3.  CMP with hypokalemia to 3.2.  BUN elevated to 60.  INR is  1.5.  Fecal occult is positive.  I suspect that hemoglobin drop is due to acute lower GI bleed.  Since patient on Eliquis and has a further worsening drop in hemoglobin, plan to reverse Eliquis with Kcentra.  I consulted gastroenterology and spoke with Dr. Jenetta Downer.  Recommends PPI drip as well as 2 units of packed red blood cells.  Also recommends 10 mg of Reglan.  Patient is stable at this time.  She does not require emergent GI intervention.  She is stable and can be admitted to hospitalist service here.  Order placed to consult for hospitalist admission. [facility] accepts.   Charting Requirements Additional history is obtained from:  Independent historian and Other External Records from outside source obtained and reviewed including: PCP office visit from yesterday as well as resulting labs. Social Determinants of Health:  none Pertinant PMH that complicates patient's illness: CAD, HTN, HLD, DM  Patient Care Problems that were addressed during this visit: - Lower GI bleed: Acute illness with systemic symptoms - Anemia: Acute illness  This patient was maintained on a cardiac monitor/telemetry. I personally viewed and interpreted the cardiac monitor which reveals an underlying rhythm of Atrial fibrillation with normal rate Medications given in ED: Reglan, Protonix infusion, Kcentra Reevaluation of the patient after these medicines showed that the patient stayed the same Critical Care Interventions: Eliquis reversal, transfusing blood Consultations: GI consult with Dr. Cherlynn June Disposition: Admit  I have discussed this patient with my attending physician, Dr. Eulis Foster who has made changes to the plan accordingly.  Portions of this note were generated with Lobby[employer]. Dictation errors may occur despite best attempts at  proofreading.   Final Clinical Impression(s) / ED Diagnoses Final diagnoses:  Gastrointestinal hemorrhage, unspecified gastrointestinal hemorrhage type    Rx / DC Orders ED Discharge Orders     None         Adolphus Birchwood, PA-C 08/18/21 1928    Sheila Oats 08/18/21 1931    Sheila Oats 08/18/21 1950    Daleen Bo, MD 08/19/21 1024

## 2021-08-18 NOTE — Assessment & Plan Note (Addendum)
Hgb Drop from 13 (Dec 2022) to 6.3 on admission ?4x black stools daily for 2 weeks prior to admission ?Generalized weakness ?Exertional dyspnea ?FOBT + ?On eliquis - reversed with KCentra ?Transfused a total of 3 units of PRBC during her hospital stay ?Prior to discharge, her hemoglobin was stable ?She will need repeat CBC in 1 week, which GI will arrange. ? ?

## 2021-08-18 NOTE — Assessment & Plan Note (Addendum)
2/2 anemia ?At the time of discharge, she was able to ambulate ?

## 2021-08-19 ENCOUNTER — Encounter (HOSPITAL_COMMUNITY)
Admission: EM | Disposition: A | Discharge status: Home / Self Care | Payer: Self-pay | Source: Home / Self Care | Attending: Internal Medicine

## 2021-08-19 ENCOUNTER — Inpatient Hospital Stay (HOSPITAL_COMMUNITY): Payer: Medicare Other | Admitting: Anesthesiology

## 2021-08-19 ENCOUNTER — Encounter (HOSPITAL_COMMUNITY): Payer: Self-pay | Admitting: Family Medicine

## 2021-08-19 DIAGNOSIS — K269 Duodenal ulcer, unspecified as acute or chronic, without hemorrhage or perforation: Secondary | ICD-10-CM

## 2021-08-19 DIAGNOSIS — I482 Chronic atrial fibrillation, unspecified: Secondary | ICD-10-CM

## 2021-08-19 DIAGNOSIS — K2970 Gastritis, unspecified, without bleeding: Secondary | ICD-10-CM

## 2021-08-19 DIAGNOSIS — K921 Melena: Secondary | ICD-10-CM

## 2021-08-19 DIAGNOSIS — K449 Diaphragmatic hernia without obstruction or gangrene: Secondary | ICD-10-CM

## 2021-08-19 DIAGNOSIS — K315 Obstruction of duodenum: Secondary | ICD-10-CM

## 2021-08-19 DIAGNOSIS — K259 Gastric ulcer, unspecified as acute or chronic, without hemorrhage or perforation: Secondary | ICD-10-CM

## 2021-08-19 HISTORY — PX: BIOPSY: SHX5522

## 2021-08-19 HISTORY — PX: ESOPHAGOGASTRODUODENOSCOPY (EGD) WITH PROPOFOL: SHX5813

## 2021-08-19 LAB — CBC
HCT: 25.2 % — ABNORMAL LOW (ref 36.0–46.0)
HCT: 26.3 % — ABNORMAL LOW (ref 36.0–46.0)
HCT: 26.5 % — ABNORMAL LOW (ref 36.0–46.0)
Hemoglobin: 8.5 g/dL — ABNORMAL LOW (ref 12.0–15.0)
Hemoglobin: 8.6 g/dL — ABNORMAL LOW (ref 12.0–15.0)
Hemoglobin: 8.7 g/dL — ABNORMAL LOW (ref 12.0–15.0)
MCH: 30.4 pg (ref 26.0–34.0)
MCH: 29.6 pg (ref 26.0–34.0)
MCH: 29.9 pg (ref 26.0–34.0)
MCHC: 33.7 g/dL (ref 30.0–36.0)
MCHC: 32.7 g/dL (ref 30.0–36.0)
MCHC: 32.8 g/dL (ref 30.0–36.0)
MCV: 90 fL (ref 80.0–100.0)
MCV: 90.4 fL (ref 80.0–100.0)
MCV: 91.1 fL (ref 80.0–100.0)
Platelets: 145 10*3/uL — ABNORMAL LOW (ref 150–400)
Platelets: 145 10*3/uL — ABNORMAL LOW (ref 150–400)
Platelets: 160 10*3/uL (ref 150–400)
RBC: 2.8 MIL/uL — ABNORMAL LOW (ref 3.87–5.11)
RBC: 2.91 MIL/uL — ABNORMAL LOW (ref 3.87–5.11)
RBC: 2.91 MIL/uL — ABNORMAL LOW (ref 3.87–5.11)
RDW: 16.2 % — ABNORMAL HIGH (ref 11.5–15.5)
RDW: 16.4 % — ABNORMAL HIGH (ref 11.5–15.5)
RDW: 16.6 % — ABNORMAL HIGH (ref 11.5–15.5)
WBC: 9.9 10*3/uL (ref 4.0–10.5)
WBC: 10.6 10*3/uL — ABNORMAL HIGH (ref 4.0–10.5)
WBC: 10.5 10*3/uL (ref 4.0–10.5)
nRBC: 0 % (ref 0.0–0.2)
nRBC: 0.2 % (ref 0.0–0.2)
nRBC: 0 % (ref 0.0–0.2)

## 2021-08-19 LAB — GLUCOSE, CAPILLARY
Glucose-Capillary: 137 mg/dL — ABNORMAL HIGH (ref 70–99)
Glucose-Capillary: 128 mg/dL — ABNORMAL HIGH (ref 70–99)
Glucose-Capillary: 119 mg/dL — ABNORMAL HIGH (ref 70–99)
Glucose-Capillary: 126 mg/dL — ABNORMAL HIGH (ref 70–99)
Glucose-Capillary: 176 mg/dL — ABNORMAL HIGH (ref 70–99)

## 2021-08-19 LAB — COMPREHENSIVE METABOLIC PANEL
ALT: 13 U/L (ref 0–44)
AST: 18 U/L (ref 15–41)
Albumin: 3.4 g/dL — ABNORMAL LOW (ref 3.5–5.0)
Alkaline Phosphatase: 39 U/L (ref 38–126)
Anion gap: 6 (ref 5–15)
BUN: 47 mg/dL — ABNORMAL HIGH (ref 8–23)
CO2: 29 mmol/L (ref 22–32)
Calcium: 9.2 mg/dL (ref 8.9–10.3)
Chloride: 103 mmol/L (ref 98–111)
Creatinine, Ser: 0.73 mg/dL (ref 0.44–1.00)
GFR, Estimated: >60 mL/min (ref >60)
Glucose, Bld: 140 mg/dL — ABNORMAL HIGH (ref 70–99)
Potassium: 3.7 mmol/L (ref 3.5–5.1)
Sodium: 138 mmol/L (ref 135–145)
Total Bilirubin: 1.3 mg/dL — ABNORMAL HIGH (ref 0.3–1.2)
Total Protein: 5.5 g/dL — ABNORMAL LOW (ref 6.5–8.1)

## 2021-08-19 LAB — SPECIMEN STATUS REPORT

## 2021-08-19 LAB — MAGNESIUM: Magnesium: 1.4 mg/dL — ABNORMAL LOW (ref 1.7–2.4)

## 2021-08-19 LAB — IRON AND TIBC
Iron Saturation: 39 % (ref 15–55)
Iron: 108 ug/dL (ref 27–139)
Total Iron Binding Capacity: 277 ug/dL (ref 250–450)
UIBC: 169 ug/dL (ref 118–369)

## 2021-08-19 LAB — B12 AND FOLATE PANEL
Folate: >20 ng/mL (ref >3.0)
Vitamin B-12: 182 pg/mL — ABNORMAL LOW (ref 232–1245)

## 2021-08-19 SURGERY — ESOPHAGOGASTRODUODENOSCOPY (EGD) WITH PROPOFOL
Anesthesia: General

## 2021-08-19 MED ORDER — ONDANSETRON HCL 4 MG/2ML IJ SOLN: 4.0000 mg | Freq: Four times a day (QID) | INTRAMUSCULAR | Status: DC | PRN

## 2021-08-19 MED ORDER — SODIUM CHLORIDE 0.9 % IV SOLN: INTRAVENOUS | Status: DC

## 2021-08-19 MED ORDER — PAROXETINE HCL 10 MG PO TABS
10.0000 mg | ORAL_TABLET | Freq: Every morning | ORAL | Status: DC
  Administered 2021-08-19 – 2021-08-21 (×3): 10 mg via ORAL
  Filled 2021-08-19 (×3): qty 1

## 2021-08-19 MED ORDER — ACETAMINOPHEN 650 MG RE SUPP: 650.0000 mg | Freq: Four times a day (QID) | RECTAL | Status: DC | PRN

## 2021-08-19 MED ORDER — METOPROLOL SUCCINATE ER 50 MG PO TB24
75.0000 mg | ORAL_TABLET | Freq: Every day | ORAL | Status: DC
  Administered 2021-08-20 – 2021-08-21 (×2): 75 mg via ORAL
  Filled 2021-08-19 (×3): qty 1

## 2021-08-19 MED ORDER — OXYCODONE HCL 5 MG PO TABS: 5.0000 mg | ORAL_TABLET | ORAL | Status: DC | PRN

## 2021-08-19 MED ORDER — LIDOCAINE HCL (PF) 2 % IJ SOLN
INTRAMUSCULAR | Status: AC
  Filled 2021-08-19: qty 5

## 2021-08-19 MED ORDER — ACETAMINOPHEN 325 MG PO TABS: 650.0000 mg | ORAL_TABLET | Freq: Four times a day (QID) | ORAL | Status: DC | PRN

## 2021-08-19 MED ORDER — PROPOFOL 10 MG/ML IV BOLUS
INTRAVENOUS | Status: AC
  Filled 2021-08-19: qty 20

## 2021-08-19 MED ORDER — LACTATED RINGERS IV SOLN: INTRAVENOUS | Status: DC | PRN

## 2021-08-19 MED ORDER — PANTOPRAZOLE SODIUM 40 MG PO TBEC
40.0000 mg | DELAYED_RELEASE_TABLET | Freq: Two times a day (BID) | ORAL | Status: DC
  Administered 2021-08-19 – 2021-08-21 (×5): 40 mg via ORAL
  Filled 2021-08-19 (×6): qty 1

## 2021-08-19 MED ORDER — POTASSIUM CHLORIDE 10 MEQ/100ML IV SOLN
10.0000 meq | Freq: Once | INTRAVENOUS | Status: AC
  Administered 2021-08-19: 10 meq via INTRAVENOUS
  Filled 2021-08-19: qty 100

## 2021-08-19 MED ORDER — MAGNESIUM SULFATE 4 GM/100ML IV SOLN
4.0000 g | Freq: Once | INTRAVENOUS | Status: AC
  Administered 2021-08-19: 4 g via INTRAVENOUS
  Filled 2021-08-19: qty 100

## 2021-08-19 MED ORDER — PHENYLEPHRINE 40 MCG/ML (10ML) SYRINGE FOR IV PUSH (FOR BLOOD PRESSURE SUPPORT)
PREFILLED_SYRINGE | INTRAVENOUS | Status: DC | PRN
  Administered 2021-08-19: 120 ug via INTRAVENOUS
  Administered 2021-08-19: 80 ug via INTRAVENOUS

## 2021-08-19 MED ORDER — LIDOCAINE VISCOUS HCL 2 % MT SOLN
15.0000 mL | Freq: Once | OROMUCOSAL | Status: DC
Start: 2021-08-19 — End: 2021-08-19

## 2021-08-19 MED ORDER — PROPOFOL 10 MG/ML IV BOLUS
INTRAVENOUS | Status: DC | PRN
  Administered 2021-08-19 (×2): 20 mg via INTRAVENOUS
  Administered 2021-08-19: 30 mg via INTRAVENOUS
  Administered 2021-08-19: 20 mg via INTRAVENOUS
  Administered 2021-08-19: 50 mg via INTRAVENOUS
  Administered 2021-08-19 (×2): 20 mg via INTRAVENOUS
  Administered 2021-08-19: 30 mg via INTRAVENOUS
  Administered 2021-08-19 (×2): 20 mg via INTRAVENOUS

## 2021-08-19 MED ORDER — ATORVASTATIN CALCIUM 40 MG PO TABS
80.0000 mg | ORAL_TABLET | Freq: Every day | ORAL | Status: DC
Start: 2021-08-19 — End: 2021-08-21
  Administered 2021-08-19 – 2021-08-21 (×3): 80 mg via ORAL
  Filled 2021-08-19 (×3): qty 2

## 2021-08-19 MED ORDER — LIDOCAINE HCL (CARDIAC) PF 100 MG/5ML IV SOSY
PREFILLED_SYRINGE | INTRAVENOUS | Status: DC | PRN
  Administered 2021-08-19: 60 mg via INTRATRACHEAL

## 2021-08-19 MED ORDER — ONDANSETRON HCL 4 MG PO TABS: 4.0000 mg | ORAL_TABLET | Freq: Four times a day (QID) | ORAL | Status: DC | PRN

## 2021-08-19 MED ORDER — LIDOCAINE VISCOUS HCL 2 % MT SOLN
OROMUCOSAL | Status: DC | PRN
  Administered 2021-08-19: 5 mL via OROMUCOSAL

## 2021-08-19 NOTE — Transfer of Care (Signed)
Immediate Anesthesia Transfer of Care Note ? ?Patient: Virginia [name] ? ?Procedure(s) Performed: ESOPHAGOGASTRODUODENOSCOPY (EGD) WITH PROPOFOL ?BIOPSY ? ?Patient Location: PACU ? ?Anesthesia Type:General ? ?Level of Consciousness: awake, alert , oriented, sedated and drowsy ? ?Airway & Oxygen Therapy: Patient Spontanous Breathing and Patient connected to nasal cannula oxygen ? ?Post-op Assessment: Report given to RN and Post -op Vital signs reviewed and stable ? ?Post vital signs: Reviewed and stable ? ?Last Vitals:  ?Vitals Value Taken Time  ?BP 102/38 08/19/21 1017  ?Temp 98 1018  ?Pulse 78 08/19/21 1018  ?Resp 12 08/19/21 1019  ?SpO2 95 % 08/19/21 1018  ?Vitals shown include unvalidated device data. ? ?Last Pain:  ?Vitals:  ? 08/19/21 0937  ?TempSrc: Oral  ?PainSc: 0-No pain  ?   ? ?  ? ?Complications: No notable events documented. ?

## 2021-08-19 NOTE — Assessment & Plan Note (Addendum)
Heart rate currently stable ?Anticoagulation initially held due to GI bleeding ?Eliquis restarted prior to discharge, hemoglobin remained stable ?

## 2021-08-19 NOTE — Progress Notes (Signed)
After finishing her procedure, patient endorsed taking Aleve in the past. Her endoscopic findings are likely related to NSAIDs ?

## 2021-08-19 NOTE — Plan of Care (Signed)
  Problem: Education: Goal: Knowledge of General Education information will improve Description: Including pain rating scale, medication(s)/side effects and non-pharmacologic comfort measures Outcome: Progressing   Problem: Health Behavior/Discharge Planning: Goal: Ability to manage health-related needs will improve Outcome: Progressing   Problem: Activity: Goal: Risk for activity intolerance will decrease Outcome: Progressing   Problem: Coping: Goal: Level of anxiety will decrease Outcome: Progressing   Problem: Elimination: Goal: Will not experience complications related to bowel motility Outcome: Progressing Goal: Will not experience complications related to urinary retention Outcome: Progressing   Problem: Pain Managment: Goal: General experience of comfort will improve Outcome: Progressing   Problem: Safety: Goal: Ability to remain free from injury will improve Outcome: Progressing   Problem: Skin Integrity: Goal: Risk for impaired skin integrity will decrease Outcome: Progressing   

## 2021-08-19 NOTE — Hospital Course (Addendum)
81 year old female with a history of atrial fibrillation on Eliquis, was having dark stools for the past few weeks.  She was admitted to the hospital with increasing weakness and found to be severely anemic with a hemoglobin of 6.3.  It was noted that her baseline hemoglobin runs around 13.  Patient has been taking NSAIDs at home.  She was seen by gastroenterology and underwent EGD that showed duodenal ulcer without any active bleeding as well as nonbleeding Cameron lesions and hiatal hernia.  Eliquis was reversed on admission.  She was transfused a total of 3 units of PRBC.  She was started on PPI twice daily.  Hemoglobin otherwise remained stable.  She will follow-up with GI as an outpatient will have repeat endoscopy in the next 8 weeks. ?

## 2021-08-19 NOTE — Consult Note (Signed)
Katrinka Blazing, M.D. Gastroenterology & Hepatology                                           Patient Name: Virginia [name] Account #: @FLAACCTNO @   MRN: [id number] Admission Date: 08/18/2021 Date of Evaluation:  08/19/2021 Time of Evaluation: 9:06 AM   Referring Physician: Erick Blinks, MD  Chief Complaint:  melena  HPI:  This is a 81 y.o. female with history of coronary artery disease status post CABG, hyperlipidemia, A-fib, hypertension, renal artery stenosis and type 2 diabetes, who was brought to the hospital after presenting melena and generalized weakness.  Patient is a poor historian and does not remember exactly why she came to the hospital.  States that she was feeling weak and had recurrent episodes of melena for the last 2 weeks.  Denies any hematochezia, abdominal pain, nausea, vomiting, fever, chills, abdominal distention or changes in her weight.  She denies taking any NSAIDs but reports that she has been taking Eliquis chronically (last dose possibly yesterday in the morning).  No other antiplatelet medication.  Per medical chart, the patient went to her PCP and saw her hemoglobin went down to 8.4 from a baseline of 13.  Patient denies having any previous EGD or colonoscopies.  In the ED, she presented tachycardia 104 and borderline blood pressure of 107/59, she was afebrile. Labs were remarkable for hemoglobin of 6.3 with white blood cell count of 10.5, MCV of 93 and platelets of 176.  CMP showed potassium of 3.2 rest of electrolytes were within normal limits as well as liver function test renal function.  INR was 1.5.  Chest x-ray showed mild cardiomegaly.  Patient received Kcentra in the ER and was transfused 2 units of PRBC with adequate response.  She was started on PPI drip.  I asked the ER yesterday to give a dose of Reglan but this was not given.  Past Medical History: SEE CHRONIC ISSSUES: Past Medical History:  Diagnosis Date   Coronary artery disease    status post  coronary artery bypass grafting x3 in 1999   Hyperlipidemia    Hypertension    Renal artery stenosis (HCC)    S/P CABG (coronary artery bypass graft) 1999   x3   Type 2 diabetes mellitus (HCC)    Past Surgical History:  Past Surgical History:  Procedure Laterality Date   CARDIAC CATHETERIZATION  04/17/2007   L main 50-60% ostital stenosis, patent LAD, 90% stenosis at L Cfx, dominant RCA with 60-70% segmental stenosis, patent LIMA-LAD, patent free LIMA-OM, patent VG-PDA (Dr. Erlene Quan)   CARDIAC CATHETERIZATION  03/02/2003   L main with 50% osital stenosis, LAD totally occluded after 2nd diagonal, L Cfx with 90% ostial OM, RCA with 70-80% segmental stenosis, patent LIMA-LAD, patent free RIMA-Cfx marginal, patent VG to PDA (Dr. Erlene Quan)   CARDIAC CATHETERIZATION  05/18/1998   significant ostial Cfx & OM & mid LAD disease - subsequent CABG (Dr. Erlene Quan)   CAROTID DOPPLER  03/2006   right bulb with 0-49% diameter reduction   CORONARY ARTERY BYPASS GRAFT  05/19/1998   LIMA to LAD, free RIMA to OM, VG to distal RCA   NM MYOCAR PERF WALL MOTION  04/2011   lexiscan - fixed paical and basal-mid lateral defect, no reversible ischemia; EF 58%; PVCs noted; low risk   RENAL DOPPLER  09/2012   right  renal artery 60-99% diameter reduction, left renal artery 1-59% diameter reduction   TOTAL KNEE ARTHROPLASTY Right    Dr. Hilda Lias   Family History:  Family History  Problem Relation Age of Onset   Stroke Mother    Heart attack Son    Social History:  Social History   Tobacco Use   Smoking status: Never   Smokeless tobacco: Never  Vaping Use   Vaping Use: Never used  Substance Use Topics   Alcohol use: No   Drug use: No    Home Medications:  Prior to Admission medications   Medication Sig Start Date End Date Taking? Authorizing Provider  amLODipine (NORVASC) 5 MG tablet TAKE ONE (1) TABLET EACH DAY 02/16/21  Yes Hawks, Christy A, FNP  apixaban (ELIQUIS) 5 MG TABS tablet Take 1 tablet (5 mg  total) by mouth 2 (two) times daily. 02/16/21  Yes Hawks, Christy A, FNP  Ascorbic Acid (VITAMIN C) 1000 MG tablet Take 1,000 mg by mouth daily.   Yes [provider]  atorvastatin (LIPITOR) 80 MG tablet Take 1 tablet (80 mg total) by mouth daily. 02/16/21  Yes Hawks, Edilia Bo, FNP  losartan-hydrochlorothiazide (HYZAAR) 100-25 MG tablet TAKE ONE (1) TABLET BY MOUTH EVERY DAY 07/03/21  Yes Hawks, Christy A, FNP  meloxicam (MOBIC) 7.5 MG tablet Take 7.5 mg by mouth daily. 07/03/21  Yes [provider]  metoprolol succinate (TOPROL-XL) 50 MG 24 hr tablet Take 1.5 tablets (75 mg total) by mouth daily. Take with or immediately following a meal. 02/16/21  Yes Hawks, Christy A, FNP  PARoxetine (PAXIL) 10 MG tablet Take 1 tablet (10 mg total) by mouth every morning. 02/16/21  Yes Junie Spencer, FNP    Inpatient Medications:  Current Facility-Administered Medications:    0.9 %  sodium chloride infusion, , Intravenous, PRN, Zierle-Ghosh, Asia B, DO, Stopping previously hung infusion at 08/19/21 0455   0.9 %  sodium chloride infusion, , Intravenous, Continuous, Zierle-Ghosh, Asia B, DO, Last Rate: 75 mL/hr at 08/18/21 2259, New Bag at 08/18/21 2259   acetaminophen (TYLENOL) tablet 650 mg, 650 mg, Oral, Q6H PRN **OR** acetaminophen (TYLENOL) suppository 650 mg, 650 mg, Rectal, Q6H PRN, Zierle-Ghosh, Asia B, DO   atorvastatin (LIPITOR) tablet 80 mg, 80 mg, Oral, Daily, Zierle-Ghosh, Asia B, DO, 80 mg at 08/19/21 0835   metoprolol succinate (TOPROL-XL) 24 hr tablet 75 mg, 75 mg, Oral, Daily, Zierle-Ghosh, Asia B, DO   ondansetron (ZOFRAN) tablet 4 mg, 4 mg, Oral, Q6H PRN **OR** ondansetron (ZOFRAN) injection 4 mg, 4 mg, Intravenous, Q6H PRN, Zierle-Ghosh, Asia B, DO   oxyCODONE (Oxy IR/ROXICODONE) immediate release tablet 5 mg, 5 mg, Oral, Q4H PRN, Zierle-Ghosh, Asia B, DO   pantoprozole (PROTONIX) 80 mg /NS 100 mL infusion, 8 mg/hr, Intravenous, Continuous, Zierle-Ghosh, Asia B, DO, Last Rate: 10  mL/hr at 08/19/21 0626, 8 mg/hr at 08/19/21 3474   PARoxetine (PAXIL) tablet 10 mg, 10 mg, Oral, q morning, Zierle-Ghosh, Asia B, DO, 10 mg at 08/19/21 2595 Allergies: Patient has no known allergies.  Complete Review of Systems: GENERAL: negative for malaise, night sweats HEENT: No changes in hearing or vision, no nose bleeds or other nasal problems. NECK: Negative for lumps, goiter, pain and significant neck swelling RESPIRATORY: Negative for cough, wheezing CARDIOVASCULAR: Negative for chest pain, leg swelling, palpitations, orthopnea GI: SEE HPI MUSCULOSKELETAL: Negative for joint pain or swelling, back pain, and muscle pain. SKIN: Negative for lesions, rash PSYCH: Negative for sleep disturbance, mood disorder and recent psychosocial  stressors. HEMATOLOGY Negative for prolonged bleeding, bruising easily, and swollen nodes. ENDOCRINE: Negative for cold or heat intolerance, polyuria, polydipsia and goiter. NEURO: negative for tremor, gait imbalance, syncope and seizures. The remainder of the review of systems is noncontributory.  Physical Exam: BP 105/64   Pulse 64   Temp 97.6 F (36.4 C) (Oral)   Resp 19   Ht 5\' 5"  (1.651 m)   Wt 70.1 kg   SpO2 99%   BMI 25.72 kg/m  GENERAL: The patient is AO x3, in no acute distress. HEENT: Head is normocephalic and atraumatic. EOMI are intact. Mouth is well hydrated and without lesions. NECK: Supple. No masses LUNGS: Clear to auscultation. No presence of rhonchi/wheezing/rales. Adequate chest expansion HEART: RRR, normal s1 and s2. ABDOMEN: Soft, nontender, no guarding, no peritoneal signs, and nondistended. BS +. No masses. EXTREMITIES: Without any cyanosis, clubbing, rash, lesions or edema. NEUROLOGIC: AOx3, no focal motor deficit. SKIN: no jaundice, no rashes  Laboratory Data CBC:     Component Value Date/Time   WBC 10.6 (H) 08/19/2021 0739   RBC 2.91 (L) 08/19/2021 0739   HGB 8.6 (L) 08/19/2021 0739   HGB 8.4 (LL) 08/17/2021  1122   HCT 26.3 (L) 08/19/2021 0739   HCT 25.0 (L) 08/17/2021 1122   PLT 145 (L) 08/19/2021 0739   PLT 196 08/17/2021 1122   MCV 90.4 08/19/2021 0739   MCV 92 08/17/2021 1122   MCH 29.6 08/19/2021 0739   MCHC 32.7 08/19/2021 0739   RDW 16.4 (H) 08/19/2021 0739   RDW 13.2 08/17/2021 1122   LYMPHSABS 2.3 08/18/2021 1657   LYMPHSABS 1.0 08/17/2021 1122   MONOABS 0.6 08/18/2021 1657   EOSABS 0.0 08/18/2021 1657   EOSABS 0.0 08/17/2021 1122   BASOSABS 0.0 08/18/2021 1657   BASOSABS 0.0 08/17/2021 1122   COAG:  Lab Results  Component Value Date   INR 1.5 (H) 08/18/2021   INR 1.0 10/31/2007    BMP:  BMP Latest Ref Rng & Units 08/19/2021 08/18/2021 08/17/2021  Glucose 70 - 99 mg/dL 284(X) 324(M) -  BUN 8 - 23 mg/dL 01(U) 27(O) -  Creatinine 0.44 - 1.00 mg/dL 5.36 6.44 0.34  BUN/Creat Ratio 12 - 28 - - -  Sodium 135 - 145 mmol/L 138 136 -  Potassium 3.5 - 5.1 mmol/L 3.7 3.2(L) -  Chloride 98 - 111 mmol/L 103 100 -  CO2 22 - 32 mmol/L 29 26 -  Calcium 8.9 - 10.3 mg/dL 9.2 9.6 -    HEPATIC:  Hepatic Function Latest Ref Rng & Units 08/19/2021 08/18/2021 08/17/2021  Total Protein 6.5 - 8.1 g/dL 7.4(Q) 5.9(D) 5.7(L)  Albumin 3.5 - 5.0 g/dL 6.3(O) 3.5 4.2  AST 15 - 41 U/L 18 19 20   ALT 0 - 44 U/L 13 14 12   Alk Phosphatase 38 - 126 U/L 39 43 56  Total Bilirubin 0.3 - 1.2 mg/dL 7.5(I) 0.7 0.9    CARDIAC: No results found for: CKTOTAL, CKMB, CKMBINDEX, TROPONINI   Imaging: I personally reviewed and interpreted the available imaging.  Assessment & Plan: 81 y.o. female with history of coronary artery disease status post CABG, hyperlipidemia, A-fib, hypertension, renal artery stenosis and type 2 diabetes, who was brought to the hospital after presenting melena and generalized weakness.  The patient presented transient mild tachycardia and borderline blood pressure that responded to transfusion of blood products and IV fluids.  She has remained hemodynamically stable since then.  Her current  presentation is concerning for acute upper gastrointestinal bleeding,  etiologies include peptic ulcer disease versus Dieulafoy lesion versus AVM, less likely due to malignancy.  Contributors to her current presentation include chronic intake of Eliquis.  We will need to proceed with an EGD today and continue PPI for now.  May need to discuss outpatient colonoscopy as she had never had 1 in the past although she is above age 6.  - Repeat CBC qday, transfuse if Hb <8 - Pantoprazole ggt - 2 large bore IV lines - Active T/S - Keep NPO - Hold Eliquis for now - Avoid NSAIDs - Will proceed with EGD today  Dolores Frame, MD Gastroenterology and Hepatology [facility]

## 2021-08-19 NOTE — Brief Op Note (Signed)
08/18/2021 - 08/19/2021 ? ?10:20 AM ? ?PATIENT:  Virginia [name]  81 y.o. female ? ?PRE-OPERATIVE DIAGNOSIS:  melena, upper GI bleeding ? ?POST-OPERATIVE DIAGNOSIS:  Hiatel hernia, Cameron Ulcers , Duodentis, Duodenal ulcer , Gastric Biopsies for H. Pylori ? ?PROCEDURE:  Procedure(s): ?ESOPHAGOGASTRODUODENOSCOPY (EGD) WITH PROPOFOL (N/A) ?BIOPSY ? ?SURGEON:  Surgeon(s) and Role: ?   Harvel Quale, MD - Primary ? ?Patient underwent EGD under propofol sedation.  Tolerated the procedure adequately.  Esophagus showed a 2 cm hiatal hernia with three non bleeding superficial Cameron ulcers was found. Localized mild inflammation characterized by erythema was found in the gastric antrum.  Biopsies were taken with a cold forceps for Helicobacter pylori testing.  One non-bleeding cratered duodenal ulcer with a clean ulcer base (Forrest Class III) was found in the first portion of the duodenum.  The lesion was 10 mm in largest dimension. There was severe associated edema and erythema. No active bleeding was found. An acquired benign-appearing, intrinsic severe stenosis was found in the first portion of the duodenum. This was secondary to the severe edema and inflammation, as it was located distal to the ulcer. This could only be traversed with an ultraslim endoscope. The second portion of the duodenum was normal.  Upon careful inspection, no presence of hematin or active bleeding was present. ? ?RECOMMENDATIONS ?- Return patient to hospital ward for ongoing care.  ?- Resume previous diet.  ?- Can switch to pantorpazole 40 mg PO twice a day. ?- Await pathology results.  ?- No ibuprofen, naproxen, or other non-steroidal anti-inflammatory drugs.  ?- Check H. pylori serology. ?- Restart Eliquis tomorrow if Hb is stable. ?- Repeat upper endoscopy in 2 months for surveillance of duodenal stenosis.  ? ?Maylon Peppers, MD ?Gastroenterology and Hepatology ?[facility] ? ?

## 2021-08-19 NOTE — Progress Notes (Signed)
?  Progress Note ? ? ?Patient: Virginia [name] [id number] DOB: [birth year] DOA: 08/18/2021     1 ?DOS: the patient was seen and examined on 08/19/2021 ?  ?Brief hospital course: ?81 year old female with a history of atrial fibrillation on Eliquis, was having dark stools for the past few weeks.  She was admitted to the hospital with increasing weakness and found to be severely anemic with a hemoglobin of 6.3.  It was noted that her baseline hemoglobin runs around 13.  Patient has been taking NSAIDs at home.  She was seen by gastroenterology and underwent EGD that showed duodenal ulcer without any active bleeding as well as nonbleeding Cameron lesions and hiatal hernia.  Eliquis was reversed on admission.  She was transfused 2 units PRBC for anemia.  Continue to follow hemoglobin. ? ?Assessment and Plan: ?* Symptomatic anemia ?Hgb Drop from 13 (Dec 2022) to 6.3 on admission ?4x black stools daily for 2 weeks prior to admission ?Generalized weakness ?Exertional dyspnea ?FOBT + ?On eliquis - reversed with KCentra ?Transfused 2 units of PRBC with improvement of hemoglobin to 8.5 ?Continue to monitor ? ? ?Hypokalemia ?Replaced ?Also has hypomagnesemia 1.4, replaced ? ?Elevated BUN ?BUN 60 ?2/2 GI bleed ?Continue gentle IV hydration ?Currently trending down ? ? ?GI bleed ?FOBT + ?Seen by GI and underwent EGD on 3/4 ?That showed nonbleeding superficial Cameron ulcers and hiatal hernia ?She was also noted to have a cratered duodenal ulcer with associated severe edema and erythema.  No active bleeding was noted ?Patient did admit to using NSAIDs at home ?She will need to continue on PPI twice daily ?Abstain from using any further NSAIDs ?Will likely need repeat endoscopy in 2 months for surveillance ? ? ?Generalized weakness ?2/2 anemia ?See plan above ?PT eval and treat ? ?GERD (gastroesophageal reflux disease) ?Continue on Protonix ? ?Chronic atrial fibrillation (HCC) ?Heart rate currently stable ?Anticoagulation on hold due to  GI bleeding ?Possibly resuming Eliquis tomorrow if hemoglobin is stable ? ? ? ? ? ? ? ?Subjective: She feels tired.  No abdominal pain.  Was able to tolerate soft diet. ? ?Physical Exam: ?Vitals:  ? 08/19/21 1045 08/19/21 1048 08/19/21 1103 08/19/21 1356  ?BP:  140/90 122/87 122/88  ?Pulse: 78  75 87  ?Resp:   16 16  ?Temp:  98 ?F (36.7 ?C) 98.7 ?F (37.1 ?C) 98.4 ?F (36.9 ?C)  ?TempSrc:   Oral   ?SpO2:  95% 100% 100%  ?Weight:      ?Height:      ?General exam: Alert, awake, oriented x 3 ?Respiratory system: Clear to auscultation. Respiratory effort normal. ?Cardiovascular system:RRR. No murmurs, rubs, gallops. ?Gastrointestinal system: Abdomen is nondistended, soft and nontender. No organomegaly or masses felt. Normal bowel sounds heard. ?Central nervous system: Alert and oriented. No focal neurological deficits. ?Extremities: No C/C/E, +pedal pulses ?Skin: No rashes, lesions or ulcers ?Psychiatry: Judgement and insight appear normal. Mood & affect appropriate.  ? ? ? ?Data Reviewed: ? ?Reviewed CBC, chemistry and EGD reports ? ?Family Communication: Discussed with multiple family members at the bedside ? ?Disposition: ?Status is: Inpatient ?Remains inpatient appropriate because: Discharge home once hemoglobin has stabilized on anticoagulation. ? ? ? ? ? ? ? ? ? Planned Discharge Destination: Home ? ? ? ? ?Time spent: 35 minutes ? ?Author: ?Kathie Dike, MD ?08/19/2021 7:12 PM ? ?For on call review [email or web address].  ? ?

## 2021-08-19 NOTE — Anesthesia Preprocedure Evaluation (Addendum)
Anesthesia Evaluation  ?Patient identified by MRN, date of birth, ID band ?Patient awake ? ? ? ?Reviewed: ?Allergy & Precautions, NPO status , Patient's Chart, lab work & pertinent test results, reviewed documented beta blocker date and time  ? ?Airway ?Mallampati: II ? ?TM Distance: >3 FB ?Neck ROM: Full ? ? ? Dental ? ?(+) Dental Advisory Given, Partial Upper, Chipped, Missing ?  ?Pulmonary ?neg pulmonary ROS,  ?  ?Pulmonary exam normal ?breath sounds clear to auscultation ? ? ? ? ? ? Cardiovascular ?hypertension, Pt. on home beta blockers and Pt. on medications ?+ CAD, + CABG and + Peripheral Vascular Disease (renal artery stenosis)  ?+ dysrhythmias Atrial Fibrillation  ?Rhythm:Irregular Rate:Normal ? ?18-Aug-2021 17:31:54 [facility] ROUTINE RECORD ?[birth year] (80 yr) ?Female Caucasian ?Vent. rate 81 BPM ?PR interval * ms ?QRS duration 106 ms ?QT/QTcB 432/502 ms ?P-R-T axes * -4 24 ?Atrial fibrillation ?Probable left ventricular hypertrophy ?Prolonged QT interval ?Since last tracing QT has lengthened ?Otherwise no significant change ?Confirmed by Mancel Bale [phone]) on 08/18/2021 9:25:48 PM ?  ?Neuro/Psych ?PSYCHIATRIC DISORDERS Depression negative neurological ROS ?   ? GI/Hepatic ?Neg liver ROS, GERD  ,  ?Endo/Other  ?diabetes, Well Controlled, Type 2 ? Renal/GU ?negative Renal ROS  ?negative genitourinary ?  ?Musculoskeletal ? ?(+) Arthritis , Osteoarthritis,   ? Abdominal ?  ?Peds ?negative pediatric ROS ?(+)  Hematology ? ?(+) Blood dyscrasia, anemia ,   ?Anesthesia Other Findings ? ? Reproductive/Obstetrics ?negative OB ROS ? ?  ? ? ? ? ? ? ? ? ? ? ? ? ? ?  ?  ? ? ? ? ? ? ? ?Anesthesia Physical ?Anesthesia Plan ? ?ASA: 3 and emergent ? ?Anesthesia Plan: General  ? ?Post-op Pain Management: Minimal or no pain anticipated  ? ?Induction: Intravenous ? ?PONV Risk Score and Plan: TIVA ? ?Airway Management Planned: Nasal Cannula and Natural  Airway ? ?Additional Equipment:  ? ?Intra-op Plan:  ? ?Post-operative Plan:  ? ?Informed Consent: I have reviewed the patients History and Physical, chart, labs and discussed the procedure including the risks, benefits and alternatives for the proposed anesthesia with the patient or authorized representative who has indicated his/her understanding and acceptance.  ? ? ? ?Dental advisory given ? ?Plan Discussed with: Surgeon ? ?Anesthesia Plan Comments:   ? ? ? ? ? ?Anesthesia Quick Evaluation ? ?

## 2021-08-19 NOTE — Anesthesia Postprocedure Evaluation (Signed)
Anesthesia Post Note ? ?Patient: Virginia [name] ? ?Procedure(s) Performed: ESOPHAGOGASTRODUODENOSCOPY (EGD) WITH PROPOFOL ?BIOPSY ? ?Patient location during evaluation: PACU ?Anesthesia Type: General ?Level of consciousness: awake and alert and oriented ?Pain management: pain level controlled ?Vital Signs Assessment: post-procedure vital signs reviewed and stable ?Respiratory status: spontaneous breathing, nonlabored ventilation and respiratory function stable ?Cardiovascular status: blood pressure returned to baseline and stable ?Postop Assessment: no apparent nausea or vomiting ?Anesthetic complications: no ? ? ?No notable events documented. ? ? ?Last Vitals:  ?Vitals:  ? 08/19/21 0937 08/19/21 1017  ?BP: 130/72 (!) 102/38  ?Pulse: 81 78  ?Resp: 20 15  ?Temp: 37.1 ?C 36.7 ?C  ?SpO2: 98% 95%  ?  ?Last Pain:  ?Vitals:  ? 08/19/21 1017  ?TempSrc:   ?PainSc: 0-No pain  ? ? ?  ?  ?  ?  ?  ?  ? ?Virginia [name] ? ? ? ? ?

## 2021-08-19 NOTE — Op Note (Signed)
[facility] ?Patient Name: Virginia [name] ?Procedure Date: 08/19/2021 9:12 AM ?MRN: [id number] ?Date of Birth: 1941-04-10 ?Attending MD: Katrinka Blazing ,  ?CSN: [id number] ?Age: 81 ?Admit Type: Inpatient ?Procedure:                Upper GI endoscopy ?Indications:              Melena ?Providers:                Katrinka Blazing, Dayton Scrape RN, RN, Vonna Kotyk  ?                          Durene Romans, Technician ?Referring MD:              ?Medicines:                Monitored Anesthesia Care ?Complications:            No immediate complications. ?Estimated Blood Loss:     Estimated blood loss: none. ?Procedure:                Pre-Anesthesia Assessment: ?                          - Prior to the procedure, a History and Physical  ?                          was performed, and patient medications, allergies  ?                          and sensitivities were reviewed. The patient's  ?                          tolerance of previous anesthesia was reviewed. ?                          - The risks and benefits of the procedure and the  ?                          sedation options and risks were discussed with the  ?                          patient. All questions were answered and informed  ?                          consent was obtained. ?                          - ASA Grade Assessment: III - A patient with severe  ?                          systemic disease. ?                          - Adequate visualization was aided with the use of  ?                          a transparent cap attached to the distal  part of  ?                          the endoscope. ?                          After obtaining informed consent, the endoscope was  ?                          passed under direct vision. Throughout the  ?                          procedure, the patient's blood pressure, pulse, and  ?                          oxygen saturations were monitored continuously. The  ?                          [id number] scope was  introduced through the  ?                          mouth, and advanced to the duodenal bulb. The upper  ?                          GI endoscopy was accomplished without difficulty.  ?                          The patient tolerated the procedure well. The  ?                          [id number] scope was introduced through  ?                          the mouth, and advanced to the second part of  ?                          duodenum. ?Scope In: 9:48:46 AM ?Scope Out: 10:08:50 AM ?Total Procedure Duration: 0 hours 20 minutes 4 seconds  ?Findings: ?     The exam of the esophagus was otherwise normal. ?     A 2 cm hiatal hernia with three non bleeding superficial Cameron ulcers  ?     was found. ?     Localized mild inflammation characterized by erythema was found in the  ?     gastric antrum. Biopsies were taken with a cold forceps for Helicobacter  ?     pylori testing. ?     One non-bleeding cratered duodenal ulcer with a clean ulcer base  ?     (Forrest Class III) was found in the first portion of the duodenum. The  ?     lesion was 10 mm in largest dimension. There was severe associated edema  ?     and erythema. No active bleeding was found. ?     An acquired benign-appearing, intrinsic severe stenosis was found in the  ?     first portion of the duodenum. This was secondary to the severe edema  ?  and inflammation, as it was located distal to the ulcer. This could only  ?     be traversed with an ultraslim endoscope. ?     The second portion of the duodenum was normal. Upon careful inspection,  ?     no presence of hematin or active bleeding was present. ?Impression:               - 2 cm hiatal hernia with three Cameron ulcers. ?                          - Gastritis. Biopsied. ?                          - Non-bleeding duodenal ulcer with a clean ulcer  ?                          base (Forrest Class III). ?                          - Acquired duodenal stenosis. ?                          - Normal second  portion of the duodenum. ?Moderate Sedation: ?     Per Anesthesia Care ?Recommendation:           - Return patient to hospital ward for ongoing care. ?                          - Resume previous diet. ?                          - Can switch to pantorpazole 40 mg PO twice a day. ?                          - Await pathology results. ?                          - No ibuprofen, naproxen, or other non-steroidal  ?                          anti-inflammatory drugs. ?                          - Check H. pylori serology. ?                          - Restart Eliquis tomorrow if Hb is stable. ?                          - Repeat upper endoscopy in 2 months for  ?                          surveillance of duodenal stenosis. ?Procedure Code(s):        --- Professional --- ?                          [phone], Esophagogastroduodenoscopy, flexible,  ?  transoral; with biopsy, single or multiple ?Diagnosis Code(s):        --- Professional --- ?                          K44.9, Diaphragmatic hernia without obstruction or  ?                          gangrene ?                          K25.9, Gastric ulcer, unspecified as acute or  ?                          chronic, without hemorrhage or perforation ?                          K29.70, Gastritis, unspecified, without bleeding ?                          K26.9, Duodenal ulcer, unspecified as acute or  ?                          chronic, without hemorrhage or perforation ?                          K31.5, Obstruction of duodenum ?                          K92.1, Melena (includes Hematochezia) ?CPT copyright 2019 [employer]. All rights reserved. ?The codes documented in this report are preliminary and upon coder review may  ?be revised to meet current compliance requirements. ?Maylon Peppers, MD ?Maylon Peppers,  ?08/19/2021 10:24:45 AM ?This report has been signed electronically. ?Number of Addenda: 0 ?

## 2021-08-20 LAB — PREPARE RBC (CROSSMATCH)

## 2021-08-20 LAB — CBC
HCT: 22.6 % — ABNORMAL LOW (ref 36.0–46.0)
Hemoglobin: 7.5 g/dL — ABNORMAL LOW (ref 12.0–15.0)
MCH: 30.4 pg (ref 26.0–34.0)
MCHC: 33.2 g/dL (ref 30.0–36.0)
MCV: 91.5 fL (ref 80.0–100.0)
Platelets: 140 10*3/uL — ABNORMAL LOW (ref 150–400)
RBC: 2.47 MIL/uL — ABNORMAL LOW (ref 3.87–5.11)
RDW: 16.3 % — ABNORMAL HIGH (ref 11.5–15.5)
WBC: 5.6 10*3/uL (ref 4.0–10.5)
nRBC: 0 % (ref 0.0–0.2)

## 2021-08-20 LAB — BASIC METABOLIC PANEL
Anion gap: 5 (ref 5–15)
BUN: 23 mg/dL (ref 8–23)
CO2: 28 mmol/L (ref 22–32)
Calcium: 8.4 mg/dL — ABNORMAL LOW (ref 8.9–10.3)
Chloride: 105 mmol/L (ref 98–111)
Creatinine, Ser: 0.65 mg/dL (ref 0.44–1.00)
GFR, Estimated: >60 mL/min (ref >60)
Glucose, Bld: 110 mg/dL — ABNORMAL HIGH (ref 70–99)
Potassium: 3.1 mmol/L — ABNORMAL LOW (ref 3.5–5.1)
Sodium: 138 mmol/L (ref 135–145)

## 2021-08-20 LAB — GLUCOSE, CAPILLARY
Glucose-Capillary: 113 mg/dL — ABNORMAL HIGH (ref 70–99)
Glucose-Capillary: 176 mg/dL — ABNORMAL HIGH (ref 70–99)
Glucose-Capillary: 98 mg/dL (ref 70–99)

## 2021-08-20 MED ORDER — APIXABAN 5 MG PO TABS
5.0000 mg | ORAL_TABLET | Freq: Two times a day (BID) | ORAL | Status: DC
  Administered 2021-08-20 – 2021-08-21 (×2): 5 mg via ORAL
  Filled 2021-08-20 (×2): qty 1

## 2021-08-20 MED ORDER — SODIUM CHLORIDE 0.9% IV SOLUTION: Freq: Once | INTRAVENOUS | Status: AC

## 2021-08-20 MED ORDER — POTASSIUM CHLORIDE CRYS ER 20 MEQ PO TBCR
40.0000 meq | EXTENDED_RELEASE_TABLET | ORAL | Status: AC
  Administered 2021-08-20 (×2): 40 meq via ORAL
  Filled 2021-08-20 (×2): qty 2

## 2021-08-20 NOTE — Plan of Care (Signed)

## 2021-08-20 NOTE — Progress Notes (Signed)
?  Progress Note ? ? ?Patient: Virginia [name] [id number] DOB: 1940/11/08 DOA: 08/18/2021     2 ?DOS: the patient was seen and examined on 08/20/2021 ?  ?Brief hospital course: ?81 year old female with a history of atrial fibrillation on Eliquis, was having dark stools for the past few weeks.  She was admitted to the hospital with increasing weakness and found to be severely anemic with a hemoglobin of 6.3.  It was noted that her baseline hemoglobin runs around 13.  Patient has been taking NSAIDs at home.  She was seen by gastroenterology and underwent EGD that showed duodenal ulcer without any active bleeding as well as nonbleeding Cameron lesions and hiatal hernia.  Eliquis was reversed on admission.  She was transfused 2 units PRBC for anemia.  Continue to follow hemoglobin. ? ?Assessment and Plan: ?* Symptomatic anemia ?Hgb Drop from 13 (Dec 2022) to 6.3 on admission ?4x black stools daily for 2 weeks prior to admission ?Generalized weakness ?Exertional dyspnea ?FOBT + ?On eliquis - reversed with KCentra ?Transfused 2 units of PRBC with improvement of hemoglobin to 8.5 ?Today Hgb down to  7.5, will transfuse another unit of prbc today ? ? ?Hypokalemia ?Replace ? ?Elevated BUN ?BUN 60 ?2/2 GI bleed ?Continue gentle IV hydration ?Now Resolved ? ? ?GI bleed ?FOBT + ?Seen by GI and underwent EGD on 3/4 ?That showed nonbleeding superficial Cameron ulcers and hiatal hernia ?She was also noted to have a cratered duodenal ulcer with associated severe edema and erythema.  No active bleeding was noted ?Patient did admit to using NSAIDs at home ?She will need to continue on PPI twice daily ?Abstain from using any further NSAIDs ?Will likely need repeat endoscopy in 2 months for surveillance ?BUN has normalized, hopefully indicating resolution of bleeding ? ? ?Generalized weakness ?2/2 anemia ?See plan above ?PT eval and treat ? ?GERD (gastroesophageal reflux disease) ?Continue on Protonix ? ?Chronic atrial fibrillation  (HCC) ?Heart rate currently stable ?Anticoagulation on hold due to GI bleeding ?Per GI, restart eliquis this evening ? ? ? ? ? ? ? ?Subjective: She is feeling better today.  Describes having 1 black bowel movement earlier today. ? ?Physical Exam: ?Vitals:  ? 08/20/21 0831 08/20/21 1455 08/20/21 1510 08/20/21 1756  ?BP: 139/73 117/77 (!) 121/51 (!) 108/43  ?Pulse: 91 71 64 71  ?Resp:  15 15 16   ?Temp:  98.8 ?F (37.1 ?C) 98.4 ?F (36.9 ?C) 98 ?F (36.7 ?C)  ?TempSrc:  Oral Oral Oral  ?SpO2:  100% 99% 100%  ?Weight:      ?Height:      ? ?General exam: Alert, awake, oriented x 3 ?Respiratory system: Clear to auscultation. Respiratory effort normal. ?Cardiovascular system:RRR. No murmurs, rubs, gallops. ?Gastrointestinal system: Abdomen is nondistended, soft and nontender. No organomegaly or masses felt. Normal bowel sounds heard. ?Central nervous system: Alert and oriented. No focal neurological deficits. ?Extremities: No C/C/E, +pedal pulses ?Skin: No rashes, lesions or ulcers ?Psychiatry: Judgement and insight appear normal. Mood & affect appropriate.  ? ? ?Data Reviewed: ? ?Reviewed CBC and chemistry ? ?Family Communication: No family present today ? ?Disposition: ?Status is: Inpatient ?Remains inpatient appropriate because: Transfuse PRBC for anemia, replace electrolytes ? ? ? ? ? ? ? ? ? Planned Discharge Destination: Home ? ? ? ? ?Time spent: 35 minutes ? ?Author: ?Kathie Dike, MD ?08/20/2021 7:10 PM ? ?For on call review [email or web address].  ? ?

## 2021-08-20 NOTE — Progress Notes (Signed)
Virginia [name], M.D. ?Gastroenterology & Hepatology ? ? ?Interval History: No acute events overnight. ?Patient reports that she has not presented any more bowel movement since yesterday.  States feeling well, denies any nausea, vomiting, fever, chills, abdominal pain or distention, no melena or hematochezia. ?Labs today showed mild drop in her hemoglobin down to 7.5 but reassuringly BUN has normalized. ? ?Inpatient Medications: ? ?Current Facility-Administered Medications:  ?  0.9 %  sodium chloride infusion, , Intravenous, PRN, Zierle-Ghosh, Asia B, DO, Stopping previously hung infusion at 08/19/21 0455 ?  acetaminophen (TYLENOL) tablet 650 mg, 650 mg, Oral, Q6H PRN **OR** acetaminophen (TYLENOL) suppository 650 mg, 650 mg, Rectal, Q6H PRN, Zierle-Ghosh, Asia B, DO ?  atorvastatin (LIPITOR) tablet 80 mg, 80 mg, Oral, Daily, Zierle-Ghosh, Asia B, DO, 80 mg at 08/20/21 0831 ?  metoprolol succinate (TOPROL-XL) 24 hr tablet 75 mg, 75 mg, Oral, Daily, Zierle-Ghosh, Asia B, DO, 75 mg at 08/20/21 0831 ?  ondansetron (ZOFRAN) tablet 4 mg, 4 mg, Oral, Q6H PRN **OR** ondansetron (ZOFRAN) injection 4 mg, 4 mg, Intravenous, Q6H PRN, Zierle-Ghosh, Asia B, DO ?  oxyCODONE (Oxy IR/ROXICODONE) immediate release tablet 5 mg, 5 mg, Oral, Q4H PRN, Zierle-Ghosh, Asia B, DO ?  pantoprazole (PROTONIX) EC tablet 40 mg, 40 mg, Oral, BID, Montez Morita, Kohler Pellerito, MD, 40 mg at 08/20/21 0831 ?  PARoxetine (PAXIL) tablet 10 mg, 10 mg, Oral, q morning, Zierle-Ghosh, Asia B, DO, 10 mg at 08/20/21 0831  ? ?I/O   ? ?Intake/Output Summary (Last 24 hours) at 08/20/2021 0908 ?Last data filed at 08/20/2021 0100 ?Gross per 24 hour  ?Intake 1090 ml  ?Output --  ?Net 1090 ml  ?  ? ?Physical Exam: ?Temp:  [98 ?F (36.7 ?C)-98.8 ?F (37.1 ?C)] 98 ?F (36.7 ?C) (03/05 4709) ?Pulse Rate:  [75-91] 91 (03/05 0831) ?Resp:  [15-20] 16 (03/05 0508) ?BP: (102-140)/(38-90) 139/73 (03/05 0831) ?SpO2:  [95 %-100 %] 99 % (03/05 0508)  ?Temp (24hrs), Avg:98.3 ?F (36.8  ?C), Min:98 ?F (36.7 ?C), Max:98.8 ?F (37.1 ?C) ?GENERAL: The patient is AO x3, in no acute distress. ?HEENT: Head is normocephalic and atraumatic. EOMI are intact. Mouth is well hydrated and without lesions. ?NECK: Supple. No masses ?LUNGS: Clear to auscultation. No presence of rhonchi/wheezing/rales. Adequate chest expansion ?HEART: RRR, normal s1 and s2. ?ABDOMEN: Soft, nontender, no guarding, no peritoneal signs, and nondistended. BS +. No masses. ?EXTREMITIES: Without any cyanosis, clubbing, rash, lesions or edema. ?NEUROLOGIC: AOx3, no focal motor deficit. ?SKIN: no jaundice, no rashes ? ?Laboratory Data: ?CBC:  ?   ?Component Value Date/Time  ? WBC 5.6 08/20/2021 0413  ? RBC 2.47 (L) 08/20/2021 0413  ? HGB 7.5 (L) 08/20/2021 0413  ? HGB 8.4 (LL) 08/17/2021 1122  ? HCT 22.6 (L) 08/20/2021 0413  ? HCT 25.0 (L) 08/17/2021 1122  ? PLT 140 (L) 08/20/2021 0413  ? PLT 196 08/17/2021 1122  ? MCV 91.5 08/20/2021 0413  ? MCV 92 08/17/2021 1122  ? MCH 30.4 08/20/2021 0413  ? MCHC 33.2 08/20/2021 0413  ? RDW 16.3 (H) 08/20/2021 0413  ? RDW 13.2 08/17/2021 1122  ? LYMPHSABS 2.3 08/18/2021 1657  ? LYMPHSABS 1.0 08/17/2021 1122  ? MONOABS 0.6 08/18/2021 1657  ? EOSABS 0.0 08/18/2021 1657  ? EOSABS 0.0 08/17/2021 1122  ? BASOSABS 0.0 08/18/2021 1657  ? BASOSABS 0.0 08/17/2021 1122  ? ?COAG:  ?Lab Results  ?Component Value Date  ? INR 1.5 (H) 08/18/2021  ? INR 1.0 10/31/2007  ?  ?BMP:  ?BMP  Latest Ref Rng & Units 08/20/2021 08/19/2021 08/18/2021  ?Glucose 70 - 99 mg/dL 110(H) 140(H) 167(H)  ?BUN 8 - 23 mg/dL 23 47(H) 60(H)  ?Creatinine 0.44 - 1.00 mg/dL 0.65 0.73 0.76  ?BUN/Creat Ratio 12 - 28 - - -  ?Sodium 135 - 145 mmol/L 138 138 136  ?Potassium 3.5 - 5.1 mmol/L 3.1(L) 3.7 3.2(L)  ?Chloride 98 - 111 mmol/L 105 103 100  ?CO2 22 - 32 mmol/L '28 29 26  ' ?Calcium 8.9 - 10.3 mg/dL 8.4(L) 9.2 9.6  ?  ?HEPATIC:  ?Hepatic Function Latest Ref Rng & Units 08/19/2021 08/18/2021 08/17/2021  ?Total Protein 6.5 - 8.1 g/dL 5.5(L) 5.8(L) 5.7(L)   ?Albumin 3.5 - 5.0 g/dL 3.4(L) 3.5 4.2  ?AST 15 - 41 U/L '18 19 20  ' ?ALT 0 - 44 U/L '13 14 12  ' ?Alk Phosphatase 38 - 126 U/L 39 43 56  ?Total Bilirubin 0.3 - 1.2 mg/dL 1.3(H) 0.7 0.9  ?  ?CARDIAC: No results found for: CKTOTAL, CKMB, CKMBINDEX, TROPONINI  ? ? ?Imaging: I personally reviewed and interpreted the available labs, imaging and endoscopic files. ?  ?Assessment/Plan: ?81 y.o. female with history of coronary artery disease status post CABG, hyperlipidemia, A-fib, hypertension, renal artery stenosis and type 2 diabetes, who was brought to the hospital after presenting melena and generalized weakness.  The patient presented transient mild tachycardia and borderline blood pressure that responded to transfusion of blood products and IV fluids.   Patient underwent EGD yesterday which showed presence of a 2 cm hiatal hernia and superficial Cameron erosions that were not bleeding.  There was mild erythema in the antrum.  Biopsies were obtained to rule out H. pylori.  There was significant edema and nonbleeding cratered duodenal ulcer in the duodenal sweep with presence of an associated stenosis that could be transversed only with an ultraslim scope. ? ?Patient has remained hemodynamically stable and has not presented any more ongoing clinical bleeding although her hemoglobin dropped down slightly today.  However her BUN has normalized which is reassuring.  We will continue advancing her diet and can consider restarting her Eliquis tonight.  Etiology of her ulcer is likely related to chronic Aleve use.  She should avoid these medications in the future.  We will need to repeat an EGD in 2 months to assess luminal patency given stenosis. ? ?- Repeat CBC qday, transfuse if Hb <8 ?- Pantoprazole 40 mg p.o. twice a day for at least 3 months ?-Follow-up pathology reports ?- 2 large bore IV lines ?- Active T/S ?-Advance diet as tolerated ?-Can restart Eliquis today at night ?- Avoid NSAIDs, discussion was held with the  patient regarding only using medication such as Tylenol or opiates for pain control ?- Will need repeat EGD in 2 months ? ?Virginia Peppers, MD ?Gastroenterology and Hepatology ?[facility] ? ?

## 2021-08-21 ENCOUNTER — Encounter (INDEPENDENT_AMBULATORY_CARE_PROVIDER_SITE_OTHER): Payer: Self-pay | Admitting: *Deleted

## 2021-08-21 ENCOUNTER — Telehealth: Payer: Self-pay | Admitting: Gastroenterology

## 2021-08-21 DIAGNOSIS — I809 Phlebitis and thrombophlebitis of unspecified site: Secondary | ICD-10-CM

## 2021-08-21 LAB — TYPE AND SCREEN
ABO/RH(D): B POS
Antibody Screen: NEGATIVE
Unit division: 0
Unit division: 0
Unit division: 0

## 2021-08-21 LAB — CBC
HCT: 28.4 % — ABNORMAL LOW (ref 36.0–46.0)
HCT: 29.4 % — ABNORMAL LOW (ref 36.0–46.0)
Hemoglobin: 8.8 g/dL — ABNORMAL LOW (ref 12.0–15.0)
Hemoglobin: 9.3 g/dL — ABNORMAL LOW (ref 12.0–15.0)
MCH: 28.2 pg (ref 26.0–34.0)
MCH: 29.7 pg (ref 26.0–34.0)
MCHC: 31 g/dL (ref 30.0–36.0)
MCHC: 31.6 g/dL (ref 30.0–36.0)
MCV: 91 fL (ref 80.0–100.0)
MCV: 93.9 fL (ref 80.0–100.0)
Platelets: 168 10*3/uL (ref 150–400)
Platelets: 173 10*3/uL (ref 150–400)
RBC: 3.12 MIL/uL — ABNORMAL LOW (ref 3.87–5.11)
RBC: 3.13 MIL/uL — ABNORMAL LOW (ref 3.87–5.11)
RDW: 17.2 % — ABNORMAL HIGH (ref 11.5–15.5)
RDW: 17.2 % — ABNORMAL HIGH (ref 11.5–15.5)
WBC: 6.6 10*3/uL (ref 4.0–10.5)
WBC: 7.9 10*3/uL (ref 4.0–10.5)
nRBC: 0 % (ref 0.0–0.2)
nRBC: 0 % (ref 0.0–0.2)

## 2021-08-21 LAB — H. PYLORI ANTIBODY, IGG: H Pylori IgG: 0.23 Index Value (ref 0.00–0.79)

## 2021-08-21 LAB — BASIC METABOLIC PANEL
Anion gap: 5 (ref 5–15)
BUN: 16 mg/dL (ref 8–23)
CO2: 26 mmol/L (ref 22–32)
Calcium: 8.4 mg/dL — ABNORMAL LOW (ref 8.9–10.3)
Chloride: 106 mmol/L (ref 98–111)
Creatinine, Ser: 0.58 mg/dL (ref 0.44–1.00)
GFR, Estimated: >60 mL/min (ref >60)
Glucose, Bld: 117 mg/dL — ABNORMAL HIGH (ref 70–99)
Potassium: 4.2 mmol/L (ref 3.5–5.1)
Sodium: 137 mmol/L (ref 135–145)

## 2021-08-21 LAB — GLUCOSE, CAPILLARY
Glucose-Capillary: 112 mg/dL — ABNORMAL HIGH (ref 70–99)
Glucose-Capillary: 132 mg/dL — ABNORMAL HIGH (ref 70–99)
Glucose-Capillary: 179 mg/dL — ABNORMAL HIGH (ref 70–99)
Glucose-Capillary: 89 mg/dL (ref 70–99)

## 2021-08-21 LAB — BPAM RBC
Blood Product Expiration Date: 202304032359
Blood Product Expiration Date: 202304052359
Blood Product Expiration Date: 202304092359
ISSUE DATE / TIME: 202303031902
ISSUE DATE / TIME: 202303032216
ISSUE DATE / TIME: 202303051449
Unit Type and Rh: 1700
Unit Type and Rh: 1700
Unit Type and Rh: 5100

## 2021-08-21 LAB — MAGNESIUM: Magnesium: 1.9 mg/dL (ref 1.7–2.4)

## 2021-08-21 MED ORDER — DOXYCYCLINE HYCLATE 100 MG PO CAPS
100.0000 mg | ORAL_CAPSULE | Freq: Two times a day (BID) | ORAL | 0 refills | Status: AC
Start: 2021-08-21 — End: 2021-08-26

## 2021-08-21 MED ORDER — PANTOPRAZOLE SODIUM 40 MG PO TBEC
40.0000 mg | DELAYED_RELEASE_TABLET | Freq: Two times a day (BID) | ORAL | 1 refills | Status: DC
Start: 2021-08-21 — End: 2021-10-17

## 2021-08-21 NOTE — Telephone Encounter (Signed)
Virginia [name], patient needs hospital follow-up for GI bleed. Patient of Dr. Levon Hedger. Thanks! Recommend CBC later this week if patient willing. Thanks! ?

## 2021-08-21 NOTE — Progress Notes (Signed)
? ?Gastroenterology Progress Note  ? ?Referring Provider: No ref. provider found ?Primary Care Physician:  Sharion Balloon, FNP ?Primary Gastroenterologist:  Dr. Jenetta Downer ? ?Patient ID: Virginia [name]; [id number]; 27-Jul-1940  ? ? ?Subjective  ? ?Denies any abdominal pain, hematemesis, hematochezia. Did report a small soft dark stool this morning but no bright red blood. States she has had a good appetite. Denies N/V. She is feeling well and would like to go home today if possible.  ? ?Objective  ? ?Vital signs in last 24 hours ?Temp:  [97.5 ?F (36.4 ?C)-98.4 ?F (36.9 ?C)] 98.4 ?F (36.9 ?C) (03/06 1439) ?Pulse Rate:  [60-80] 80 (03/06 1439) ?Resp:  [16-20] 20 (03/06 1439) ?BP: (106-143)/(43-71) 139/68 (03/06 1439) ?SpO2:  [97 %-100 %] 97 % (03/06 1439) ?Last BM Date : 08/18/21 ? ?Physical Exam ?General:   Alert and oriented, pleasant ?Head:  Normocephalic and atraumatic. ?Eyes:  No icterus, sclera clear. Conjuctiva pink.  ?Mouth:  Without lesions, mucosa pink and moist.  ?Neck:  Supple, without thyromegaly or masses.  ?Heart:  S1, S2 present, no murmurs noted.  ?Lungs: Clear to auscultation bilaterally, without wheezing, rales, or rhonchi.  ?Abdomen:  Bowel sounds present, soft, non-tender, non-distended. No HSM or hernias noted. No rebound or guarding. No masses appreciated  ?Msk:  Symmetrical without gross deformities. Normal posture. ?Pulses:  Normal pulses noted. ?Extremities:  Without clubbing or edema. ?Neurologic:  Alert and  oriented x4;  grossly normal neurologically. ?Skin:  Warm and dry, intact without significant lesions.  ?Psych:  Alert and cooperative. Normal mood and affect. ? ?Intake/Output from previous day: ?03/05 0701 - 03/06 0700 ?In: 842 [P.O.:480; Blood:362] ?Out: -  ?Intake/Output this shift: ?No intake/output data recorded. ? ?Lab Results ? ?Recent Labs  ?  08/20/21 ?0413 08/21/21 ?0452 08/21/21 ?1354  ?WBC 5.6 6.6 7.9  ?HGB 7.5* 8.8* 9.3*  ?HCT 22.6* 28.4* 29.4*  ?PLT 140* 168 173   ? ?BMET ?Recent Labs  ?  08/19/21 ?0154 08/20/21 ?0413 08/21/21 ?0452  ?NA 138 138 137  ?K 3.7 3.1* 4.2  ?CL 103 105 106  ?CO2 29 28 26   ?GLUCOSE 140* 110* 117*  ?BUN 47* 23 16  ?CREATININE 0.73 0.65 0.58  ?CALCIUM 9.2 8.4* 8.4*  ? ?LFT ?Recent Labs  ?  08/18/21 ?1657 08/19/21 ?0154  ?PROT 5.8* 5.5*  ?ALBUMIN 3.5 3.4*  ?AST 19 18  ?ALT 14 13  ?ALKPHOS 43 39  ?BILITOT 0.7 1.3*  ? ?PT/INR ?Recent Labs  ?  08/18/21 ?1657  ?LABPROT 18.0*  ?INR 1.5*  ? ?Hepatitis Panel ?No results for input(s): HEPBSAG, HCVAB, HEPAIGM, HEPBIGM in the last 72 hours. ? ? ?Studies/Results ?DG Chest 2 View ? ?Result Date: 08/17/2021 ?CLINICAL DATA:  81 year old female with shortness of breath. EXAM: CHEST - 2 VIEW COMPARISON:  None available, report of chest radiographs 03/15/2013 (no images available). FINDINGS: Prior CABG. Cardiac size at the upper limits of normal. Mildly tortuous thoracic aorta. Other mediastinal contours are within normal limits. Visualized tracheal air column is within normal limits. Upper limits of normal to mildly hyperinflated lung volumes. Both lungs appear clear. No pneumothorax or definite effusion. Thoracic kyphoscoliosis. No acute osseous abnormality identified. Paucity of bowel gas in the upper abdomen. Abdominal Calcified aortic atherosclerosis. IMPRESSION: 1. No acute cardiopulmonary abnormality. 2. Prior CABG. Borderline cardiomegaly. Suspected chronic pulmonary hyperinflation. Aortic Atherosclerosis (ICD10-I70.0). Electronically Signed   By: Genevie Ann M.D.   On: 08/17/2021 10:56  ? ?CT Angio Chest Pulmonary Embolism (PE) W or WO  Contrast ? ?Result Date: 08/17/2021 ?CLINICAL DATA:  Shortness of breath. Low oxygen saturation. Pulmonary embolism suspected. EXAM: CT ANGIOGRAPHY CHEST WITH CONTRAST TECHNIQUE: Multidetector CT imaging of the chest was performed using the standard protocol during bolus administration of intravenous contrast. Multiplanar CT image reconstructions and MIPs were obtained to evaluate the  vascular anatomy. RADIATION DOSE REDUCTION: This exam was performed according to the departmental dose-optimization program which includes automated exposure control, adjustment of the mA and/or kV according to patient size and/or use of iterative reconstruction technique. CONTRAST:  70mL OMNIPAQUE IOHEXOL 350 MG/ML SOLN COMPARISON:  Chest radiographs 08/17/2021 FINDINGS: Cardiovascular: Pulmonary arterial opacification is adequate without evidence of emboli within limitations of intermittent motion artifact. There is thoracic aortic atherosclerosis without aneurysm. Coronary atherosclerosis and prior CABG are noted. The heart is enlarged including prominent biatrial enlargement. There is no pericardial effusion. Mediastinum/Nodes: No enlarged axillary, mediastinal, or hilar lymph nodes. Unremarkable thyroid and esophagus. Possible small sliding hiatal hernia. Lungs/Pleura: No pleural effusion or pneumothorax. Scattered mild bilateral lung scarring. No mass. Grossly patent central airways. Upper Abdomen: No acute abnormality. Musculoskeletal: Multiple old left rib fractures. Mild T12 compression fracture which is chronic in appearance. Review of the MIP images confirms the above findings. IMPRESSION: 1. No evidence of pulmonary emboli or other acute abnormality in the chest. 2. Cardiomegaly. 3. Aortic Atherosclerosis (ICD10-I70.0). Electronically Signed   By: Sebastian Ache M.D.   On: 08/17/2021 13:20  ? ?DG Chest Port 1 View ? ?Result Date: 08/18/2021 ?CLINICAL DATA:  Shortness of breath.  Generalized weakness. EXAM: PORTABLE CHEST 1 VIEW COMPARISON:  Radiograph and chest CTA yesterday. FINDINGS: Post median sternotomy and CABG. Mild cardiomegaly. Unchanged mediastinal contours. Aortic atherosclerosis and tortuosity. No confluent airspace disease, pulmonary edema, pleural effusion or pneumothorax. Multiple remote left rib fractures. Prior right [facility] joint separation. The bones are under mineralized. IMPRESSION: 1. No  acute abnormality or change from radiographs and CT yesterday. 2. Stable mild cardiomegaly. Electronically Signed   By: Narda Rutherford M.D.   On: 08/18/2021 17:37   ? ?Assessment  ?81 y.o. female with a history of CAD s/p CABG, HLD, A-fib on eliquis, HTN, renal artery stenosis, and DM2. She presented to the ED with melena and generalized weakness.  In the ED she was found to be tachycardic with borderline low blood pressure.  Her hemoglobin on arrival was 6.3, down from 8.2 the day prior from labs drawn by her PCP.  She was given 2 units of blood and IV fluids in the ED, which improved her hemodynamically.  GI was consulted for melena and anemia.  ? ?Melena/Duodenal ulcer: Had melena for 2 weeks and ongoing weakness. Hgb 6.3 on admission. She underwent EGD on 3/4 with 2 cm hiatal hernia with 3 Cameron ulcers, mild inflammation in gastric antrum, 1 non bleeding cratered duodenal ulcer  with clean base (10 mm) with severe edema and no bleeding, benign appearing intrinsic severe stenosis in the first portion of the duodenum. Follow up H.Pylori path. Will need repeat EGD in 2 months. Continue pantoprazole 40 mg BID for at least 3 months and avoid all NSAIDs. ? ?Anemia: Found to have Hgb 8.2 on labs ordered by PCP on 08/17/21. Baseline appears to be around 13. Upon arrival to hospital blood pressures borderline and Hgb 6.3 - was given 2 units PRBC and Kcentra and started on PPI drip. Is on Eliquis due to Afib. Hgb improved to 8.7 then had another drop to 7.5 yesterday and received another unit of blood. Hgb  continues to improve to 8.8 and 9.5 today after restarting eliquis yesterday. Will recheck CBC in 1 week and have follow up outpatient.  ? ?Plan / Recommendations  ?Return to ED for progressive weakness, continued melena, or further signs of bleeding. ?Continue pantoprazole 40 mg BID for at least 3 months. ?Avoid all NSAIDs and NSAID containing products.  ?Repeat CBC in 1 week.  ?Follow up to GI clinic, f/u H. Pylori  path. ?Repeat EGD in 2 months. ? ? ? LOS: 3 days  ? ? 08/21/2021, 4:21 PM ? ? ?Venetia Night, MSN, FNP-BC, AGACNP-BC ?[facility] ? ? ?

## 2021-08-21 NOTE — Plan of Care (Signed)

## 2021-08-21 NOTE — Progress Notes (Signed)
Discharged instructions given. Patient verbalized understanding. Discharged via wheelchair by private vehicle.  ?

## 2021-08-21 NOTE — Telephone Encounter (Signed)
Forwarded to [employer] for labs ?Patient is sch'd 09/11/21 at 330, apt letter mailed to patient ?

## 2021-08-21 NOTE — Progress Notes (Signed)
?  Transition of Care (TOC) Screening Note ? ? ?Patient Details  ?Name: Virginia [name] ?Date of Birth: 1941-02-02 ? ? ?Transition of Care (TOC) CM/SW Contact:    ?Villa Herb, LCSWA ?Phone Number: ?08/21/2021, 2:11 PM ? ? ? ?Transition of Care Department [facility]) has reviewed patient and no TOC needs have been identified at this time. We will continue to monitor patient advancement through interdisciplinary progression rounds. If new patient transition needs arise, please place a TOC consult. ?  ?

## 2021-08-21 NOTE — Discharge Summary (Signed)
Physician Discharge Summary   Patient: Virginia [name] MRN: [id number] DOB: 01/26/1941  Admit date:     08/18/2021  Discharge date: 08/21/21  Discharge Physician: Kathie Dike   PCP: Sharion Balloon, FNP   Recommendations at discharge:   She will be scheduled to follow-up with GI next 8 weeks for repeat endoscopy  Discharge Diagnoses: Principal Problem:   Symptomatic anemia Active Problems:   Chronic atrial fibrillation (HCC)   GERD (gastroesophageal reflux disease)   Generalized weakness   GI bleed   Elevated BUN   Hypokalemia   Thrombophlebitis  Resolved Problems:   * No resolved hospital problems. *  Hospital Course: 81 year old female with a history of atrial fibrillation on Eliquis, was having dark stools for the past few weeks.  She was admitted to the hospital with increasing weakness and found to be severely anemic with a hemoglobin of 6.3.  It was noted that her baseline hemoglobin runs around 13.  Patient has been taking NSAIDs at home.  She was seen by gastroenterology and underwent EGD that showed duodenal ulcer without any active bleeding as well as nonbleeding Cameron lesions and hiatal hernia.  Eliquis was reversed on admission.  She was transfused a total of 3 units of PRBC.  She was started on PPI twice daily.  Hemoglobin otherwise remained stable.  She will follow-up with GI as an outpatient will have repeat endoscopy in the next 8 weeks.  Assessment and Plan: * Symptomatic anemia Hgb Drop from 13 (Dec 2022) to 6.3 on admission 4x black stools daily for 2 weeks prior to admission Generalized weakness Exertional dyspnea FOBT + On eliquis - reversed with KCentra Transfused a total of 3 units of PRBC during her hospital stay Prior to discharge, her hemoglobin was stable She will need repeat CBC in 1 week, which GI will arrange.   Thrombophlebitis Noted to have mild erythema in antecubital fossa of left arm where IV was present.  Started on a short course  of doxycycline and advised to use warm compresses over this area.  Hypokalemia Replace  Elevated BUN BUN 60 2/2 GI bleed Continue gentle IV hydration Now Resolved   GI bleed FOBT + Seen by GI and underwent EGD on 3/4 That showed nonbleeding superficial Cameron ulcers and hiatal hernia She was also noted to have a cratered duodenal ulcer with associated severe edema and erythema.  No active bleeding was noted Patient did admit to using NSAIDs at home She will need to continue on PPI twice daily Abstain from using any further NSAIDs Will likely need repeat endoscopy in 2 months for surveillance BUN has normalized, hopefully indicating resolution of bleeding   Generalized weakness 2/2 anemia At the time of discharge, she was able to ambulate  GERD (gastroesophageal reflux disease) Continue on Protonix  Chronic atrial fibrillation (HCC) Heart rate currently stable Anticoagulation initially held due to GI bleeding Eliquis restarted prior to discharge, hemoglobin remained stable         Consultants: Gastroenterology Procedures performed: EGD Disposition: Home Diet recommendation:  Discharge Diet Orders (From admission, onward)     Start     Ordered   08/21/21 0000  Diet - low sodium heart healthy        08/21/21 1533           Cardiac diet DISCHARGE MEDICATION: Allergies as of 08/21/2021   No Known Allergies      Medication List     STOP taking these medications    amLODipine 5  MG tablet Commonly known as: NORVASC   losartan-hydrochlorothiazide 100-25 MG tablet Commonly known as: HYZAAR   meloxicam 7.5 MG tablet Commonly known as: MOBIC       TAKE these medications    apixaban 5 MG Tabs tablet Commonly known as: Eliquis Take 1 tablet (5 mg total) by mouth 2 (two) times daily.   atorvastatin 80 MG tablet Commonly known as: LIPITOR Take 1 tablet (80 mg total) by mouth daily.   doxycycline 100 MG capsule Commonly known as:  VIBRAMYCIN Take 1 capsule (100 mg total) by mouth 2 (two) times daily for 5 days.   metoprolol succinate 50 MG 24 hr tablet Commonly known as: TOPROL-XL Take 1.5 tablets (75 mg total) by mouth daily. Take with or immediately following a meal.   pantoprazole 40 MG tablet Commonly known as: PROTONIX Take 1 tablet (40 mg total) by mouth 2 (two) times daily.   PARoxetine 10 MG tablet Commonly known as: PAXIL Take 1 tablet (10 mg total) by mouth every morning.   vitamin C 1000 MG tablet Take 1,000 mg by mouth daily.        Follow-up Information     Harvel Quale, MD Follow up.   Specialty: Gastroenterology Why: office will contact you with appointment Contact information: [id number] [place] [address] [phone]         Sharion Balloon, FNP. Schedule an appointment as soon as possible for a visit in 2 week(s).   Specialty: Family Medicine Contact information: [place] [address] [phone]                Discharge Exam: Danley Danker Weights   08/18/21 1544 08/19/21 0002  Weight: 71.5 kg 70.1 kg   General exam: Alert, awake, oriented x 3 Respiratory system: Clear to auscultation. Respiratory effort normal. Cardiovascular system:RRR. No murmurs, rubs, gallops. Gastrointestinal system: Abdomen is nondistended, soft and nontender. No organomegaly or masses felt. Normal bowel sounds heard. Central nervous system: Alert and oriented. No focal neurological deficits. Extremities: No C/C/E, +pedal pulses Skin: No rashes, lesions or ulcers Psychiatry: Judgement and insight appear normal. Mood & affect appropriate.    Condition at discharge: good  The results of significant diagnostics from this hospitalization (including imaging, microbiology, ancillary and laboratory) are listed below for reference.   Imaging Studies: DG Chest 2 View  Result Date: 08/17/2021 CLINICAL DATA:  81 year old female with shortness  of breath. EXAM: CHEST - 2 VIEW COMPARISON:  None available, report of chest radiographs 03/15/2013 (no images available). FINDINGS: Prior CABG. Cardiac size at the upper limits of normal. Mildly tortuous thoracic aorta. Other mediastinal contours are within normal limits. Visualized tracheal air column is within normal limits. Upper limits of normal to mildly hyperinflated lung volumes. Both lungs appear clear. No pneumothorax or definite effusion. Thoracic kyphoscoliosis. No acute osseous abnormality identified. Paucity of bowel gas in the upper abdomen. Abdominal Calcified aortic atherosclerosis. IMPRESSION: 1. No acute cardiopulmonary abnormality. 2. Prior CABG. Borderline cardiomegaly. Suspected chronic pulmonary hyperinflation. Aortic Atherosclerosis (ICD10-I70.0). Electronically Signed   By: Genevie Ann M.D.   On: 08/17/2021 10:56   CT Angio Chest Pulmonary Embolism (PE) W or WO Contrast  Result Date: 08/17/2021 CLINICAL DATA:  Shortness of breath. Low oxygen saturation. Pulmonary embolism suspected. EXAM: CT ANGIOGRAPHY CHEST WITH CONTRAST TECHNIQUE: Multidetector CT imaging of the chest was performed using the standard protocol during bolus administration of intravenous contrast. Multiplanar CT image reconstructions and MIPs were obtained to evaluate  the vascular anatomy. RADIATION DOSE REDUCTION: This exam was performed according to the departmental dose-optimization program which includes automated exposure control, adjustment of the mA and/or kV according to patient size and/or use of iterative reconstruction technique. CONTRAST:  57mL OMNIPAQUE IOHEXOL 350 MG/ML SOLN COMPARISON:  Chest radiographs 08/17/2021 FINDINGS: Cardiovascular: Pulmonary arterial opacification is adequate without evidence of emboli within limitations of intermittent motion artifact. There is thoracic aortic atherosclerosis without aneurysm. Coronary atherosclerosis and prior CABG are noted. The heart is enlarged including prominent  biatrial enlargement. There is no pericardial effusion. Mediastinum/Nodes: No enlarged axillary, mediastinal, or hilar lymph nodes. Unremarkable thyroid and esophagus. Possible small sliding hiatal hernia. Lungs/Pleura: No pleural effusion or pneumothorax. Scattered mild bilateral lung scarring. No mass. Grossly patent central airways. Upper Abdomen: No acute abnormality. Musculoskeletal: Multiple old left rib fractures. Mild T12 compression fracture which is chronic in appearance. Review of the MIP images confirms the above findings. IMPRESSION: 1. No evidence of pulmonary emboli or other acute abnormality in the chest. 2. Cardiomegaly. 3. Aortic Atherosclerosis (ICD10-I70.0). Electronically Signed   By: Logan Bores M.D.   On: 08/17/2021 13:20   DG Chest Port 1 View  Result Date: 08/18/2021 CLINICAL DATA:  Shortness of breath.  Generalized weakness. EXAM: PORTABLE CHEST 1 VIEW COMPARISON:  Radiograph and chest CTA yesterday. FINDINGS: Post median sternotomy and CABG. Mild cardiomegaly. Unchanged mediastinal contours. Aortic atherosclerosis and tortuosity. No confluent airspace disease, pulmonary edema, pleural effusion or pneumothorax. Multiple remote left rib fractures. Prior right [facility] joint separation. The bones are under mineralized. IMPRESSION: 1. No acute abnormality or change from radiographs and CT yesterday. 2. Stable mild cardiomegaly. Electronically Signed   By: Keith Rake M.D.   On: 08/18/2021 17:37    Microbiology: Results for orders placed or performed during the hospital encounter of 08/18/21  Resp Panel by RT-PCR (Flu A&B, Covid) Nasopharyngeal Swab     Status: None   Collection Time: 08/18/21  5:25 PM   Specimen: Nasopharyngeal Swab; Nasopharyngeal(NP) swabs in vial transport medium  Result Value Ref Range Status   SARS Coronavirus 2 by RT PCR NEGATIVE NEGATIVE Final    Comment: (NOTE) SARS-CoV-2 target nucleic acids are NOT DETECTED.  The SARS-CoV-2 RNA is generally detectable  in upper respiratory specimens during the acute phase of infection. The lowest concentration of SARS-CoV-2 viral copies this assay can detect is 138 copies/mL. A negative result does not preclude SARS-Cov-2 infection and should not be used as the sole basis for treatment or other patient management decisions. A negative result may occur with  improper specimen collection/handling, submission of specimen other than nasopharyngeal swab, presence of viral mutation(s) within the areas targeted by this assay, and inadequate number of viral copies(<138 copies/mL). A negative result must be combined with clinical observations, patient history, and epidemiological information. The expected result is Negative.  Fact Sheet for Patients:  EntrepreneurPulse.com.au  Fact Sheet for Healthcare Providers:  IncredibleEmployment.be  This test is no t yet approved or cleared by the [place] FDA and  has been authorized for detection and/or diagnosis of SARS-CoV-2 by FDA under an Emergency Use Authorization (EUA). This EUA will remain  in effect (meaning this test can be used) for the duration of the COVID-19 declaration under Section 564(b)(1) of the Act, 21 U.S.C.section 360bbb-3(b)(1), unless the authorization is terminated  or revoked sooner.       Influenza A by PCR NEGATIVE NEGATIVE Final   Influenza B by PCR NEGATIVE NEGATIVE Final    Comment: (NOTE) The  Xpert Xpress SARS-CoV-2/FLU/RSV plus assay is intended as an aid in the diagnosis of influenza from Nasopharyngeal swab specimens and should not be used as a sole basis for treatment. Nasal washings and aspirates are unacceptable for Xpert Xpress SARS-CoV-2/FLU/RSV testing.  Fact Sheet for Patients: EntrepreneurPulse.com.au  Fact Sheet for Healthcare Providers: IncredibleEmployment.be  This test is not yet approved or cleared by the [place] FDA and has  been authorized for detection and/or diagnosis of SARS-CoV-2 by FDA under an Emergency Use Authorization (EUA). This EUA will remain in effect (meaning this test can be used) for the duration of the COVID-19 declaration under Section 564(b)(1) of the Act, 21 U.S.C. section 360bbb-3(b)(1), unless the authorization is terminated or revoked.  Performed at [facility], [address], [place], [place] [address]     Labs: CBC: Recent Labs  Lab 08/17/21 1122 08/18/21 1657 08/19/21 0154 08/19/21 0739 08/19/21 1140 08/20/21 0413 08/21/21 0452 08/21/21 1354  WBC 9.0 10.5   < > 10.6* 10.5 5.6 6.6 7.9  NEUTROABS 7.8* 7.6  --   --   --   --   --   --   HGB 8.4* 6.3*   < > 8.6* 8.7* 7.5* 8.8* 9.3*  HCT 25.0* 18.1*   < > 26.3* 26.5* 22.6* 28.4* 29.4*  MCV 92 93.3   < > 90.4 91.1 91.5 91.0 93.9  PLT 196 176   < > 145* 160 140* 168 173   < > = values in this interval not displayed.   Basic Metabolic Panel: Recent Labs  Lab 08/17/21 1122 08/17/21 1251 08/18/21 1657 08/19/21 0154 08/20/21 0413 08/21/21 0452  NA 141  --  136 138 138 137  K 4.2  --  3.2* 3.7 3.1* 4.2  CL 99  --  100 103 105 106  CO2 25  --  26 29 28 26   GLUCOSE 150*  --  167* 140* 110* 117*  BUN 52*  --  60* 47* 23 16  CREATININE 0.81 1.00 0.76 0.73 0.65 0.58  CALCIUM 10.5*  --  9.6 9.2 8.4* 8.4*  MG  --   --   --  1.4*  --  1.9   Liver Function Tests: Recent Labs  Lab 08/17/21 1122 08/18/21 1657 08/19/21 0154  AST 20 19 18   ALT 12 14 13   ALKPHOS 56 43 39  BILITOT 0.9 0.7 1.3*  PROT 5.7* 5.8* 5.5*  ALBUMIN 4.2 3.5 3.4*   CBG: Recent Labs  Lab 08/20/21 1139 08/20/21 1852 08/21/21 0006 08/21/21 0513 08/21/21 1202  GLUCAP 113* 176* 89 179* 132*    Discharge time spent: greater than 30 minutes.  Signed: Kathie Dike, MD [facility] 08/21/2021

## 2021-08-21 NOTE — Assessment & Plan Note (Signed)
Noted to have mild erythema in antecubital fossa of left arm where IV was present.  Started on a short course of doxycycline and advised to use warm compresses over this area. ?

## 2021-08-21 NOTE — Telephone Encounter (Signed)
Virginia [name] - If you could please arrange for patient to have surveillance EGD for duodenal stenosis in 2 months.  ? ?Thank you, ? ?Brooke Bonito, MSN, FNP-BC, AGACNP-BC ?[facility] ? ?

## 2021-08-22 ENCOUNTER — Telehealth: Payer: Self-pay

## 2021-08-22 ENCOUNTER — Encounter (HOSPITAL_COMMUNITY): Payer: Self-pay | Admitting: Gastroenterology

## 2021-08-22 NOTE — Telephone Encounter (Signed)
Transition Care Management Unsuccessful Follow-up Telephone Call ? ?Date of discharge and from where:  08/21/21 - Virginia [name] - GI hemorrhage/ anemia ? ?Attempts:  1st Attempt ? ?Reason for unsuccessful TCM follow-up call:  Left voice message ? ?Transition Care Management Unsuccessful Follow-up Telephone Call ? ?Date of discharge and from where:  08/21/21 [name] ? ?Attempts:  2nd Attempt ? ?Reason for unsuccessful TCM follow-up call:  No answer/busy ? ?Transition Care Management Unsuccessful Follow-up Telephone Call ? ?Date of discharge and from where:  08/21/21 - Virginia [name] - GI bleed ? ?Attempts:  3rd Attempt ? ?Reason for unsuccessful TCM follow-up call:  Left voice message ? ?  ? ?  ? ?  ?

## 2021-08-22 NOTE — Telephone Encounter (Signed)
I called and left a message asked that the patient please return call.  

## 2021-08-23 ENCOUNTER — Telehealth: Payer: Self-pay

## 2021-08-23 ENCOUNTER — Other Ambulatory Visit: Payer: Self-pay

## 2021-08-23 ENCOUNTER — Other Ambulatory Visit (INDEPENDENT_AMBULATORY_CARE_PROVIDER_SITE_OTHER): Payer: Self-pay

## 2021-08-23 DIAGNOSIS — K315 Obstruction of duodenum: Secondary | ICD-10-CM

## 2021-08-23 DIAGNOSIS — K2971 Gastritis, unspecified, with bleeding: Secondary | ICD-10-CM

## 2021-08-23 LAB — SURGICAL PATHOLOGY

## 2021-08-23 NOTE — Telephone Encounter (Signed)
Patient with diagnosis of A Fib on Eliquis for anticoagulation.   ? ?Procedure: EGD ?Date of procedure: 10/17/21 ? ? ?CHA2DS2-VASc Score = 6  ?This indicates a 9.7% annual risk of stroke. ?The patient's score is based upon: ?CHF History: 0 ?HTN History: 1 ?Diabetes History: 1 ?Stroke History: 0 ?Vascular Disease History: 1 ?Age Score: 2 ?Gender Score: 1 ? ?CrCl 82 mL/min ?Platelet count 173K ? ? ?Per office protocol, patient can hold Eliquis for 2 days prior to procedure.  ?

## 2021-08-23 NOTE — Telephone Encounter (Signed)
? ?  Pre-operative Risk Assessment  ?  ?Patient Name: Virginia [name]  ?DOB: [birth year] ?MRN: [id number] ? ?  ? ?Request for Surgical Clearance ?   ? ?Procedure:   Upper Endoscopy/ EGD ? ?Date of Surgery:  Clearance 10/17/21                              ? ?Surgeon:  Dr Maylon Peppers Mayorga ?Surgeon's Group or Practice Name:  [facility]  ?Phone number:  [phone] ?Fax number:  [phone] ? ?Type of Clearance Requested:   ?- Pharmacy:  Hold Apixaban (Eliquis)   ? ?Type of Anesthesia:  MAC ? ?Additional requests/questions:   N/A ? ?Signed, ?Ulice Brilliant T   ?08/23/2021, 1:01 PM  ? ?

## 2021-08-23 NOTE — Telephone Encounter (Signed)
Patient sister Olegario Messier is aware and will make sure the patient has this checked this week at [facility]. Order placed. ?

## 2021-08-23 NOTE — Telephone Encounter (Signed)
Transition Care Management Follow-up Telephone Call ?Date of discharge and from where: [facility]/21/21 [facility] - [facility] bleed/ anemia ?How have you been since you were released from the hospital? Doing much better ?Any questions or concerns? Yes - wasn't sent home with iron supplement - should she get otc? ? ?Items Reviewed: ?Did the pt receive and understand the discharge instructions provided? Yes  ?Medications obtained and verified? Yes  ?Other?  Wants to know if she should be taking iron supplement ?Any new allergies since your discharge? No  ?Dietary orders reviewed? Yes ?Do you have support at home? Yes  ? ?Home Care and Equipment/Supplies: ?Were home health services ordered? no ?If so, what is the name of the agency? N/a  ?Has the agency set up a time to come to the patient's home? not applicable ?Were any new equipment or medical supplies ordered?  No ?What is the name of the medical supply agency? N/a ?Were you able to get the supplies/equipment? not applicable ?Do you have any questions related to the use of the equipment or supplies? No ? ?Functional Questionnaire: (I = Independent and D = Dependent) ?ADLs: I ? ?Bathing/Dressing- I ? ?Meal Prep- D ? ?Eating- I ? ?Maintaining continence- I ? ?Transferring/Ambulation- I ? ?Managing Meds- D ? ?Follow up appointments reviewed: ? ?PCP Hospital f/u appt confirmed? Yes  Scheduled to see Jannifer Rodney on 08/31/21 @ 3:10 ?Specialist Hospital f/u appt confirmed? Yes  Scheduled to see GI specialist on 09/11/21 @ 3:30. ?Are transportation arrangements needed? No  ?If their condition worsens, is the pt aware to call PCP or go to the Emergency Dept.? Yes ?Was the patient provided with contact information for the PCP's office or ED? Yes ?Was to pt encouraged to call back with questions or concerns? Yes  ?

## 2021-08-23 NOTE — Telephone Encounter (Signed)
Patient Name: Virginia [name]  ?DOB: [birth year] ?MRN: [id number] ?  ?Eliquis for atrial fibrillation ?Request to hold for EGD 10/17/21 ? ? ? ?

## 2021-08-23 NOTE — Telephone Encounter (Signed)
? ?  Primary Cardiologist: Quay Burow, MD ? ?Chart reviewed as part of pre-operative protocol coverage. Virginia [name] takes Eliquis for atrial fibrillation. Her past medical history has been reviewed by our clinical pharmacist and the guidelines for holding Eliquis are as follows:  ?Patient with diagnosis of A Fib on Eliquis for anticoagulation.   ?  ?Procedure: EGD ?Date of procedure: 10/17/21 ?  ?CHA2DS2-VASc Score = 6  ?This indicates a 9.7% annual risk of stroke. ?The patient's score is based upon: ?CHF History: 0 ?HTN History: 1 ?Diabetes History: 1 ?Stroke History: 0 ?Vascular Disease History: 1 ?Age Score: 2 ?Gender Score: 1 ?  ?CrCl 82 mL/min ?Platelet count 173K ?  ?Per office protocol, patient can hold Eliquis for 2 days prior to procedure.  ? ?I will route this recommendation to the requesting party via Epic fax function and remove from pre-op pool. ? ?Please call with questions. ? ?Emmaline Life, NP-C ? ?  ?08/23/2021, 2:24 PM ?[place] ?[id number] ?Office [phone] Fax [phone] ? ? ?

## 2021-08-24 DIAGNOSIS — K2971 Gastritis, unspecified, with bleeding: Secondary | ICD-10-CM | POA: Diagnosis not present

## 2021-08-25 LAB — CBC WITH DIFFERENTIAL/PLATELET
Basophils Absolute: 0 10*3/uL (ref 0.0–0.2)
Basos: 0 %
EOS (ABSOLUTE): 0.2 10*3/uL (ref 0.0–0.4)
Eos: 3 %
Hematocrit: 31.2 % — ABNORMAL LOW (ref 34.0–46.6)
Hemoglobin: 10.2 g/dL — ABNORMAL LOW (ref 11.1–15.9)
Immature Grans (Abs): 0 10*3/uL (ref 0.0–0.1)
Immature Granulocytes: 0 %
Lymphocytes Absolute: 1.8 10*3/uL (ref 0.7–3.1)
Lymphs: 25 %
MCH: 28.9 pg (ref 26.6–33.0)
MCHC: 32.7 g/dL (ref 31.5–35.7)
MCV: 88 fL (ref 79–97)
Monocytes Absolute: 0.8 10*3/uL (ref 0.1–0.9)
Monocytes: 11 %
Neutrophils Absolute: 4.4 10*3/uL (ref 1.4–7.0)
Neutrophils: 61 %
Platelets: 264 10*3/uL (ref 150–450)
RBC: 3.53 x10E6/uL — ABNORMAL LOW (ref 3.77–5.28)
RDW: 14.9 % (ref 11.7–15.4)
WBC: 7.2 10*3/uL (ref 3.4–10.8)

## 2021-08-28 ENCOUNTER — Encounter (INDEPENDENT_AMBULATORY_CARE_PROVIDER_SITE_OTHER): Payer: Self-pay

## 2021-08-31 ENCOUNTER — Ambulatory Visit (INDEPENDENT_AMBULATORY_CARE_PROVIDER_SITE_OTHER): Payer: Medicare Other | Admitting: Family

## 2021-08-31 ENCOUNTER — Encounter: Payer: Self-pay | Admitting: Family

## 2021-08-31 VITALS — BP 156/84 | HR 83 | Temp 97.4°F | Ht 65.0 in | Wt 158.4 lb

## 2021-08-31 DIAGNOSIS — K922 Gastrointestinal hemorrhage, unspecified: Secondary | ICD-10-CM | POA: Diagnosis not present

## 2021-08-31 DIAGNOSIS — K219 Gastro-esophageal reflux disease without esophagitis: Secondary | ICD-10-CM

## 2021-08-31 DIAGNOSIS — D649 Anemia, unspecified: Secondary | ICD-10-CM | POA: Diagnosis not present

## 2021-08-31 DIAGNOSIS — Z09 Encounter for follow-up examination after completed treatment for conditions other than malignant neoplasm: Secondary | ICD-10-CM | POA: Diagnosis not present

## 2021-08-31 DIAGNOSIS — R531 Weakness: Secondary | ICD-10-CM

## 2021-08-31 DIAGNOSIS — G252 Other specified forms of tremor: Secondary | ICD-10-CM

## 2021-08-31 DIAGNOSIS — R799 Abnormal finding of blood chemistry, unspecified: Secondary | ICD-10-CM | POA: Diagnosis not present

## 2021-08-31 NOTE — Progress Notes (Signed)
? ?Subjective:  ? ? Patient ID: Virginia [name], female    DOB: Feb 13, 1941, 81 y.o.   MRN: [id number] ? ?Chief Complaint  ?Patient presents with  ? Hospitalization Follow-up  ?  Shakey bad   ? ?PT presents to the office today for hospital follow up. She was seen on 08/17/20 for weakness and SOB.  ? ?She was diagnosed with anemia and GI bleed. She was found to have a hgb of 6.3. She had an EGD that showed duodenal ulcer without any active bleeding as well as nonbleeding Cameron lesions and hiatal hernia.  ? ?She stopped her NSAID's and stopped her Eliqus.  ? ?She has a follow up with GI on 09/11/21 to repeat EGD.  ? ?Started PPI BID. Her Eliquis restarted on discharge.  ? ?She is complaining of worsening tremors.  ?Anemia ?Presents for follow-up visit. Symptoms include malaise/fatigue. There has been no anorexia, leg swelling, light-headedness or paresthesias.  ? ? ? ?Review of Systems  ?Constitutional:  Positive for malaise/fatigue.  ?Gastrointestinal:  Negative for anorexia.  ?Neurological:  Negative for light-headedness and paresthesias.  ?All other systems reviewed and are negative. ? ?   ?Objective:  ? Physical Exam ?Vitals reviewed.  ?Constitutional:   ?   General: She is not in acute distress. ?   Appearance: She is well-developed.  ?HENT:  ?   Head: Normocephalic and atraumatic.  ?Eyes:  ?   Pupils: Pupils are equal, round, and reactive to light.  ?Neck:  ?   Thyroid: No thyromegaly.  ?Cardiovascular:  ?   Rate and Rhythm: Normal rate and regular rhythm.  ?   Heart sounds: Normal heart sounds. No murmur heard. ?Pulmonary:  ?   Effort: Pulmonary effort is normal. No respiratory distress.  ?   Breath sounds: Normal breath sounds. No wheezing.  ?Abdominal:  ?   General: Bowel sounds are normal. There is no distension.  ?   Palpations: Abdomen is soft.  ?   Tenderness: There is no abdominal tenderness.  ?Musculoskeletal:     ?   General: No tenderness. Normal range of motion.  ?   Cervical back: Normal range of  motion and neck supple.  ?Skin: ?   General: Skin is warm and dry.  ?   Coloration: Skin is pale.  ?Neurological:  ?   Mental Status: She is alert and oriented to person, place, and time.  ?   Cranial Nerves: No cranial nerve deficit.  ?   Motor: Weakness present.  ?   Deep Tendon Reflexes: Reflexes are normal and symmetric.  ?   Comments: Resting tremor present   ?Psychiatric:     ?   Behavior: Behavior normal.     ?   Thought Content: Thought content normal.     ?   Judgment: Judgment normal.  ? ? ? ?BP (!) 156/84   Pulse 83   Temp (!) 97.4 ?F (36.3 ?C) (Temporal)   Ht 5\' 5"  (1.651 m)   Wt 158 lb 6.4 oz (71.8 kg)   BMI 26.36 kg/m?  ? ? ?   ?Assessment & Plan:  ?JOURDEN HOPKINS comes in today with chief complaint of Hospitalization Follow-up (Shakey bad ) ? ? ?Diagnosis and orders addressed: ? ?1. Gastrointestinal hemorrhage, unspecified gastrointestinal hemorrhage type ?- CBC with Differential/Platelet ?- Anemia Profile B ? ?2. Anemia, unspecified type ?- CBC with Differential/Platelet ?- Anemia Profile B ? ?3. Weakness ?- CBC with Differential/Platelet ?- Anemia Profile B ? ?4. Gastroesophageal reflux  disease, unspecified whether esophagitis present ?- CBC with Differential/Platelet ?- Anemia Profile B ? ?5. Elevated BUN ?- CBC with Differential/Platelet ?- Anemia Profile B ? ?6. Generalized weakness ?- CBC with Differential/Platelet ?- Anemia Profile B ? ?7. Hospital discharge follow-up ?- CBC with Differential/Platelet ?- Anemia Profile B ? ?8. Resting tremor ?Follow up with Neurologists  ? ? ?Labs pending ?Health Maintenance reviewed ?Diet and exercise encouraged ? ?Follow up plan: ?3 months keep follow up with GI ? ? ?Evelina Dun, FNP ? ? ? ?

## 2021-08-31 NOTE — Patient Instructions (Signed)
Gastrointestinal Bleeding ?Gastrointestinal (GI) bleeding is bleeding somewhere along the digestive tract, between the mouth and the anus. The digestive tract includes the mouth, esophagus, stomach, small intestine, large intestine, and anus. The large intestine is often called the colon. ?GI bleeding can be caused by various problems. The severity of these problems can be mild, serious, or life-threatening. If you have GI bleeding, you may find blood in your stools (feces), you may have black stools, or you may vomit blood. You may need to stay in the hospital if there is a lot of bleeding. ?What are the causes? ?This condition may be caused by: ?Inflammation, irritation, or swelling of the esophagus (esophagitis). The esophagus is part of the body that moves food from your mouth to your stomach. ?Swollen veins in the rectum (hemorrhoids). ?Tears in the anus (anal fissures). The tears are often caused by passing hard stool. ?Pouches that form on the colon and may bleed (diverticulosis). ?Inflammation in areas with diverticulosis. This is called diverticulitis.This can cause pain, fever, and bloody stools. ?Growths (polyps) or cancer. Colon cancer often starts out as precancerous polyps. ?Gastritis and ulcers. These may cause bleeding in the upper GI tract, near the stomach. ?What increases the risk? ?You are more likely to develop this condition if: ?You have an infection in your stomach from a type of bacteria called Helicobacter pylori. ?You take certain medicines, such as: ?NSAIDs. ?Aspirin. ?Selective serotonin reuptake inhibitors (SSRIs). ?Steroids. ?Antiplatelet or anticoagulant medicines. ?You smoke. ?You drink alcohol. ?What are the signs or symptoms? ?Common symptoms of this condition include: ?Bright red blood in your vomit, or vomit that looks like coffee grounds. ?Bloody, black, or tarry stools. ?Bleeding from the lower GI tract will usually cause red or maroon blood in the stools. ?Bleeding from the  upper GI tract may cause black, tarry stools that are often stronger smelling than usual. ?In certain cases, if the bleeding is fast enough, the stools may be red. ?Pain or cramping in the abdomen. ?How is this diagnosed? ?This condition may be diagnosed based on: ?Your medical history and a physical exam. ?Various tests, such as: ?Blood tests. ?Stool tests. ?X-rays and other imaging tests. ?Esophagogastroduodenoscopy (EGD). In this test, a flexible, lighted tube is used to look at your esophagus, stomach, and small intestine. ?Colonoscopy. In this test, a flexible, lighted tube is used to look at your colon. ?How is this treated? ?Treatment for this condition depends on the cause of the bleeding. For example: ?For bleeding from the esophagus, stomach, small intestine, or colon, the health care provider may do a procedure to stop bleeding during your EGD or colonoscopy. ?Inflammation or infection of the colon can be treated with medicines. ?Certain rectal problems can be treated with creams, suppositories, or warm baths. ?Medicines may be given to reduce acid in your stomach. ?Surgery is sometimes done. ?Blood transfusions are sometimes needed if a lot of blood has been lost. ?If there is a lot of bleeding, you will need to stay in the hospital for observation. If bleeding is mild, you may be allowed to go home. ?Follow these instructions at home: ? ?Take over-the-counter and prescription medicines only as told by your health care provider. ?Eat foods that are high in fiber, such as beans, whole grains, and fresh fruits and vegetables. This will help to keep your stools soft. Eating 1-3 prunes each day works well for many people. ?Drink enough fluid to keep your urine pale yellow. ?Keep all follow-up visits. This is important. ?Contact a   health care provider if: ?Your symptoms do not improve with treatment. ?Get help right away if: ?Your bleeding does not stop. ?You feel light-headed or you faint. ?You feel  weak. ?You have severe cramps in your back or abdomen. ?You pass large blood clots in your stool. ?Your symptoms are getting worse. ?You have chest pain or fast heartbeats. ?These symptoms may be an emergency. Get help right away. Call 911. ?Do not wait to see if the symptoms will go away. ?Do not drive yourself to the hospital. ?Summary ?Gastrointestinal (GI) bleeding is bleeding somewhere along the digestive tract, between the mouth and anus. GI bleeding can be caused by various problems. ?Treatment for this condition depends on the cause of the bleeding. ?Take over-the-counter and prescription medicines only as told by your health care provider. ?Get help right away if your bleeding increases, your symptoms are getting worse, or you have new symptoms. ?Keep all follow-up visits. This is important. ?This information is not intended to replace advice given to you by your health care provider. Make sure you discuss any questions you have with your health care provider. ?Document Revised: 01/06/2021 Document Reviewed: 01/06/2021 ?Elsevier Patient Education ? [place]. ? ?

## 2021-09-01 ENCOUNTER — Other Ambulatory Visit: Payer: Self-pay | Admitting: Family

## 2021-09-01 DIAGNOSIS — I480 Paroxysmal atrial fibrillation: Secondary | ICD-10-CM

## 2021-09-01 LAB — ANEMIA PROFILE B
Basophils Absolute: 0.1 10*3/uL (ref 0.0–0.2)
Basos: 1 %
EOS (ABSOLUTE): 0.2 10*3/uL (ref 0.0–0.4)
Eos: 2 %
Ferritin: 24 ng/mL (ref 15–150)
Folate: >20 ng/mL (ref >3.0)
Hematocrit: 30.5 % — ABNORMAL LOW (ref 34.0–46.6)
Hemoglobin: 9.6 g/dL — ABNORMAL LOW (ref 11.1–15.9)
Immature Grans (Abs): 0 10*3/uL (ref 0.0–0.1)
Immature Granulocytes: 0 %
Iron Saturation: 6 % — CL (ref 15–55)
Iron: 17 ug/dL — ABNORMAL LOW (ref 27–139)
Lymphocytes Absolute: 2.4 10*3/uL (ref 0.7–3.1)
Lymphs: 30 %
MCH: 28.2 pg (ref 26.6–33.0)
MCHC: 31.5 g/dL (ref 31.5–35.7)
MCV: 90 fL (ref 79–97)
Monocytes Absolute: 0.7 10*3/uL (ref 0.1–0.9)
Monocytes: 8 %
Neutrophils Absolute: 4.6 10*3/uL (ref 1.4–7.0)
Neutrophils: 59 %
Platelets: 292 10*3/uL (ref 150–450)
RBC: 3.4 x10E6/uL — ABNORMAL LOW (ref 3.77–5.28)
RDW: 14.6 % (ref 11.7–15.4)
Retic Ct Pct: 2.6 % (ref 0.6–2.6)
Total Iron Binding Capacity: 286 ug/dL (ref 250–450)
UIBC: 269 ug/dL (ref 118–369)
Vitamin B-12: 247 pg/mL (ref 232–1245)
WBC: 7.9 10*3/uL (ref 3.4–10.8)

## 2021-09-05 ENCOUNTER — Ambulatory Visit: Payer: Medicare Other | Admitting: Diagnostic Neuroimaging

## 2021-09-11 ENCOUNTER — Ambulatory Visit (INDEPENDENT_AMBULATORY_CARE_PROVIDER_SITE_OTHER): Payer: Medicare Other | Admitting: Gastroenterology

## 2021-09-13 ENCOUNTER — Telehealth: Payer: Self-pay | Admitting: Family

## 2021-09-13 NOTE — Telephone Encounter (Signed)
Pt is scheduled to having testing done at [facility] on 4/28 but her friend called and said that pt is having more bleeding in her bowels and they are all very worried about pt. ? ?Please call patients sister Juliann Pulse. ?

## 2021-09-13 NOTE — Telephone Encounter (Signed)
I spoke with pt's sister and advised it looks like someone cancelled her appt with GI via the automated reminder system so they need to call and get that appt rescheduled and if pt continues to have blood in her stool or starts to get weak, dizzy, feel like she will pass out before she can see Gi she should be evaluated and sister voiced understanding. ?

## 2021-10-12 NOTE — Patient Instructions (Signed)
? ? ? ? ? ? Virginia [name] ? 10/12/2021  ?  ? @PREFPERIOPPHARMACY @ ? ? Your procedure is scheduled on  10/17/2021. ? ? Report to 12/17/2021 at  0600 A.M. ? ? Call this number if you have problems the morning of surgery: ? [phone] ? ? Remember: ? Follow the diet instructions given to you by the office. ? ?  Your last dose of eliquis should be on 10/13/2021. ?  ? ? Take these medicines the morning of surgery with A SIP OF WATER  ? ?     hydrocodone(if needed), metoprolol, protonix, paxil. ?  ? ? Do not wear jewelry, make-up or nail polish. ? Do not wear lotions, powders, or perfumes, or deodorant. ? Do not shave 48 hours prior to surgery.  Men may shave face and neck. ? Do not bring valuables to the hospital. ?  is not responsible for any belongings or valuables. ? ?Contacts, dentures or bridgework may not be worn into surgery.  Leave your suitcase in the car.  After surgery it may be brought to your room. ? ?For patients admitted to the hospital, discharge time will be determined by your treatment team. ? ?Patients discharged the day of surgery will not be allowed to drive home and must have someone with them for 24 hours.  ? ? ?Special instructions:   DO NOT smoke tobacco or vape for 24 hours before your procedure. ? ?Please read over the following fact sheets that you were given. ?Anesthesia Post-op Instructions and Care and Recovery After Surgery ?  ? ? ? Upper Endoscopy, Adult, Care After ?This sheet gives you information about how to care for yourself after your procedure. Your health care provider may also give you more specific instructions. If you have problems or questions, contact your health care provider. ?What can I expect after the procedure? ?After the procedure, it is common to have: ?A sore throat. ?Mild stomach pain or discomfort. ?Bloating. ?Nausea. ?Follow these instructions at home: ? ?Follow instructions from your health care provider about what to eat or drink after your  procedure. ?Return to your normal activities as told by your health care provider. Ask your health care provider what activities are safe for you. ?Take over-the-counter and prescription medicines only as told by your health care provider. ?If you were given a sedative during the procedure, it can affect you for several hours. Do not drive or operate machinery until your health care provider says that it is safe. ?Keep all follow-up visits as told by your health care provider. This is important. ?Contact a health care provider if you have: ?A sore throat that lasts longer than one day. ?Trouble swallowing. ?Get help right away if: ?You vomit blood or your vomit looks like coffee grounds. ?You have: ?A fever. ?Bloody, black, or tarry stools. ?A severe sore throat or you cannot swallow. ?Difficulty breathing. ?Severe pain in your chest or abdomen. ?Summary ?After the procedure, it is common to have a sore throat, mild stomach discomfort, bloating, and nausea. ?If you were given a sedative during the procedure, it can affect you for several hours. Do not drive or operate machinery until your health care provider says that it is safe. ?Follow instructions from your health care provider about what to eat or drink after your procedure. ?Return to your normal activities as told by your health care provider. ?This information is not intended to replace advice given to you by your health care provider. Make sure  you discuss any questions you have with your health care provider. ?Document Revised: 04/10/2019 Document Reviewed: 11/04/2017 ?Elsevier Patient Education ? 2023 Elsevier [employer] ?Monitored Anesthesia Care, Care After ?This sheet gives you information about how to care for yourself after your procedure. Your health care provider may also give you more specific instructions. If you have problems or questions, contact your health care provider. ?What can I expect after the procedure? ?After the procedure, it is common to  have: ?Tiredness. ?Forgetfulness about what happened after the procedure. ?Impaired judgment for important decisions. ?Nausea or vomiting. ?Some difficulty with balance. ?Follow these instructions at home: ?For the time period you were told by your health care provider: ? ?  ? ?Rest as needed. ?Do not participate in activities where you could fall or become injured. ?Do not drive or use machinery. ?Do not drink alcohol. ?Do not take sleeping pills or medicines that cause drowsiness. ?Do not make important decisions or sign legal documents. ?Do not take care of children on your own. ?Eating and drinking ?Follow the diet that is recommended by your health care provider. ?Drink enough fluid to keep your urine pale yellow. ?If you vomit: ?Drink water, juice, or soup when you can drink without vomiting. ?Make sure you have little or no nausea before eating solid foods. ?General instructions ?Have a responsible adult stay with you for the time you are told. It is important to have someone help care for you until you are awake and alert. ?Take over-the-counter and prescription medicines only as told by your health care provider. ?If you have sleep apnea, surgery and certain medicines can increase your risk for breathing problems. Follow instructions from your health care provider about wearing your sleep device: ?Anytime you are sleeping, including during daytime naps. ?While taking prescription pain medicines, sleeping medicines, or medicines that make you drowsy. ?Avoid smoking. ?Keep all follow-up visits as told by your health care provider. This is important. ?Contact a health care provider if: ?You keep feeling nauseous or you keep vomiting. ?You feel light-headed. ?You are still sleepy or having trouble with balance after 24 hours. ?You develop a rash. ?You have a fever. ?You have redness or swelling around the IV site. ?Get help right away if: ?You have trouble breathing. ?You have new-onset confusion at  home. ?Summary ?For several hours after your procedure, you may feel tired. You may also be forgetful and have poor judgment. ?Have a responsible adult stay with you for the time you are told. It is important to have someone help care for you until you are awake and alert. ?Rest as told. Do not drive or operate machinery. Do not drink alcohol or take sleeping pills. ?Get help right away if you have trouble breathing, or if you suddenly become confused. ?This information is not intended to replace advice given to you by your health care provider. Make sure you discuss any questions you have with your health care provider. ?Document Revised: 05/09/2021 Document Reviewed: 05/07/2019 ?Elsevier Patient Education ? 2023 Elsevier Inc. ? ?

## 2021-10-13 ENCOUNTER — Encounter (HOSPITAL_COMMUNITY)
Admission: RE | Admit: 2021-10-13 | Discharge: 2021-10-13 | Disposition: A | Discharge status: Home / Self Care | Payer: Medicare Other | Source: Ambulatory Visit | Attending: Gastroenterology | Admitting: Gastroenterology

## 2021-10-13 ENCOUNTER — Encounter (HOSPITAL_COMMUNITY): Payer: Self-pay

## 2021-10-13 VITALS — BP 163/91 | HR 80 | Temp 97.6°F | Resp 18 | Ht 65.0 in | Wt 150.0 lb

## 2021-10-13 DIAGNOSIS — D649 Anemia, unspecified: Secondary | ICD-10-CM | POA: Insufficient documentation

## 2021-10-13 DIAGNOSIS — Z01812 Encounter for preprocedural laboratory examination: Secondary | ICD-10-CM | POA: Insufficient documentation

## 2021-10-13 DIAGNOSIS — E1169 Type 2 diabetes mellitus with other specified complication: Secondary | ICD-10-CM

## 2021-10-13 DIAGNOSIS — R799 Abnormal finding of blood chemistry, unspecified: Secondary | ICD-10-CM

## 2021-10-13 DIAGNOSIS — K315 Obstruction of duodenum: Secondary | ICD-10-CM

## 2021-10-13 HISTORY — DX: Gastro-esophageal reflux disease without esophagitis: K21.9

## 2021-10-13 LAB — CBC WITH DIFFERENTIAL/PLATELET
Abs Immature Granulocytes: 0.02 10*3/uL (ref 0.00–0.07)
Basophils Absolute: 0 10*3/uL (ref 0.0–0.1)
Basophils Relative: 1 %
Eosinophils Absolute: 0.2 10*3/uL (ref 0.0–0.5)
Eosinophils Relative: 2 %
HCT: 38.4 % (ref 36.0–46.0)
Hemoglobin: 11.8 g/dL — ABNORMAL LOW (ref 12.0–15.0)
Immature Granulocytes: 0 %
Lymphocytes Relative: 23 %
Lymphs Abs: 1.4 10*3/uL (ref 0.7–4.0)
MCH: 27.6 pg (ref 26.0–34.0)
MCHC: 30.7 g/dL (ref 30.0–36.0)
MCV: 89.7 fL (ref 80.0–100.0)
Monocytes Absolute: 0.5 10*3/uL (ref 0.1–1.0)
Monocytes Relative: 8 %
Neutro Abs: 4.1 10*3/uL (ref 1.7–7.7)
Neutrophils Relative %: 66 %
Platelets: 233 10*3/uL (ref 150–400)
RBC: 4.28 MIL/uL (ref 3.87–5.11)
RDW: 16.1 % — ABNORMAL HIGH (ref 11.5–15.5)
WBC: 6.2 10*3/uL (ref 4.0–10.5)
nRBC: 0 % (ref 0.0–0.2)

## 2021-10-13 LAB — BASIC METABOLIC PANEL
Anion gap: 8 (ref 5–15)
BUN: 8 mg/dL (ref 8–23)
CO2: 28 mmol/L (ref 22–32)
Calcium: 9.5 mg/dL (ref 8.9–10.3)
Chloride: 103 mmol/L (ref 98–111)
Creatinine, Ser: 0.74 mg/dL (ref 0.44–1.00)
GFR, Estimated: >60 mL/min (ref >60)
Glucose, Bld: 125 mg/dL — ABNORMAL HIGH (ref 70–99)
Potassium: 3.6 mmol/L (ref 3.5–5.1)
Sodium: 139 mmol/L (ref 135–145)

## 2021-10-17 ENCOUNTER — Ambulatory Visit (HOSPITAL_COMMUNITY)
Admission: RE | Admit: 2021-10-17 | Discharge: 2021-10-17 | Disposition: A | Discharge status: Home / Self Care | Payer: Medicare Other | Attending: Gastroenterology | Admitting: Gastroenterology

## 2021-10-17 ENCOUNTER — Encounter (HOSPITAL_COMMUNITY)
Admission: RE | Disposition: A | Discharge status: Home / Self Care | Payer: Self-pay | Source: Home / Self Care | Attending: Gastroenterology

## 2021-10-17 ENCOUNTER — Ambulatory Visit (HOSPITAL_BASED_OUTPATIENT_CLINIC_OR_DEPARTMENT_OTHER): Payer: Medicare Other | Admitting: Certified Registered"

## 2021-10-17 ENCOUNTER — Ambulatory Visit (HOSPITAL_COMMUNITY): Payer: Medicare Other | Admitting: Certified Registered"

## 2021-10-17 ENCOUNTER — Encounter (HOSPITAL_COMMUNITY): Payer: Self-pay | Admitting: Gastroenterology

## 2021-10-17 DIAGNOSIS — K3189 Other diseases of stomach and duodenum: Secondary | ICD-10-CM

## 2021-10-17 DIAGNOSIS — K449 Diaphragmatic hernia without obstruction or gangrene: Secondary | ICD-10-CM

## 2021-10-17 DIAGNOSIS — K264 Chronic or unspecified duodenal ulcer with hemorrhage: Secondary | ICD-10-CM | POA: Diagnosis not present

## 2021-10-17 DIAGNOSIS — K263 Acute duodenal ulcer without hemorrhage or perforation: Secondary | ICD-10-CM

## 2021-10-17 DIAGNOSIS — I10 Essential (primary) hypertension: Secondary | ICD-10-CM | POA: Diagnosis not present

## 2021-10-17 DIAGNOSIS — Z951 Presence of aortocoronary bypass graft: Secondary | ICD-10-CM | POA: Diagnosis not present

## 2021-10-17 DIAGNOSIS — F32A Depression, unspecified: Secondary | ICD-10-CM | POA: Insufficient documentation

## 2021-10-17 DIAGNOSIS — I2510 Atherosclerotic heart disease of native coronary artery without angina pectoris: Secondary | ICD-10-CM | POA: Insufficient documentation

## 2021-10-17 DIAGNOSIS — E119 Type 2 diabetes mellitus without complications: Secondary | ICD-10-CM | POA: Insufficient documentation

## 2021-10-17 DIAGNOSIS — K219 Gastro-esophageal reflux disease without esophagitis: Secondary | ICD-10-CM | POA: Insufficient documentation

## 2021-10-17 DIAGNOSIS — K315 Obstruction of duodenum: Secondary | ICD-10-CM

## 2021-10-17 HISTORY — PX: ESOPHAGOGASTRODUODENOSCOPY (EGD) WITH PROPOFOL: SHX5813

## 2021-10-17 LAB — GLUCOSE, CAPILLARY: Glucose-Capillary: 149 mg/dL — ABNORMAL HIGH (ref 70–99)

## 2021-10-17 SURGERY — ESOPHAGOGASTRODUODENOSCOPY (EGD) WITH PROPOFOL
Anesthesia: General

## 2021-10-17 MED ORDER — LIDOCAINE HCL (CARDIAC) PF 100 MG/5ML IV SOSY
PREFILLED_SYRINGE | INTRAVENOUS | Status: DC | PRN
  Administered 2021-10-17: 50 mg via INTRAVENOUS

## 2021-10-17 MED ORDER — LACTATED RINGERS IV SOLN: INTRAVENOUS | Status: DC

## 2021-10-17 MED ORDER — PROPOFOL 500 MG/50ML IV EMUL: INTRAVENOUS | Status: DC | PRN

## 2021-10-17 MED ORDER — PHENYLEPHRINE 80 MCG/ML (10ML) SYRINGE FOR IV PUSH (FOR BLOOD PRESSURE SUPPORT)
PREFILLED_SYRINGE | INTRAVENOUS | Status: AC
  Filled 2021-10-17: qty 10

## 2021-10-17 MED ORDER — PANTOPRAZOLE SODIUM 40 MG PO TBEC: 40.0000 mg | DELAYED_RELEASE_TABLET | Freq: Every day | ORAL | 3 refills | Status: DC

## 2021-10-17 MED ORDER — PROPOFOL 10 MG/ML IV BOLUS
INTRAVENOUS | Status: DC | PRN
  Administered 2021-10-17: 40 mg via INTRAVENOUS
  Administered 2021-10-17: 80 mg via INTRAVENOUS

## 2021-10-17 NOTE — Addendum Note (Signed)
Addendum  created 10/17/21 0805 by Julian Reil, CRNA  ? Intraprocedure Event edited  ?  ?

## 2021-10-17 NOTE — Anesthesia Procedure Notes (Signed)
Date/Time: 10/17/2021 7:24 AM ?Performed by: Julian Reil, CRNA ?Pre-anesthesia Checklist: Patient identified, Emergency Drugs available, Suction available and Patient being monitored ?Patient Re-evaluated:Patient Re-evaluated prior to induction ?Oxygen Delivery Method: Nasal cannula ?Induction Type: IV induction ?Placement Confirmation: positive ETCO2 ? ? ? ? ?

## 2021-10-17 NOTE — Transfer of Care (Signed)
Immediate Anesthesia Transfer of Care Note ? ?Patient: Virginia [name] ? ?Procedure(s) Performed: ESOPHAGOGASTRODUODENOSCOPY (EGD) WITH PROPOFOL ? ?Patient Location: Short Stay ? ?Anesthesia Type:General ? ?Level of Consciousness: drowsy ? ?Airway & Oxygen Therapy: Patient Spontanous Breathing and Patient connected to nasal cannula oxygen ? ?Post-op Assessment: Report given to RN and Post -op Vital signs reviewed and stable ? ?Post vital signs: Reviewed and stable ? ?Last Vitals:  ?Vitals Value Taken Time  ?BP 132/65 10/17/21 0735  ?Temp    ?Pulse 83 10/17/21 0734  ?Resp 18 10/17/21 0734  ?SpO2 100 % 10/17/21 0734  ? ? ?Last Pain:  ?Vitals:  ? 10/17/21 0734  ?TempSrc:   ?PainSc: 0-No pain  ?   ? ?Patients Stated Pain Goal: 4 (10/17/21 0715) ? ?Complications: No notable events documented. ?

## 2021-10-17 NOTE — Op Note (Signed)
[facility] ?Patient Name: Virginia [name] ?Procedure Date: 10/17/2021 7:04 AM ?MRN: [id number] ?Date of Birth: February 21, 1941 ?Attending MD: Katrinka Blazing ,  ?CSN: [id number] ?Age: 81 ?Admit Type: Outpatient ?Procedure:                Upper GI endoscopy ?Indications:              Follow-up of acute duodenal ulcer with obstruction ?Providers:                Katrinka Blazing, Edrick Kins, RN, Dyann Ruddle ?Referring MD:              ?Medicines:                Monitored Anesthesia Care ?Complications:             ?Estimated Blood Loss:     Estimated blood loss: none. ?Procedure:                Pre-Anesthesia Assessment: ?                          - Prior to the procedure, a History and Physical  ?                          was performed, and patient medications, allergies  ?                          and sensitivities were reviewed. The patient's  ?                          tolerance of previous anesthesia was reviewed. ?                          - The risks and benefits of the procedure and the  ?                          sedation options and risks were discussed with the  ?                          patient. All questions were answered and informed  ?                          consent was obtained. ?                          - ASA Grade Assessment: II - A patient with mild  ?                          systemic disease. ?                          After obtaining informed consent, the endoscope was  ?                          passed under direct vision. Throughout the  ?                          procedure, the patient's  blood pressure, pulse, and  ?                          oxygen saturations were monitored continuously. The  ?                          [id number]) scope was introduced through the  ?                          mouth, and advanced to the second part of duodenum.  ?                          The upper GI endoscopy was accomplished without  ?                          difficulty. The patient tolerated the  procedure  ?                          well. ?Scope In: [id number] AM ?Scope Out: 7:29:59 AM ?Total Procedure Duration: 0 hours 5 minutes 41 seconds  ?Findings: ?     A 3 cm hiatal hernia was present. Cameron ulcers have healed. ?     The entire examined stomach was normal. ?     A mild angulated deformity was found in the first portion (duodenal  ?     sweep) of the duodenum - this was at the site of previous ulcer which  ?     has healed. Scope could be advanced without problem. ?     The exam of the duodenum was otherwise normal. ?Impression:               - 3 cm hiatal hernia. ?                          - Normal stomach. ?                          - Duodenal deformity at duodenal sweep. ?                          - No specimens collected. ?Moderate Sedation: ?     Per Anesthesia Care ?Recommendation:           - Discharge patient to home (ambulatory). ?                          - Resume previous diet. ?                          - No ibuprofen, naproxen, or other non-steroidal  ?                          anti-inflammatory drugs. ?                          - Decrease pantorpazole to 40 mg every day. ?                          -  Restart Eliquis today. ?Procedure Code(s):        --- Professional --- ?                          [id number], Esophagogastroduodenoscopy, flexible,  ?                          transoral; diagnostic, including collection of  ?                          specimen(s) by brushing or washing, when performed  ?                          (separate procedure) ?Diagnosis Code(s):        --- Professional --- ?                          K44.9, Diaphragmatic hernia without obstruction or  ?                          gangrene ?                          K31.89, Other diseases of stomach and duodenum ?                          K26.3, Acute duodenal ulcer without hemorrhage or  ?                          perforation ?CPT copyright 2019 American Medical Association. All rights reserved. ?The codes documented in this  report are preliminary and upon coder review may  ?be revised to meet current compliance requirements. ?Maylon Peppers, MD ?Maylon Peppers,  ?10/17/2021 7:38:05 AM ?This report has been signed electronically. ?Number of Addenda: 0 ?

## 2021-10-17 NOTE — Discharge Instructions (Signed)
You are being discharged to home.  ?Resume your previous diet.  ?Do not take any ibuprofen (including Advil, Motrin or Nuprin), naproxen, or other non-steroidal anti-inflammatory drugs.  ?Decrease pantorpazole to 40 mg every day. ?Restart Eliquis today. ?

## 2021-10-17 NOTE — H&P (Signed)
Virginia [name] is an 81 y.o. female.   ?Chief Complaint: follow up duodenal ulcer and stenosis  ?HPI:  81 y.o. female with history of coronary artery disease status post CABG, hyperlipidemia, A-fib, hypertension, renal artery stenosis and type 2 diabetes, coming for follow-up of duodenal ulcer and stenosis. ? ?Patient was admitted 2 months ago after presenting an episode of melena and lightheadedness.  She was found to have severe anemia.  Esophagogastroduodenospy showed presence of a duodenal ulcer with associated stenosis that only allow passage of the ultraslim scope.  Ulcer was not bleeding.  She was given PPI twice daily which she has been taking.  She was advised to avoid using Aleve. ? ?Past Medical History:  ?Diagnosis Date  ? Coronary artery disease   ? status post coronary artery bypass grafting x3 in 1999  ? GERD (gastroesophageal reflux disease)   ? Hyperlipidemia   ? Hypertension   ? Renal artery stenosis ([place])   ? S/P CABG (coronary artery bypass graft) 06/18/1997  ? x3  ? Type 2 diabetes mellitus ([place])   ? ? ?Past Surgical History:  ?Procedure Laterality Date  ? BIOPSY  08/19/2021  ? Procedure: BIOPSY;  Surgeon: Harvel Quale, MD;  Location: [facility];  Service: Gastroenterology;;  ? CARDIAC CATHETERIZATION  04/17/2007  ? L main 50-60% ostital stenosis, patent LAD, 90% stenosis at L Cfx, dominant RCA with 60-70% segmental stenosis, patent LIMA-LAD, patent free LIMA-OM, patent VG-PDA (Dr. Adora Fridge)  ? CARDIAC CATHETERIZATION  03/02/2003  ? L main with 50% osital stenosis, LAD totally occluded after 2nd diagonal, L Cfx with 90% ostial OM, RCA with 70-80% segmental stenosis, patent LIMA-LAD, patent free RIMA-Cfx marginal, patent VG to PDA (Dr. Adora Fridge)  ? CARDIAC CATHETERIZATION  05/18/1998  ? significant ostial Cfx & OM & mid LAD disease - subsequent CABG (Dr. Adora Fridge)  ? CAROTID DOPPLER  03/2006  ? right bulb with 0-49% diameter reduction  ? CORONARY ARTERY BYPASS GRAFT  05/19/1998  ?  LIMA to LAD, free RIMA to OM, VG to distal RCA  ? ESOPHAGOGASTRODUODENOSCOPY (EGD) WITH PROPOFOL N/A 08/19/2021  ? Procedure: ESOPHAGOGASTRODUODENOSCOPY (EGD) WITH PROPOFOL;  Surgeon: Harvel Quale, MD;  Location: [facility];  Service: Gastroenterology;  Laterality: N/A;  ? NM MYOCAR PERF WALL MOTION  04/2011  ? lexiscan - fixed paical and basal-mid lateral defect, no reversible ischemia; EF 58%; PVCs noted; low risk  ? RENAL DOPPLER  09/2012  ? right renal artery 60-99% diameter reduction, left renal artery 1-59% diameter reduction  ? TOTAL KNEE ARTHROPLASTY Right   ? Dr. Luna Glasgow  ? ? ?Family History  ?Problem Relation Age of Onset  ? Stroke Mother   ? Heart attack Son   ? ?Social History:  reports that she has never smoked. She has never used smokeless tobacco. She reports that she does not drink alcohol and does not use drugs. ? ?Allergies: No Known Allergies ? ?Medications Prior to Admission  ?Medication Sig Dispense Refill  ? acetaminophen (TYLENOL) 500 MG tablet Take 500-1,000 mg by mouth every 6 (six) hours as needed (for pain.).    ? atorvastatin (LIPITOR) 80 MG tablet Take 1 tablet (80 mg total) by mouth daily. 90 tablet 3  ? ELIQUIS 5 MG TABS tablet TAKE ONE (1) TABLET BY MOUTH TWO (2) TIMES DAILY 180 tablet 0  ? ferrous sulfate 325 (65 FE) MG tablet Take 650 mg by mouth at bedtime.    ? HYDROcodone-acetaminophen (NORCO/VICODIN) 5-325 MG tablet  Take 1 tablet by mouth every 8 (eight) hours as needed (pain.).    ? metoprolol succinate (TOPROL-XL) 50 MG 24 hr tablet Take 1.5 tablets (75 mg total) by mouth daily. Take with or immediately following a meal. 90 tablet 1  ? pantoprazole (PROTONIX) 40 MG tablet Take 1 tablet (40 mg total) by mouth 2 (two) times daily. 60 tablet 1  ? PARoxetine (PAXIL) 10 MG tablet Take 1 tablet (10 mg total) by mouth every morning. 90 tablet 1  ? ? ?Results for orders placed or performed during the hospital encounter of 10/17/21 (from the past 48 hour(s))  ?Glucose,  capillary     Status: Abnormal  ? Collection Time: 10/17/21  7:07 AM  ?Result Value Ref Range  ? Glucose-Capillary 149 (H) 70 - 99 mg/dL  ?  Comment: Glucose reference range applies only to samples taken after fasting for at least 8 hours.  ? ?No results found. ? ?Review of Systems  ?Constitutional: Negative.   ?HENT: Negative.    ?Eyes: Negative.   ?Respiratory: Negative.    ?Cardiovascular: Negative.   ?Gastrointestinal: Negative.   ?Endocrine: Negative.   ?Genitourinary: Negative.   ?Musculoskeletal: Negative.   ?Skin: Negative.   ?Allergic/Immunologic: Negative.   ?Neurological: Negative.   ?Hematological: Negative.   ?Psychiatric/Behavioral: Negative.    ? ?Blood pressure (!) 171/108, pulse 79, temperature 98 ?F (36.7 ?C), temperature source Oral, resp. rate 20, height 5\' 5"  (1.651 m), weight 68 kg, SpO2 94 %. ?Physical Exam  ?GENERAL: The patient is AO x3, in no acute distress. ?HEENT: Head is normocephalic and atraumatic. EOMI are intact. Mouth is well hydrated and without lesions. ?NECK: Supple. No masses ?LUNGS: Clear to auscultation. No presence of rhonchi/wheezing/rales. Adequate chest expansion ?HEART: RRR, normal s1 and s2. ?ABDOMEN: Soft, nontender, no guarding, no peritoneal signs, and nondistended. BS +. No masses. ?EXTREMITIES: Without any cyanosis, clubbing, rash, lesions or edema. ?NEUROLOGIC: AOx3, no focal motor deficit. ?SKIN: no jaundice, no rashes ? ?Assessment/Plan ?81 y.o. female with history of coronary artery disease status post CABG, hyperlipidemia, A-fib, hypertension, renal artery stenosis and type 2 diabetes, coming for follow-up of duodenal ulcer and stenosis.  We will proceed with EGD. ? ?Harvel Quale, MD ?10/17/2021, 7:19 AM ? ? ? ?

## 2021-10-17 NOTE — Anesthesia Preprocedure Evaluation (Signed)
Anesthesia Evaluation  ?Patient identified by MRN, date of birth, ID band ?Patient awake ? ? ? ?Reviewed: ?Allergy & Precautions, H&P , NPO status , Patient's Chart, lab work & pertinent test results, reviewed documented beta blocker date and time  ? ?Airway ?Mallampati: II ? ?TM Distance: >3 FB ?Neck ROM: full ? ? ? Dental ?no notable dental hx. ? ?  ?Pulmonary ?neg pulmonary ROS,  ?  ?Pulmonary exam normal ?breath sounds clear to auscultation ? ? ? ? ? ? Cardiovascular ?Exercise Tolerance: Good ?hypertension, + CAD, + CABG and + Peripheral Vascular Disease  ? ?Rhythm:regular Rate:Normal ? ? ?  ?Neuro/Psych ?PSYCHIATRIC DISORDERS Depression negative neurological ROS ?   ? GI/Hepatic ?Neg liver ROS, GERD  Medicated,  ?Endo/Other  ?negative endocrine ROSdiabetes ? Renal/GU ?negative Renal ROS  ?negative genitourinary ?  ?Musculoskeletal ? ? Abdominal ?  ?Peds ? Hematology ? ?(+) Blood dyscrasia, anemia ,   ?Anesthesia Other Findings ? ? Reproductive/Obstetrics ?negative OB ROS ? ?  ? ? ? ? ? ? ? ? ? ? ? ? ? ?  ?  ? ? ? ? ? ? ? ? ?Anesthesia Physical ?Anesthesia Plan ? ?ASA: 3 ? ?Anesthesia Plan: General  ? ?Post-op Pain Management:   ? ?Induction:  ? ?PONV Risk Score and Plan: Propofol infusion ? ?Airway Management Planned:  ? ?Additional Equipment:  ? ?Intra-op Plan:  ? ?Post-operative Plan:  ? ?Informed Consent: I have reviewed the patients History and Physical, chart, labs and discussed the procedure including the risks, benefits and alternatives for the proposed anesthesia with the patient or authorized representative who has indicated his/her understanding and acceptance.  ? ? ? ?Dental Advisory Given ? ?Plan Discussed with: CRNA ? ?Anesthesia Plan Comments:   ? ? ? ? ? ? ?Anesthesia Quick Evaluation ? ?

## 2021-10-17 NOTE — Anesthesia Postprocedure Evaluation (Signed)
Anesthesia Post Note ? ?Patient: Virginia [name] ? ?Procedure(s) Performed: ESOPHAGOGASTRODUODENOSCOPY (EGD) WITH PROPOFOL ? ?Patient location during evaluation: Phase II ?Anesthesia Type: General ?Level of consciousness: awake ?Pain management: pain level controlled ?Vital Signs Assessment: post-procedure vital signs reviewed and stable ?Respiratory status: spontaneous breathing and respiratory function stable ?Cardiovascular status: blood pressure returned to baseline and stable ?Postop Assessment: no headache and no apparent nausea or vomiting ?Anesthetic complications: no ?Comments: Late entry ? ? ?No notable events documented. ? ? ?Last Vitals:  ?Vitals:  ? 10/17/21 0734 10/17/21 0735  ?BP:  132/65  ?Pulse: 83   ?Resp: 18   ?Temp:    ?SpO2: 100%   ?  ?Last Pain:  ?Vitals:  ? 10/17/21 0734  ?TempSrc:   ?PainSc: 0-No pain  ? ? ?  ?  ?  ?  ?  ?  ? ?[place]ell [place] ? ? ? ? ?

## 2021-10-25 ENCOUNTER — Encounter (HOSPITAL_COMMUNITY): Payer: Self-pay | Admitting: Gastroenterology

## 2021-10-27 ENCOUNTER — Other Ambulatory Visit: Payer: Self-pay | Admitting: *Deleted

## 2021-10-27 DIAGNOSIS — I482 Chronic atrial fibrillation, unspecified: Secondary | ICD-10-CM

## 2021-10-27 DIAGNOSIS — I2510 Atherosclerotic heart disease of native coronary artery without angina pectoris: Secondary | ICD-10-CM

## 2021-10-27 DIAGNOSIS — I480 Paroxysmal atrial fibrillation: Secondary | ICD-10-CM

## 2021-10-27 MED ORDER — PAROXETINE HCL 10 MG PO TABS
10.0000 mg | ORAL_TABLET | Freq: Every morning | ORAL | 0 refills | Status: DC
Start: 2021-10-27 — End: 2022-01-10

## 2021-10-27 MED ORDER — APIXABAN 5 MG PO TABS: ORAL_TABLET | ORAL | 0 refills | Status: DC

## 2021-10-27 MED ORDER — METOPROLOL SUCCINATE ER 50 MG PO TB24: 75.0000 mg | ORAL_TABLET | Freq: Every day | ORAL | 0 refills | Status: DC

## 2021-10-27 MED ORDER — ATORVASTATIN CALCIUM 80 MG PO TABS: 80.0000 mg | ORAL_TABLET | Freq: Every day | ORAL | 0 refills | Status: DC

## 2021-10-31 ENCOUNTER — Other Ambulatory Visit: Payer: Self-pay | Admitting: *Deleted

## 2021-11-01 ENCOUNTER — Other Ambulatory Visit: Payer: Self-pay | Admitting: Family

## 2021-11-01 ENCOUNTER — Telehealth: Payer: Self-pay | Admitting: Family

## 2021-11-01 DIAGNOSIS — I2510 Atherosclerotic heart disease of native coronary artery without angina pectoris: Secondary | ICD-10-CM

## 2021-11-01 DIAGNOSIS — I482 Chronic atrial fibrillation, unspecified: Secondary | ICD-10-CM

## 2021-11-01 NOTE — Telephone Encounter (Signed)
Bryan called for pt--please cancel metoprolol succinate (TOPROL-XL) 50 MG 24 hr tablet with mail order and send to rx to [facility]. ?

## 2021-11-02 MED ORDER — METOPROLOL SUCCINATE ER 50 MG PO TB24: 75.0000 mg | ORAL_TABLET | Freq: Every day | ORAL | 0 refills | Status: DC

## 2021-11-02 NOTE — Telephone Encounter (Signed)
[facility] in [place].

## 2021-11-07 ENCOUNTER — Telehealth: Payer: Self-pay | Admitting: Family

## 2021-11-07 NOTE — Telephone Encounter (Signed)
I spoke to pt's son and advised it doesn't look like we prescribe her pain medicine. The son will look at the rx bottle when they get home to see who she gets it from.

## 2021-11-14 ENCOUNTER — Other Ambulatory Visit: Payer: Self-pay | Admitting: Family

## 2021-11-17 DIAGNOSIS — H10013 Acute follicular conjunctivitis, bilateral: Secondary | ICD-10-CM | POA: Diagnosis not present

## 2021-11-20 ENCOUNTER — Ambulatory Visit (INDEPENDENT_AMBULATORY_CARE_PROVIDER_SITE_OTHER): Payer: Medicare Other

## 2021-11-20 ENCOUNTER — Encounter: Payer: Self-pay | Admitting: Nurse Practitioner

## 2021-11-20 ENCOUNTER — Ambulatory Visit (INDEPENDENT_AMBULATORY_CARE_PROVIDER_SITE_OTHER): Payer: Medicare Other | Admitting: Nurse Practitioner

## 2021-11-20 ENCOUNTER — Other Ambulatory Visit: Payer: Self-pay | Admitting: *Deleted

## 2021-11-20 VITALS — BP 142/82 | HR 78 | Temp 97.1°F | Resp 20 | Ht 65.0 in | Wt 153.0 lb

## 2021-11-20 DIAGNOSIS — B372 Candidiasis of skin and nail: Secondary | ICD-10-CM

## 2021-11-20 DIAGNOSIS — M25552 Pain in left hip: Secondary | ICD-10-CM

## 2021-11-20 DIAGNOSIS — R079 Chest pain, unspecified: Secondary | ICD-10-CM | POA: Diagnosis not present

## 2021-11-20 DIAGNOSIS — R0781 Pleurodynia: Secondary | ICD-10-CM | POA: Diagnosis not present

## 2021-11-20 DIAGNOSIS — I10 Essential (primary) hypertension: Secondary | ICD-10-CM

## 2021-11-20 DIAGNOSIS — M1612 Unilateral primary osteoarthritis, left hip: Secondary | ICD-10-CM | POA: Diagnosis not present

## 2021-11-20 MED ORDER — LISINOPRIL 20 MG PO TABS: 20.0000 mg | ORAL_TABLET | Freq: Every day | ORAL | 3 refills | Status: DC

## 2021-11-20 MED ORDER — NYSTATIN 100000 UNIT/GM EX CREA: 1.0000 "application " | TOPICAL_CREAM | Freq: Two times a day (BID) | CUTANEOUS | 0 refills | Status: DC

## 2021-11-20 NOTE — Progress Notes (Addendum)
   Subjective:    Patient ID: Virginia [name], female    DOB: May 18, 1941, 81 y.o.   MRN: [id number]   Chief Complaint: BP high at home and Shortness of Breath   HPI Patient comes in today with 2 complaints: - blood pressure elevated  at home. The systolic has been over 170. - Fall- she fell on 11/16/21 and landed on her left side. She is having left flank pain as well as pain on ambulation and weight bearing. Rates pain 8/10 with movement and weight bearing increasing pain. Has pain when she takes a deep breathe. She denies any actual hip pain. She had hydrocodone at home and she has taken all of those.    Review of Systems  Genitourinary:  Positive for flank pain.      Objective:   Physical Exam Vitals reviewed.  Constitutional:      Appearance: She is well-developed.  Cardiovascular:     Rate and Rhythm: Normal rate and regular rhythm.     Heart sounds: Normal heart sounds.  Pulmonary:     Effort: Pulmonary effort is normal.     Breath sounds: Normal breath sounds.  Musculoskeletal:     Comments: FROM of left hip without pain.  Skin:    General: Skin is warm.     Findings: Rash (erythematous moist rash abdominal fold in center and slightly to the right) present.     Comments:  contusions  Neurological:     Mental Status: She is alert.    BP (!) 142/82   Pulse 78   Temp (!) 97.1 F (36.2 C) (Temporal)   Resp 20   Ht 5\' 5"  (1.651 m)   Wt 153 lb (69.4 kg)   SpO2 93%   BMI 25.46 kg/m   Rib xray- normal-Preliminary reading by , FNP  [name] Left hip no fracture-Preliminary reading by [facility], FNP  [facility]       Assessment & Plan:  [facility] in today with chief complaint of BP high at home and Shortness of Breath   1. Left-sided chest pain Ice Rest Cough and deep breath every 2 hours to prevent pneumonia Tylenol as needed for pain - DG Ribs Unilateral W/Chest Left  2. Left hip pain Fall prevention - DG HIP UNILAT W OR W/O PELVIS 2-3 VIEWS  LEFT  3. Cutaneous candidiasis Dry area with hair dryer before applying cream Avoid rubbing or scratching  Meds ordered this encounter  Medications   nystatin cream (MYCOSTATIN)    Sig: Apply 1 application. topically 2 (two) times daily.    Dispense:  30 g    Refill:  0    Order Specific Question:   Supervising Provider    Answer:   Criss Rosales A [[id number]]   4. Hypertenison Started on lisinopril 20mg  1 po daily Keep diary of blood pressure.  The above assessment and management plan was discussed with the patient. The patient verbalized understanding of and has agreed to the management plan. Patient is aware to call the clinic if symptoms persist or worsen. Patient is aware when to return to the clinic for a follow-up visit. Patient educated on when it is appropriate to go to the emergency department.   [facility], FNP

## 2021-11-20 NOTE — Progress Notes (Deleted)
   Acute Office Visit  Subjective:     Patient ID: Virginia [name], female    DOB: 07-25-1940, 81 y.o.   MRN: [id number]  Chief Complaint  Patient presents with   BP high at home   Shortness of Breath    HPI Patient is in today for ***  ROS      Objective:    BP (!) 142/82   Pulse 78   Temp (!) 97.1 F (36.2 C) (Temporal)   Resp 20   Ht 5\' 5"  (1.651 m)   Wt 153 lb (69.4 kg)   SpO2 93%   BMI 25.46 kg/m  {Vitals History (Optional):23777}  Physical Exam  No results found for any visits on 11/20/21.      Assessment & Plan:   Problem List Items Addressed This Visit   None Visit Diagnoses     Left-sided chest pain    -  Primary   Relevant Orders   DG Ribs Unilateral W/Chest Left   Left hip pain       Relevant Orders   DG HIP UNILAT W OR W/O PELVIS 2-3 VIEWS LEFT   Cutaneous candidiasis           No orders of the defined types were placed in this encounter.   No follow-ups on file.  Mary-Margaret 01/20/22, FNP

## 2021-11-20 NOTE — Patient Instructions (Signed)
Flank Pain, Adult ?Flank pain is pain that is located on the side of the body between the upper abdomen and the spine. This area is called the flank. The pain may occur over a short period of time (acute), or it may be long-term or recurring (chronic). It may be mild or severe. Flank pain can be caused by many things, including: ?Muscle soreness or injury. ?Kidney infection, kidney stones, or kidney disease. ?Stress. ?A disease of the spine (vertebral disk disease). ?A lung infection (pneumonia). ?Fluid around the lungs (pulmonary edema). ?A skin rash caused by the chickenpox virus (shingles). ?Tumors that affect the back of the abdomen. ?Gallbladder disease. ?Follow these instructions at home: ? ?Drink enough fluid to keep your urine pale yellow. ?Rest as told by your health care provider. ?Take over-the-counter and prescription medicines only as told by your health care provider. ?Keep a journal to track what has caused your flank pain and what has made it feel better. ?Keep all follow-up visits. This is important. ?Contact a health care provider if: ?Your pain is not controlled with medicine. ?You have new symptoms. ?Your pain gets worse. ?Your symptoms last longer than 2-3 days. ?You have trouble urinating or you are urinating very frequently. ?Get help right away if: ?You have trouble breathing or you are short of breath. ?Your abdomen hurts or it is swollen or red. ?You have nausea or vomiting. ?You feel faint, or you faint. ?You have blood in your urine. ?You have flank pain and a fever. ?These symptoms may represent a serious problem that is an emergency. Do not wait to see if the symptoms will go away. Get medical help right away. Call your local emergency services (911 in the [place] Do not drive yourself to the hospital. ?Summary ?Flank pain is pain that is located on the side of the body between the upper abdomen and the spine. ?The pain may occur over a short period of time (acute), or it may be  long-term or recurring (chronic). It may be mild or severe. ?Flank pain can be caused by many things. ?Contact your health care provider if your symptoms get worse or last longer than 2-3 days. ?This information is not intended to replace advice given to you by your health care provider. Make sure you discuss any questions you have with your health care provider. ?Document Revised: 08/15/2020 Document Reviewed: 08/15/2020 ?Elsevier Patient Education ? 2023 Elsevier Inc. ? ?

## 2021-11-20 NOTE — Addendum Note (Signed)
Addended by: Chevis Pretty on: 11/20/2021 11:26 AM   Modules accepted: Orders

## 2021-11-30 ENCOUNTER — Ambulatory Visit: Payer: Medicare Other | Admitting: Orthopaedic Surgery

## 2021-11-30 ENCOUNTER — Ambulatory Visit (INDEPENDENT_AMBULATORY_CARE_PROVIDER_SITE_OTHER): Payer: Medicare Other

## 2021-11-30 ENCOUNTER — Encounter: Payer: Self-pay | Admitting: Orthopaedic Surgery

## 2021-11-30 VITALS — BP 164/87 | HR 93 | Ht 65.0 in | Wt 153.0 lb

## 2021-11-30 DIAGNOSIS — M25562 Pain in left knee: Secondary | ICD-10-CM

## 2021-11-30 DIAGNOSIS — M1712 Unilateral primary osteoarthritis, left knee: Secondary | ICD-10-CM

## 2021-11-30 NOTE — Progress Notes (Signed)
My knee hurts.  She fell two weeks ago and hurt her left knee.  She has long standing arthritis of the left knee.  The pain has not improved.  She has some swelling but no giving way, no redness.  ROM of the left knee is 0 to 100 with lateral pain, crepitus, slight effusion, knock-knee deformity is present, NV intact, slight bilateral edema.  X-rays were done of the left knee, reported separately.  She has severe DJD with lateral bone on bone.  Encounter Diagnosis  Name Primary?   Acute pain of left knee Yes   PROCEDURE NOTE:  The patient requests injections of the left knee , verbal consent was obtained.  The left knee was prepped appropriately after time out was performed.   Sterile technique was observed and injection of 1 cc of DepoMedrol 40 mg with several cc's of plain xylocaine. Anesthesia was provided by ethyl chloride and a 20-gauge needle was used to inject the knee area. The injection was tolerated well.  A band aid dressing was applied.  The patient was advised to apply ice later today and tomorrow to the injection sight as needed.  I have told her she is candidate for total knee.  She has rejected this in the past.  Return in one month.  Use Aspercreme, Biofreeze or Voltaren gel.  Call if any problem.  Precautions discussed.  Electronically Signed Darreld Mclean, MD 6/15/20239:53 AM

## 2021-12-07 ENCOUNTER — Ambulatory Visit (INDEPENDENT_AMBULATORY_CARE_PROVIDER_SITE_OTHER): Payer: Medicare Other | Admitting: Family Medicine

## 2021-12-07 ENCOUNTER — Encounter: Payer: Self-pay | Admitting: Family Medicine

## 2021-12-07 VITALS — BP 178/92 | HR 107 | Temp 98.6°F | Ht 65.0 in | Wt 152.0 lb

## 2021-12-07 DIAGNOSIS — K921 Melena: Secondary | ICD-10-CM | POA: Diagnosis not present

## 2021-12-07 LAB — CBC WITH DIFFERENTIAL/PLATELET
Basophils Absolute: 0 10*3/uL (ref 0.0–0.2)
Basos: 0 %
EOS (ABSOLUTE): 0.1 10*3/uL (ref 0.0–0.4)
Eos: 2 %
Hematocrit: 37.9 % (ref 34.0–46.6)
Hemoglobin: 12.5 g/dL (ref 11.1–15.9)
Immature Grans (Abs): 0 10*3/uL (ref 0.0–0.1)
Immature Granulocytes: 0 %
Lymphocytes Absolute: 1.8 10*3/uL (ref 0.7–3.1)
Lymphs: 33 %
MCH: 28.5 pg (ref 26.6–33.0)
MCHC: 33 g/dL (ref 31.5–35.7)
MCV: 86 fL (ref 79–97)
Monocytes Absolute: 0.6 10*3/uL (ref 0.1–0.9)
Monocytes: 11 %
Neutrophils Absolute: 2.9 10*3/uL (ref 1.4–7.0)
Neutrophils: 54 %
Platelets: 192 10*3/uL (ref 150–450)
RBC: 4.39 x10E6/uL (ref 3.77–5.28)
RDW: 15.8 % — ABNORMAL HIGH (ref 11.7–15.4)
WBC: 5.5 10*3/uL (ref 3.4–10.8)

## 2021-12-07 NOTE — Progress Notes (Signed)
BP (!) 178/92   Pulse (!) 107   Temp 98.6 F (37 C)   Ht 5\' 5"  (1.651 m)   Wt 152 lb (68.9 kg)   SpO2 96%   BMI 25.29 kg/m    Subjective:   Patient ID: Virginia [name], female    DOB: 09/29/1940, 81 y.o.   MRN: [id number]  HPI: Virginia [name] is a 81 y.o. female presenting on 12/07/2021 for Blood In Stools   HPI Patient is coming in with complaint of blood in her stool.  2 days ago she noticed that she had 1 bowel movement with bright red blood in her stool.  She says she has not had any since that day but just was concerned because she has had a bleeding ulcer earlier in this year and had lost a lot of blood and there were concerned that she could be having that direction again.  She denies any abdominal pain or rectal pain or nausea or vomiting.  She cannot recall if she had other symptoms before when she had the GI bleed.  Relevant past medical, surgical, family and social history reviewed and updated as indicated. Interim medical history since our last visit reviewed. Allergies and medications reviewed and updated.  Review of Systems  Constitutional:  Negative for chills and fever.  Eyes:  Negative for visual disturbance.  Respiratory:  Negative for chest tightness and shortness of breath.   Cardiovascular:  Negative for chest pain and leg swelling.  Gastrointestinal:  Positive for blood in stool. Negative for abdominal distention, abdominal pain, anal bleeding, constipation and diarrhea.  Musculoskeletal:  Negative for back pain and gait problem.  Skin:  Negative for rash.  Neurological:  Negative for light-headedness and headaches.  Psychiatric/Behavioral:  Negative for agitation and behavioral problems.   All other systems reviewed and are negative.   Per HPI unless specifically indicated above   Allergies as of 12/07/2021   No Known Allergies      Medication List        Accurate as of December 07, 2021  3:16 PM. If you have any questions, ask your nurse or  doctor.          acetaminophen 500 MG tablet Commonly known as: TYLENOL Take 500-1,000 mg by mouth every 6 (six) hours as needed (for pain.).   apixaban 5 MG Tabs tablet Commonly known as: Eliquis TAKE ONE (1) TABLET BY MOUTH TWO (2) TIMES DAILY   atorvastatin 80 MG tablet Commonly known as: LIPITOR Take 1 tablet (80 mg total) by mouth daily.   ferrous sulfate 325 (65 FE) MG tablet Take 650 mg by mouth at bedtime.   HYDROcodone-acetaminophen 5-325 MG tablet Commonly known as: NORCO/VICODIN Take 1 tablet by mouth every 8 (eight) hours as needed (pain.).   lisinopril 20 MG tablet Commonly known as: ZESTRIL Take 1 tablet (20 mg total) by mouth daily.   metoprolol succinate 50 MG 24 hr tablet Commonly known as: TOPROL-XL Take 1.5 tablets (75 mg total) by mouth daily. Take with or immediately following a meal.   nystatin cream Commonly known as: MYCOSTATIN Apply 1 application. topically 2 (two) times daily.   pantoprazole 40 MG tablet Commonly known as: PROTONIX Take 1 tablet (40 mg total) by mouth daily.   PARoxetine 10 MG tablet Commonly known as: PAXIL Take 1 tablet (10 mg total) by mouth every morning.         Objective:   BP (!) 178/92   Pulse (!) 107  Temp 98.6 F (37 C)   Ht 5\' 5"  (1.651 m)   Wt 152 lb (68.9 kg)   SpO2 96%   BMI 25.29 kg/m   Wt Readings from Last 3 Encounters:  12/07/21 152 lb (68.9 kg)  11/30/21 153 lb (69.4 kg)  11/20/21 153 lb (69.4 kg)    Physical Exam Vitals and nursing note reviewed.  Constitutional:      General: She is not in acute distress.    Appearance: She is well-developed. She is not diaphoretic.  Eyes:     Conjunctiva/sclera: Conjunctivae normal.  Genitourinary:    Rectum: External hemorrhoid and internal hemorrhoid present. No tenderness or anal fissure.  Skin:    General: Skin is warm and dry.     Findings: No rash.  Neurological:     Mental Status: She is alert and oriented to person, place, and time.      Coordination: Coordination normal.  Psychiatric:        Behavior: Behavior normal.       Assessment & Plan:   Problem List Items Addressed This Visit   None Visit Diagnoses     Hematochezia    -  Primary   Relevant Orders   CBC with Differential/Platelet   Fecal occult blood, imunochemical([facility])       Patient does have a hemorrhoid and recommended that if she continues to bleed to use a hemorrhoid cream over-the-counter but we will test her blood counts today and do a stool card that she can bring back and will monitor Follow up plan: Return if symptoms worsen or fail to improve.  Counseling provided for all of the vaccine components Orders Placed This Encounter  Procedures   Fecal occult blood, imunochemical([facility])   CBC with Differential/Platelet    01/20/22, MD [facility] 12/07/2021, 3:16 PM

## 2021-12-13 ENCOUNTER — Telehealth: Payer: Self-pay | Admitting: Family

## 2021-12-13 NOTE — Telephone Encounter (Signed)
Spoke with Patients sister.( Listed on DPR) She is aware of labs.

## 2021-12-13 NOTE — Telephone Encounter (Signed)
Patient's sister would like to talk to nurse about lab work. Aware that patient already spoke with nurse. Please call back.

## 2021-12-15 ENCOUNTER — Other Ambulatory Visit: Payer: Self-pay | Admitting: *Deleted

## 2021-12-27 ENCOUNTER — Ambulatory Visit: Payer: Medicare Other | Admitting: Orthopaedic Surgery

## 2021-12-27 ENCOUNTER — Encounter: Payer: Self-pay | Admitting: Orthopaedic Surgery

## 2021-12-27 VITALS — BP 129/76 | HR 70 | Ht 65.0 in | Wt 150.0 lb

## 2021-12-27 DIAGNOSIS — M25472 Effusion, left ankle: Secondary | ICD-10-CM | POA: Diagnosis not present

## 2021-12-27 DIAGNOSIS — M25474 Effusion, right foot: Secondary | ICD-10-CM

## 2021-12-27 DIAGNOSIS — M25475 Effusion, left foot: Secondary | ICD-10-CM

## 2021-12-27 DIAGNOSIS — M25471 Effusion, right ankle: Secondary | ICD-10-CM | POA: Diagnosis not present

## 2021-12-27 NOTE — Progress Notes (Signed)
My ankles are swollen.  She has developed swelling of both ankles with no trauma or falls.  She has recently been placed on Lisinopril for hypertension.  She says the feet started swelling several weeks ago.  She has bilateral equal edema of both feet and ankles.  She has no redness.  NV intact, ROM is full.  Encounter Diagnosis  Name Primary?   Bilateral swelling of feet and ankles Yes   I told her that she is retaining fluids in the ankles and she will most likely need a diuretic added to her blood pressure medicine.  They will contact her family doctor for this.  I will see as needed.  Call if any problem.  Precautions discussed.  Electronically Signed Darreld Mclean, MD 7/12/20239:18 AM

## 2021-12-28 ENCOUNTER — Encounter: Payer: Self-pay | Admitting: Family Medicine

## 2021-12-28 ENCOUNTER — Ambulatory Visit (INDEPENDENT_AMBULATORY_CARE_PROVIDER_SITE_OTHER): Payer: Medicare Other | Admitting: Family Medicine

## 2021-12-28 ENCOUNTER — Other Ambulatory Visit: Payer: Self-pay | Admitting: Family

## 2021-12-28 VITALS — BP 129/82 | HR 90 | Temp 95.9°F

## 2021-12-28 DIAGNOSIS — R6889 Other general symptoms and signs: Secondary | ICD-10-CM | POA: Diagnosis not present

## 2021-12-28 DIAGNOSIS — I482 Chronic atrial fibrillation, unspecified: Secondary | ICD-10-CM

## 2021-12-28 DIAGNOSIS — R600 Localized edema: Secondary | ICD-10-CM | POA: Diagnosis not present

## 2021-12-28 DIAGNOSIS — I2510 Atherosclerotic heart disease of native coronary artery without angina pectoris: Secondary | ICD-10-CM

## 2021-12-28 MED ORDER — HYDROCHLOROTHIAZIDE 12.5 MG PO CAPS: 12.5000 mg | ORAL_CAPSULE | Freq: Every day | ORAL | 0 refills | Status: DC

## 2021-12-28 NOTE — Progress Notes (Signed)
Assessment & Plan:  1. Bilateral lower extremity edema Education provided on edema. Continue elevation and low salt diet. Encouraged compression hose. Started HCTZ. Labs to rule out other causes of edema.  - Anemia Profile B - CMP14+EGFR - TSH - Brain natriuretic peptide - hydrochlorothiazide (MICROZIDE) 12.5 MG capsule; Take 1 capsule (12.5 mg total) by mouth daily.  Dispense: 30 capsule; Refill: 0   Follow up plan: Return in about 4 weeks (around 01/25/2022) for Edema.  Hendricks Limes, MSN, APRN, FNP-C [facility]  Subjective:   Patient ID: Virginia [name], female    DOB: 1940-10-25, 81 y.o.   MRN: [id number]  HPI: Virginia [name] is a 81 y.o. female presenting on 12/28/2021 for Foot Swelling (Bilateral leg swelling that has been going on a few months and now are moving up bilateral legs. )  Patient is accompanied by her sister, Virginia [name], who she is okay with being present.  Patient reports bilateral foot and leg swelling for the past few months.  She does eat a low-salt diet.  She has been elevating her feet recently and has not found to decrease the swelling.  She does not wear compression hose.  She does not feel her swelling is better after being in the bed.  She has had accompanying shortness of breath since she has been swelling.  She saw her orthopedic yesterday who recommended she start on a diuretic.   ROS: Negative unless specifically indicated above in HPI.   Relevant past medical history reviewed and updated as indicated.   Allergies and medications reviewed and updated.   Current Outpatient Medications:    acetaminophen (TYLENOL) 500 MG tablet, Take 500-1,000 mg by mouth every 6 (six) hours as needed (for pain.)., Disp: , Rfl:    apixaban (ELIQUIS) 5 MG TABS tablet, TAKE ONE (1) TABLET BY MOUTH TWO (2) TIMES DAILY, Disp: 180 tablet, Rfl: 0   atorvastatin (LIPITOR) 80 MG tablet, Take 1 tablet (80 mg total) by mouth daily., Disp: 90 tablet, Rfl:  0   ferrous sulfate 325 (65 FE) MG tablet, Take 650 mg by mouth at bedtime., Disp: , Rfl:    HYDROcodone-acetaminophen (NORCO/VICODIN) 5-325 MG tablet, Take 1 tablet by mouth every 8 (eight) hours as needed (pain.)., Disp: , Rfl:    lisinopril (ZESTRIL) 20 MG tablet, Take 1 tablet (20 mg total) by mouth daily., Disp: 90 tablet, Rfl: 3   metoprolol succinate (TOPROL-XL) 50 MG 24 hr tablet, Take 1.5 tablets (75 mg total) by mouth daily. Take with or immediately following a meal., Disp: 135 tablet, Rfl: 0   nystatin cream (MYCOSTATIN), Apply 1 application. topically 2 (two) times daily., Disp: 30 g, Rfl: 0   pantoprazole (PROTONIX) 40 MG tablet, Take 1 tablet (40 mg total) by mouth daily., Disp: 90 tablet, Rfl: 3   PARoxetine (PAXIL) 10 MG tablet, Take 1 tablet (10 mg total) by mouth every morning., Disp: 90 tablet, Rfl: 0  No Known Allergies  Objective:   BP 129/82   [name] 90   Temp (!) 95.9 F (35.5 C) (Temporal)   SpO2 94%    Physical Exam Vitals reviewed.  Constitutional:      General: She is not in acute distress.    Appearance: Normal appearance. She is not ill-appearing, toxic-appearing or diaphoretic.  HENT:     Head: Normocephalic and atraumatic.  Eyes:     General: No scleral icterus.       Right eye: No discharge.  Left eye: No discharge.     Conjunctiva/sclera: Conjunctivae normal.  Cardiovascular:     Rate and Rhythm: Normal rate and regular rhythm.     Heart sounds: Normal heart sounds. No murmur heard.    No friction rub. No gallop.  Pulmonary:     Effort: Pulmonary effort is normal. No respiratory distress.     Breath sounds: Normal breath sounds. No stridor. No wheezing, rhonchi or rales.  Musculoskeletal:        General: Normal range of motion.     Cervical back: Normal range of motion.     Right lower leg: 2+ Edema present.     Left lower leg: 2+ Edema present.  Skin:    General: Skin is warm and dry.     Capillary Refill: Capillary refill takes less  than 2 seconds.  Neurological:     General: No focal deficit present.     Mental Status: She is alert and oriented to person, place, and time. Mental status is at baseline.  Psychiatric:        Mood and Affect: Mood normal.        Behavior: Behavior normal.        Thought Content: Thought content normal.        Judgment: Judgment normal.

## 2021-12-29 LAB — ANEMIA PROFILE B
Basophils Absolute: 0 10*3/uL (ref 0.0–0.2)
Basos: 1 %
EOS (ABSOLUTE): 0.1 10*3/uL (ref 0.0–0.4)
Eos: 2 %
Ferritin: 80 ng/mL (ref 15–150)
Folate: >20 ng/mL (ref >3.0)
Hematocrit: 39.1 % (ref 34.0–46.6)
Hemoglobin: 12.9 g/dL (ref 11.1–15.9)
Immature Grans (Abs): 0 10*3/uL (ref 0.0–0.1)
Immature Granulocytes: 0 %
Iron Saturation: 22 % (ref 15–55)
Iron: 62 ug/dL (ref 27–139)
Lymphocytes Absolute: 1.1 10*3/uL (ref 0.7–3.1)
Lymphs: 26 %
MCH: 29.7 pg (ref 26.6–33.0)
MCHC: 33 g/dL (ref 31.5–35.7)
MCV: 90 fL (ref 79–97)
Monocytes Absolute: 0.5 10*3/uL (ref 0.1–0.9)
Monocytes: 12 %
Neutrophils Absolute: 2.5 10*3/uL (ref 1.4–7.0)
Neutrophils: 59 %
Platelets: 143 10*3/uL — ABNORMAL LOW (ref 150–450)
RBC: 4.34 x10E6/uL (ref 3.77–5.28)
RDW: 15.3 % (ref 11.7–15.4)
Retic Ct Pct: 1.4 % (ref 0.6–2.6)
Total Iron Binding Capacity: 280 ug/dL (ref 250–450)
UIBC: 218 ug/dL (ref 118–369)
Vitamin B-12: 345 pg/mL (ref 232–1245)
WBC: 4.3 10*3/uL (ref 3.4–10.8)

## 2021-12-29 LAB — CMP14+EGFR
ALT: 25 IU/L (ref 0–32)
AST: 33 IU/L (ref 0–40)
Albumin/Globulin Ratio: 2.6 — ABNORMAL HIGH (ref 1.2–2.2)
Albumin: 4.1 g/dL (ref 3.8–4.8)
Alkaline Phosphatase: 109 IU/L (ref 44–121)
BUN/Creatinine Ratio: 13 (ref 12–28)
BUN: 11 mg/dL (ref 8–27)
Bilirubin Total: 2.1 mg/dL — ABNORMAL HIGH (ref 0.0–1.2)
CO2: 22 mmol/L (ref 20–29)
Calcium: 10 mg/dL (ref 8.7–10.3)
Chloride: 103 mmol/L (ref 96–106)
Creatinine, Ser: 0.86 mg/dL (ref 0.57–1.00)
Globulin, Total: 1.6 g/dL (ref 1.5–4.5)
Glucose: 197 mg/dL — ABNORMAL HIGH (ref 70–99)
Potassium: 4.2 mmol/L (ref 3.5–5.2)
Sodium: 140 mmol/L (ref 134–144)
Total Protein: 5.7 g/dL — ABNORMAL LOW (ref 6.0–8.5)
eGFR: 68 mL/min/{1.73_m2} (ref >59)

## 2021-12-29 LAB — TSH: TSH: 2.34 u[IU]/mL (ref 0.450–4.500)

## 2021-12-29 LAB — BRAIN NATRIURETIC PEPTIDE: BNP: 533.2 pg/mL — ABNORMAL HIGH (ref 0.0–100.0)

## 2022-01-01 ENCOUNTER — Other Ambulatory Visit: Payer: Self-pay | Admitting: *Deleted

## 2022-01-01 DIAGNOSIS — R7989 Other specified abnormal findings of blood chemistry: Secondary | ICD-10-CM

## 2022-01-03 ENCOUNTER — Ambulatory Visit (INDEPENDENT_AMBULATORY_CARE_PROVIDER_SITE_OTHER): Payer: Medicare Other

## 2022-01-03 VITALS — Ht 65.0 in | Wt 150.0 lb

## 2022-01-03 DIAGNOSIS — Z0000 Encounter for general adult medical examination without abnormal findings: Secondary | ICD-10-CM

## 2022-01-03 NOTE — Patient Instructions (Addendum)
  Virginia [name] , Thank you for taking time to come for your Medicare Wellness Visit. I appreciate your ongoing commitment to your health goals. Please review the following plan we discussed and let me know if I can assist you in the future.   These are the goals we discussed:  Goals      DIET - INCREASE WATER INTAKE     Try to drink 6-8 glasses of water daily.        This is a list of the screening recommended for you and due dates:  Health Maintenance  Topic Date Due   Hemoglobin A1C  01/16/2022*   COVID-19 Vaccine (4 - Moderna series) 01/19/2022*   Tetanus Vaccine  03/18/2022*   Zoster (Shingles) Vaccine (1 of 2) 04/05/2022*   Flu Shot  01/16/2022   Complete foot exam   02/16/2022   Eye exam for diabetics  12/01/2022   Pneumonia Vaccine  Completed   DEXA scan (bone density measurement)  Completed   HPV Vaccine  Aged Out  *Topic was postponed. The date shown is not the original due date.     Virtual Visit via Telephone Note  Patient location: Home  Provider location: Office

## 2022-01-03 NOTE — Progress Notes (Signed)
Subjective:   Virginia [name] is a 81 y.o. female who presents for Medicare Annual (Subsequent) preventive examination.  Review of Systems    No ROS.  Medicare Wellness Virtual Visit.  Visual/audio telehealth visit, UTA vital signs.   See social history for additional risk factors.   Cardiac Risk Factors include: advanced age (>25men, >64 women);diabetes mellitus;hypertension     Objective:    Today's Vitals   01/03/22 1531  Weight: 150 lb (68 kg)  Height: 5\' 5"  (1.651 m)   Body mass index is 24.96 kg/m.     01/03/2022    3:44 PM 10/17/2021    7:16 AM 10/13/2021   10:12 AM 08/19/2021    9:34 AM 08/19/2021   12:35 AM 08/18/2021    3:46 PM 09/29/2019    9:24 AM  Advanced Directives  Does Patient Have a Medical Advance Directive? Yes No No No No No Yes  Type of Paramedic of [place];Living will      [place];Living will  Does patient want to make changes to medical advance directive? No - Patient declined      No - Patient declined  Copy of [place] in Chart? No - copy requested      No - copy requested  Would patient like information on creating a medical advance directive?  No - Patient declined No - Patient declined  No - Patient declined No - Patient declined     Current Medications (verified) Outpatient Encounter Medications as of 01/03/2022  Medication Sig   acetaminophen (TYLENOL) 500 MG tablet Take 500-1,000 mg by mouth every 6 (six) hours as needed (for pain.).   apixaban (ELIQUIS) 5 MG TABS tablet TAKE ONE (1) TABLET BY MOUTH TWO (2) TIMES DAILY   atorvastatin (LIPITOR) 80 MG tablet Take 1 tablet (80 mg total) by mouth daily.   ferrous sulfate 325 (65 FE) MG tablet Take 650 mg by mouth at bedtime.   hydrochlorothiazide (MICROZIDE) 12.5 MG capsule Take 1 capsule (12.5 mg total) by mouth daily.   HYDROcodone-acetaminophen (NORCO/VICODIN) 5-325 MG tablet Take 1 tablet by mouth every 8 (eight) hours as needed  (pain.). (Patient not taking: Reported on 01/03/2022)   lisinopril (ZESTRIL) 20 MG tablet Take 1 tablet (20 mg total) by mouth daily.   metoprolol succinate (TOPROL-XL) 50 MG 24 hr tablet TAKE 1 AND 1/2 TABLETS BY MOUTH  DAILY TAKE WITH OR IMMEDIATELY  FOLLOWING A MEAL   nystatin cream (MYCOSTATIN) Apply 1 application. topically 2 (two) times daily.   pantoprazole (PROTONIX) 40 MG tablet Take 1 tablet (40 mg total) by mouth daily.   PARoxetine (PAXIL) 10 MG tablet Take 1 tablet (10 mg total) by mouth every morning.   No facility-administered encounter medications on file as of 01/03/2022.    Allergies (verified) Patient has no known allergies.   History: Past Medical History:  Diagnosis Date   Coronary artery disease    status post coronary artery bypass grafting x3 in 1999   GERD (gastroesophageal reflux disease)    Hyperlipidemia    Hypertension    Renal artery stenosis (HCC)    S/P CABG (coronary artery bypass graft) 06/18/1997   x3   Type 2 diabetes mellitus ([place])    Past Surgical History:  Procedure Laterality Date   BIOPSY  08/19/2021   Procedure: BIOPSY;  Surgeon: Harvel Quale, MD;  [facility]: [facility];  Service: Gastroenterology;;   CARDIAC CATHETERIZATION  04/17/2007   L  main 50-60% ostital stenosis, patent LAD, 90% stenosis at L Cfx, dominant RCA with 60-70% segmental stenosis, patent LIMA-LAD, patent free LIMA-OM, patent VG-PDA (Dr. Adora Fridge)   CARDIAC CATHETERIZATION  03/02/2003   L main with 50% osital stenosis, LAD totally occluded after 2nd diagonal, L Cfx with 90% ostial OM, RCA with 70-80% segmental stenosis, patent LIMA-LAD, patent free RIMA-Cfx marginal, patent VG to PDA (Dr. Adora Fridge)   CARDIAC CATHETERIZATION  05/18/1998   significant ostial Cfx & OM & mid LAD disease - subsequent CABG (Dr. Adora Fridge)   CAROTID DOPPLER  03/2006   right bulb with 0-49% diameter reduction   CORONARY ARTERY BYPASS GRAFT  05/19/1998   LIMA to LAD, free RIMA to OM, VG  to distal RCA   ESOPHAGOGASTRODUODENOSCOPY (EGD) WITH PROPOFOL N/A 08/19/2021   Procedure: ESOPHAGOGASTRODUODENOSCOPY (EGD) WITH PROPOFOL;  Surgeon: Harvel Quale, MD;  [facility];  Service: Gastroenterology;  Laterality: N/A;   ESOPHAGOGASTRODUODENOSCOPY (EGD) WITH PROPOFOL N/A 10/17/2021   Procedure: ESOPHAGOGASTRODUODENOSCOPY (EGD) WITH PROPOFOL;  Surgeon: Harvel Quale, MD;  Location: [facility];  Service: Gastroenterology;  Laterality: N/A;  [place]  04/2011   lexiscan - fixed paical and basal-mid lateral defect, no reversible ischemia; EF 58%; PVCs noted; low risk   RENAL DOPPLER  09/2012   right renal artery 60-99% diameter reduction, left renal artery 1-59% diameter reduction   TOTAL KNEE ARTHROPLASTY Right    Dr. Luna Glasgow   Family History  Problem Relation Age of Onset   Stroke Mother    Heart attack Son    Social History   Socioeconomic History   Marital status: Widowed    Spouse name: Not on file   Number of children: 3   Years of education: 12   Highest education level: High school graduate  Occupational History   Occupation: retired  Tobacco Use   Smoking status: Never   Smokeless tobacco: Never  Vaping Use   Vaping Use: Never used  Substance and Sexual Activity   Alcohol use: No   Drug use: No   Sexual activity: Not Currently    Birth control/protection: Post-menopausal  Other Topics Concern   Not on file  Social History Narrative   Not on file   Social Determinants of Health   Financial Resource Strain: Low Risk  (01/03/2022)   Overall Financial Resource Strain (CARDIA)    Difficulty of Paying Living Expenses: Not hard at all  Food Insecurity: No Food Insecurity (01/03/2022)   Hunger Vital Sign    Worried About Running Out of Food in the Last Year: Never true    [place] in the Last Year: Never true  Transportation Needs: No Transportation Needs (01/03/2022)   PRAPARE - Armed forces logistics/support/administrative officer (Medical): No    Lack of Transportation (Non-Medical): No  Physical Activity: Inactive (01/03/2022)   Exercise Vital Sign    Days of Exercise per Week: 0 days    Minutes of Exercise per Session: 0 min  Stress: No Stress Concern Present (01/03/2022)   [place]    Feeling of Stress : Not at all  Social Connections: Moderately Integrated (01/03/2022)   Social Connection and Isolation Panel [NHANES]    Frequency of Communication with Friends and Family: More than three times a week    Frequency of Social Gatherings with Friends and Family: More than three times a week  Attends Religious Services: More than 4 times per year    Active Member of Clubs or Organizations: Yes    Attends Banker Meetings: More than 4 times per year    Marital Status: Widowed    Tobacco Counseling Counseling given: Not Answered   Clinical Intake:  Pre-visit preparation completed: Yes        Diabetes: Yes (Followed by PCP)  How often do you need to have someone help you when you read instructions, pamphlets, or other written materials from your doctor or pharmacy?: 3 - Sometimes    Interpreter Needed?: No    Activities of Daily Living    01/03/2022    4:23 PM 10/13/2021   10:14 AM  In your present state of health, do you have any difficulty performing the following activities:  Hearing? 0   Vision? 0   Difficulty concentrating or making decisions? 1   Walking or climbing stairs? 1   Comment Walker in use as needed   Dressing or bathing? 1   Comment Son/family assist as needed   Doing errands, shopping? 1 0  Comment Son/family assist   Preparing Food and eating ? Y   Comment Son/family assist. Meals on Wheels. Self feeds.   Using the Toilet? N   In the past six months, have you accidently leaked urine? N   Do you have problems with loss of bowel control? N   Managing your Medications? Y    Comment Son assist   Managing your Finances? Y   Comment Son Veterinary surgeon or managing your Housekeeping? Y   Comment Son/family assist     Patient Care Team: Junie Spencer, FNP as PCP - General (Family Medicine) Runell Gess, MD as PCP - Cardiology (Cardiology) Michaelle Copas, MD as Referring Physician (Optometry)  Indicate any recent Medical Services you may have received from other than [facility] providers in the past year (date may be approximate).     Assessment:   This is a routine wellness examination for Jaella.  Virtual Visit via Telephone Note  I connected with  DEBI COUSIN on 01/03/22 at  3:30 PM EDT by telephone and verified that I am speaking with the correct person using two identifiers.  Persons participating in the virtual visit: patient/sister/Nurse Health Advisor.   I discussed the limitations of performing an evaluation and management service by telehealth. We continued and completed visit with audio only. Some vital signs may be absent or patient reported.   Hearing/Vision screen Hearing Screening - Comments:: Patient is able to hear conversational tones without difficulty.  No issues reported.  Vision Screening - Comments:: Followed by Orie Fisherman [place] Wears corrective lenses They have seen their ophthalmologist in the last 12 months.    Dietary issues and exercise activities discussed:   Low carb diet Good water intake   Goals Addressed             This Visit's Progress    DIET - INCREASE WATER INTAKE   On track    Try to drink 6-8 glasses of water daily.       Depression Screen    01/03/2022    3:42 PM 12/28/2021    9:44 AM 12/07/2021    2:32 PM 05/19/2021    9:21 AM 02/16/2021    9:07 AM 11/28/2020    9:38 AM 11/16/2020   10:17 AM  PHQ 2/9 Scores  PHQ - 2 Score 0 0 0 0 0 0  0  PHQ- 9 Score 0 2 0 0 0  0    Fall Risk    01/03/2022    3:48 PM 12/28/2021    9:43 AM 12/07/2021    2:32 PM 05/19/2021    9:21 AM 02/16/2021     9:07 AM  Fall Risk   Falls in the past year?  1 0 0 0  Number falls in past yr:  0     Injury with Fall?  1     Risk for fall due to : History of fall(s)      Follow up Falls evaluation completed Falls prevention discussed      FALL RISK PREVENTION PERTAINING TO THE HOME: Home free of loose throw rugs in walkways, pet beds, electrical cords, etc? Yes  Adequate lighting in your home to reduce risk of falls? Yes   ASSISTIVE DEVICES UTILIZED TO PREVENT FALLS: Life alert? No  Use of a cane, walker or w/c? Yes  Grab bars in the bathroom? Yes  Shower chair or bench in shower? Yes  Elevated toilet seat or a handicapped toilet? Yes   TIMED UP AND GO: Was the test performed? No .   Cognitive Function: Patient is alert. See 6CIT.  Followed up offered. Declined at this time.      03/06/2021   11:32 AM  MMSE - Mini Mental State Exam  Orientation to time 4  Orientation to Place 4  Registration 3  Attention/ Calculation 4  Recall 0  Language- name 2 objects 2  Language- repeat 0  Language- follow 3 step command 3  Language- read & follow direction 1  Write a sentence 1  Copy design 0  Total score 22        01/03/2022    3:50 PM 09/29/2019    9:30 AM  6CIT Screen  What Year? 0 points 0 points  What month? 3 points 0 points  What time? 0 points 0 points  Count back from 20 0 points 0 points  Months in reverse 4 points 4 points  Repeat phrase 10 points 2 points  Total Score 17 points 6 points    Immunizations Immunization History  Administered Date(s) Administered   Fluad Quad(high Dose 65+) 03/06/2019, 05/19/2020, 05/19/2021   Influenza, High Dose Seasonal PF 04/07/2015, 04/18/2016, 04/08/2017, 03/26/2018   Influenza,inj,Quad PF,6+ Mos 04/18/2016, 04/08/2017, 03/26/2018   Influenza,trivalent, recombinat, inj, PF 04/12/2014   Moderna Sars-Covid-2 Vaccination 08/12/2019, 09/09/2019, 05/24/2020   Pneumococcal Conjugate-13 07/27/2016   Pneumococcal Polysaccharide-23  12/25/2017    TDAP status: Due, Education has been provided regarding the importance of this vaccine. Advised may receive this vaccine at local pharmacy or Health Dept. Aware to provide a copy of the vaccination record if obtained from local pharmacy or Health Dept. Verbalized acceptance and understanding.  Shingrix Completed?: No.    Education has been provided regarding the importance of this vaccine. Patient has been advised to call insurance company to determine out of pocket expense if they have not yet received this vaccine. Advised may also receive vaccine at local pharmacy or Health Dept. Verbalized acceptance and understanding.  Screening Tests Health Maintenance  Topic Date Due   HEMOGLOBIN A1C  01/16/2022 (Originally 11/17/2021)   COVID-19 Vaccine (4 - Moderna series) 01/19/2022 (Originally 07/19/2020)   TETANUS/TDAP  03/18/2022 (Originally 03/03/1960)   Zoster Vaccines- Shingrix (1 of 2) 04/05/2022 (Originally 03/04/1991)   INFLUENZA VACCINE  01/16/2022   FOOT EXAM  02/16/2022   OPHTHALMOLOGY EXAM  12/01/2022  Pneumonia Vaccine 73+ Years old  Completed   DEXA SCAN  Completed   HPV VACCINES  Aged Out   Health Maintenance There are no preventive care reminders to display for this patient.  Lung Cancer Screening: (Low Dose CT Chest recommended if Age 62-80 years, 30 pack-year currently smoking OR have quit w/in 15years.) does not qualify.   Hepatitis C Screening: does not qualify.  Vision Screening: Recommended annual ophthalmology exams for early detection of glaucoma and other disorders of the eye.  Dental Screening: Recommended annual dental exams for proper oral hygiene  Community Resource Referral / Chronic Care Management: CRR required this visit?  No   CCM required this visit?  No      Plan:   Keep all routine maintenance appointments.   I have personally reviewed and noted the following in the patient's chart:   Medical and social history Use of alcohol,  tobacco or illicit drugs  Current medications and supplements including opioid prescriptions. No longer taking opioid.  Functional ability and status Nutritional status Physical activity Advanced directives List of other physicians Hospitalizations, surgeries, and ER visits in previous 12 months Vitals Screenings to include cognitive, depression, and falls Referrals and appointments  In addition, I have reviewed and discussed with patient certain preventive protocols, quality metrics, and best practice recommendations. A written personalized care plan for preventive services as well as general preventive health recommendations were provided to patient.   Nurse notes: Patient and family decline follow up for difficulty with memory at this time. Agrees to discuss a little more and notify PCP when and if ready to proceed.  Varney Biles, LPN   [id number]

## 2022-01-10 ENCOUNTER — Other Ambulatory Visit: Payer: Self-pay | Admitting: Family

## 2022-01-10 DIAGNOSIS — I480 Paroxysmal atrial fibrillation: Secondary | ICD-10-CM

## 2022-01-17 ENCOUNTER — Ambulatory Visit (INDEPENDENT_AMBULATORY_CARE_PROVIDER_SITE_OTHER): Payer: Medicare Other | Admitting: Family Medicine

## 2022-01-17 ENCOUNTER — Ambulatory Visit (INDEPENDENT_AMBULATORY_CARE_PROVIDER_SITE_OTHER): Payer: Medicare Other

## 2022-01-17 ENCOUNTER — Encounter: Payer: Self-pay | Admitting: Family Medicine

## 2022-01-17 VITALS — BP 134/86 | HR 88 | Temp 98.9°F | Ht 65.0 in | Wt 150.0 lb

## 2022-01-17 DIAGNOSIS — M545 Low back pain, unspecified: Secondary | ICD-10-CM | POA: Diagnosis not present

## 2022-01-17 DIAGNOSIS — M4156 Other secondary scoliosis, lumbar region: Secondary | ICD-10-CM

## 2022-01-17 DIAGNOSIS — R413 Other amnesia: Secondary | ICD-10-CM

## 2022-01-17 DIAGNOSIS — R5381 Other malaise: Secondary | ICD-10-CM

## 2022-01-17 MED ORDER — SALONPAS 3.1-6-10 % EX PTCH: 1.0000 | MEDICATED_PATCH | Freq: Every day | CUTANEOUS | 0 refills | Status: DC

## 2022-01-17 NOTE — Progress Notes (Signed)
Subjective:  Patient ID: Virginia [name], female    DOB: Jan 09, 1941, 81 y.o.   MRN: [id number]  Patient Care Team: Sharion Balloon, FNP as PCP - General (Family Medicine) Lorretta Harp, MD as PCP - Cardiology (Cardiology) Harlen Labs, MD as Referring Physician (Optometry)   Chief Complaint:  Back Pain   HPI: Virginia [name] is a 81 y.o. female presenting on 01/17/2022 for Back Pain   Pt presents today for evaluation of lower back pain over the last several days. Family is with pt and reports she has memory issues and is unable to recall if she has injured herself but denies recent fall. Pt states her lower back hurts and does not radiate to hips or legs. No reported saddle anesthesia or loss of bowel or bladder. She denies urinary symptoms. No fever, chills, or fatigue.  When pt was getting imaging completed, family member voiced concerns about pt being able to care for herself at home. States her memory issues are becoming worse and she is starting to forget to take her medications. States her gait is very unsteady and she has fell in the past. Pt was seen by neurology 02/2021 and was diagnosed with dementia, was not started on medications, was to return for reevaluation in 6 months. Has not followed up.   Back Pain The current episode started in the past 7 days. The problem occurs constantly. The problem has been waxing and waning since onset. The pain is present in the lumbar spine. The pain does not radiate. The pain is moderate. The symptoms are aggravated by standing, twisting and position. Associated symptoms include weakness (generalized). Pertinent negatives include no abdominal pain, bladder incontinence, bowel incontinence, chest pain, dysuria, fever, headaches, leg pain, numbness, paresis, paresthesias, pelvic pain, perianal numbness, tingling or weight loss. She has tried analgesics for the symptoms. The treatment provided mild relief.    Relevant past medical,  surgical, family, and social history reviewed and updated as indicated.  Allergies and medications reviewed and updated. Data reviewed: Chart in Epic.   Past Medical History:  Diagnosis Date   Coronary artery disease    status post coronary artery bypass grafting x3 in 1999   GERD (gastroesophageal reflux disease)    Hyperlipidemia    Hypertension    Renal artery stenosis (HCC)    S/P CABG (coronary artery bypass graft) 06/18/1997   x3   Type 2 diabetes mellitus ([place])     Past Surgical History:  Procedure Laterality Date   BIOPSY  08/19/2021   Procedure: BIOPSY;  Surgeon: Montez Morita, Quillian Quince, MD;  Location: [facility];  Service: Gastroenterology;;   CARDIAC CATHETERIZATION  04/17/2007   L main 50-60% ostital stenosis, patent LAD, 90% stenosis at L Cfx, dominant RCA with 60-70% segmental stenosis, patent LIMA-LAD, patent free LIMA-OM, patent VG-PDA (Dr. Adora Fridge)   CARDIAC CATHETERIZATION  03/02/2003   L main with 50% osital stenosis, LAD totally occluded after 2nd diagonal, L Cfx with 90% ostial OM, RCA with 70-80% segmental stenosis, patent LIMA-LAD, patent free RIMA-Cfx marginal, patent VG to PDA (Dr. Adora Fridge)   CARDIAC CATHETERIZATION  05/18/1998   significant ostial Cfx & OM & mid LAD disease - subsequent CABG (Dr. Adora Fridge)   CAROTID DOPPLER  03/2006   right bulb with 0-49% diameter reduction   CORONARY ARTERY BYPASS GRAFT  05/19/1998   LIMA to LAD, free RIMA to OM, VG to distal RCA   ESOPHAGOGASTRODUODENOSCOPY (EGD) WITH PROPOFOL  N/A 08/19/2021   Procedure: ESOPHAGOGASTRODUODENOSCOPY (EGD) WITH PROPOFOL;  Surgeon: Harvel Quale, MD;  Location: [facility];  Service: Gastroenterology;  Laterality: N/A;   ESOPHAGOGASTRODUODENOSCOPY (EGD) WITH PROPOFOL N/A 10/17/2021   Procedure: ESOPHAGOGASTRODUODENOSCOPY (EGD) WITH PROPOFOL;  Surgeon: Harvel Quale, MD;  Location: [facility];  Service: Gastroenterology;  Laterality: N/A;  [place]  04/2011   lexiscan - fixed paical and basal-mid lateral defect, no reversible ischemia; EF 58%; PVCs noted; low risk   RENAL DOPPLER  09/2012   right renal artery 60-99% diameter reduction, left renal artery 1-59% diameter reduction   TOTAL KNEE ARTHROPLASTY Right    Dr. Luna Glasgow    Social History   Socioeconomic History   Marital status: Widowed    Spouse name: Not on file   Number of children: 3   Years of education: 12   Highest education level: High school graduate  Occupational History   Occupation: retired  Tobacco Use   Smoking status: Never   Smokeless tobacco: Never  Vaping Use   Vaping Use: Never used  Substance and Sexual Activity   Alcohol use: No   Drug use: No   Sexual activity: Not Currently    Birth control/protection: Post-menopausal  Other Topics Concern   Not on file  Social History Narrative   Not on file   Social Determinants of Health   Financial Resource Strain: Low Risk  (01/03/2022)   Overall Financial Resource Strain (CARDIA)    Difficulty of Paying Living Expenses: Not hard at all  Food Insecurity: No Food Insecurity (01/03/2022)   Hunger Vital Sign    Worried About Running Out of Food in the Last Year: Never true    [place] in the Last Year: Never true  Transportation Needs: No Transportation Needs (01/03/2022)   PRAPARE - Hydrologist (Medical): No    Lack of Transportation (Non-Medical): No  Physical Activity: Inactive (01/03/2022)   Exercise Vital Sign    Days of Exercise per Week: 0 days    Minutes of Exercise per Session: 0 min  Stress: No Stress Concern Present (01/03/2022)   [place]    Feeling of Stress : Not at all  Social Connections: Moderately Integrated (01/03/2022)   Social Connection and Isolation Panel [NHANES]    Frequency of Communication with Friends and Family: More than three times a week    Frequency of Social  Gatherings with Friends and Family: More than three times a week    Attends Religious Services: More than 4 times per year    Active Member of [employer] or Organizations: Yes    Attends Archivist Meetings: More than 4 times per year    Marital Status: Widowed  Intimate Partner Violence: Not At Risk (01/03/2022)   Humiliation, Afraid, Rape, and Kick questionnaire    Fear of Current or Ex-Partner: No    Emotionally Abused: No    Physically Abused: No    Sexually Abused: No    Outpatient Encounter Medications as of 01/17/2022  Medication Sig   acetaminophen (TYLENOL) 500 MG tablet Take 500-1,000 mg by mouth every 6 (six) hours as needed (for pain.).   atorvastatin (LIPITOR) 80 MG tablet Take 1 tablet (80 mg total) by mouth daily.   Camphor-Menthol-Methyl Sal (SALONPAS) 3.06-23-08 % PTCH Apply 1 patch topically daily.   ELIQUIS 5 MG TABS tablet TAKE 1 TABLET  BY MOUTH TWICE  DAILY   ferrous sulfate 325 (65 FE) MG tablet Take 650 mg by mouth at bedtime.   hydrochlorothiazide (MICROZIDE) 12.5 MG capsule Take 1 capsule (12.5 mg total) by mouth daily.   lisinopril (ZESTRIL) 20 MG tablet Take 1 tablet (20 mg total) by mouth daily.   metoprolol succinate (TOPROL-XL) 50 MG 24 hr tablet TAKE 1 AND 1/2 TABLETS BY MOUTH  DAILY TAKE WITH OR IMMEDIATELY  FOLLOWING A MEAL   nystatin cream (MYCOSTATIN) Apply 1 application. topically 2 (two) times daily.   pantoprazole (PROTONIX) 40 MG tablet Take 1 tablet (40 mg total) by mouth daily.   PARoxetine (PAXIL) 10 MG tablet TAKE 1 TABLET BY MOUTH IN THE  MORNING   [DISCONTINUED] HYDROcodone-acetaminophen (NORCO/VICODIN) 5-325 MG tablet Take 1 tablet by mouth every 8 (eight) hours as needed (pain.).   No facility-administered encounter medications on file as of 01/17/2022.    No Known Allergies  Review of Systems  Constitutional:  Positive for activity change. Negative for appetite change, chills, diaphoresis, fatigue, fever, unexpected weight change and  weight loss.  Eyes:  Negative for photophobia and visual disturbance.  Respiratory:  Negative for cough and shortness of breath.   Cardiovascular:  Negative for chest pain, palpitations and leg swelling.  Gastrointestinal:  Negative for abdominal pain and bowel incontinence.  Genitourinary:  Negative for bladder incontinence, decreased urine volume, difficulty urinating, dysuria and pelvic pain.  Musculoskeletal:  Positive for back pain and gait problem. Negative for arthralgias, joint swelling, myalgias, neck pain and neck stiffness.  Neurological:  Positive for weakness (generalized). Negative for dizziness, tingling, tremors, seizures, syncope, facial asymmetry, speech difficulty, light-headedness, numbness, headaches and paresthesias.  Psychiatric/Behavioral:  Positive for confusion. Negative for agitation, behavioral problems, decreased concentration, dysphoric mood, hallucinations, self-injury, sleep disturbance and suicidal ideas. The patient is not nervous/anxious and is not hyperactive.   All other systems reviewed and are negative.       Objective:  BP 134/86   Pulse 88   Temp 98.9 F (37.2 C)   Ht '5\' 5"'  (1.651 m)   Wt 150 lb (68 kg)   SpO2 94%   BMI 24.96 kg/m    Wt Readings from Last 3 Encounters:  01/17/22 150 lb (68 kg)  01/03/22 150 lb (68 kg)  12/27/21 150 lb (68 kg)    Physical Exam Vitals reviewed.  Constitutional:      General: She is not in acute distress.    Appearance: She is well-developed.  HENT:     Head: Normocephalic and atraumatic.     Right Ear: External ear normal.  Eyes:     Pupils: Pupils are equal, round, and reactive to light.  Neck:     Thyroid: No thyromegaly.  Cardiovascular:     Rate and Rhythm: Normal rate. Rhythm irregularly irregular.     Heart sounds: Normal heart sounds. No murmur heard. Pulmonary:     Effort: Pulmonary effort is normal. No respiratory distress.     Breath sounds: Decreased breath sounds present. No wheezing.      Comments: SOB with exertion  Abdominal:     General: Bowel sounds are normal. There is no distension.     Palpations: Abdomen is soft.     Tenderness: There is no abdominal tenderness.  Musculoskeletal:     Cervical back: Normal, normal range of motion and neck supple.     Thoracic back: No swelling, edema, deformity, signs of trauma, lacerations, spasms, tenderness or bony tenderness. Decreased  range of motion. Scoliosis present.     Lumbar back: Tenderness present. No swelling, edema, deformity, signs of trauma, lacerations, spasms or bony tenderness. Decreased range of motion. Negative right straight leg raise test and negative left straight leg raise test. Scoliosis present.     Right lower leg: 1+ Edema present.     Left lower leg: 1+ Edema present.  Skin:    General: Skin is warm and dry.     Capillary Refill: Capillary refill takes less than 2 seconds.  Neurological:     General: No focal deficit present.     Mental Status: She is alert and oriented to person, place, and time.     Cranial Nerves: No cranial nerve deficit.     Motor: Weakness (generalized) and tremor (resting) present. No atrophy, abnormal muscle tone, seizure activity or pronator drift.     Deep Tendon Reflexes: Reflexes are normal and symmetric.     Comments: Resting tremor in hand  Psychiatric:        Attention and Perception: Attention and perception normal.        Mood and Affect: Mood normal.        Speech: Speech normal.        Behavior: Behavior normal. Behavior is cooperative.        Thought Content: Thought content normal.        Cognition and Memory: Memory is impaired.        Judgment: Judgment normal.     Results for orders placed or performed in visit on 12/28/21  Anemia Profile B  Result Value Ref Range   Total Iron Binding Capacity 280 250 - 450 ug/dL   UIBC 218 118 - 369 ug/dL   Iron 62 27 - 139 ug/dL   Iron Saturation 22 15 - 55 %   Ferritin 80 15 - 150 ng/mL   Vitamin B-12 345 232  - 1,245 pg/mL   Folate >20.0 >3.0 ng/mL   WBC 4.3 3.4 - 10.8 x10E3/uL   RBC 4.34 3.77 - 5.28 x10E6/uL   Hemoglobin 12.9 11.1 - 15.9 g/dL   Hematocrit 39.1 34.0 - 46.6 %   MCV 90 79 - 97 fL   MCH 29.7 26.6 - 33.0 pg   MCHC 33.0 31.5 - 35.7 g/dL   RDW 15.3 11.7 - 15.4 %   Platelets 143 (L) 150 - 450 x10E3/uL   Neutrophils 59 Not Estab. %   Lymphs 26 Not Estab. %   Monocytes 12 Not Estab. %   Eos 2 Not Estab. %   Basos 1 Not Estab. %   Neutrophils Absolute 2.5 1.4 - 7.0 x10E3/uL   Lymphocytes Absolute 1.1 0.7 - 3.1 x10E3/uL   Monocytes Absolute 0.5 0.1 - 0.9 x10E3/uL   EOS (ABSOLUTE) 0.1 0.0 - 0.4 x10E3/uL   Basophils Absolute 0.0 0.0 - 0.2 x10E3/uL   Immature Granulocytes 0 Not Estab. %   Immature Grans (Abs) 0.0 0.0 - 0.1 x10E3/uL   Retic Ct Pct 1.4 0.6 - 2.6 %  CMP14+EGFR  Result Value Ref Range   Glucose 197 (H) 70 - 99 mg/dL   BUN 11 8 - 27 mg/dL   Creatinine, Ser 0.86 0.57 - 1.00 mg/dL   eGFR 68 >59 mL/min/1.73   BUN/Creatinine Ratio 13 12 - 28   Sodium 140 134 - 144 mmol/L   Potassium 4.2 3.5 - 5.2 mmol/L   Chloride 103 96 - 106 mmol/L   CO2 22 20 - 29 mmol/L   Calcium 10.0  8.7 - 10.3 mg/dL   Total Protein 5.7 (L) 6.0 - 8.5 g/dL   Albumin 4.1 3.8 - 4.8 g/dL   Globulin, Total 1.6 1.5 - 4.5 g/dL   Albumin/Globulin Ratio 2.6 (H) 1.2 - 2.2   Bilirubin Total 2.1 (H) 0.0 - 1.2 mg/dL   Alkaline Phosphatase 109 44 - 121 IU/L   AST 33 0 - 40 IU/L   ALT 25 0 - 32 IU/L  TSH  Result Value Ref Range   TSH 2.340 0.450 - 4.500 uIU/mL  Brain natriuretic peptide  Result Value Ref Range   BNP 533.2 (H) 0.0 - 100.0 pg/mL     X-Ray: lumbar spine: significant scoliosis of lumbar spine, unable to determine if fracture is present, STAT read requested. Preliminary x-ray reading by Monia Pouch, FNP-C, [facility].   Pertinent labs & imaging results that were available during my care of the patient were reviewed by me and considered in my medical decision making.  Assessment & Plan:   Estie was seen today for back pain.  Diagnoses and all orders for this visit:  Acute bilateral low back pain without sciatica Scoliosis of lumbar region due to degenerative disease of spine in adult Imaging without definite acute injury per radiology reading, no red flags present. Pt aware to report new,  worsening, or persistent symptoms. May require additional imaging. Continue Tylenol and start salonpas patches as prescribed.  -     DG Lumbar Spine 2-3 Views -     Camphor-Menthol-Methyl Sal (SALONPAS) 3.06-23-08 % PTCH; Apply 1 patch topically daily. -     Ambulatory referral to Home Health  Physical deconditioning Memory changes General weakness. Worsening memory. Now forgetting medications. Aware to make follow up with neurology. Referral to home health placed.  -     Ambulatory referral to [place] all other maintenance medications.  Follow up plan: Return in 2 weeks (on 01/31/2022), or if symptoms worsen or fail to improve, for back pain.   Continue healthy lifestyle choices, including diet (rich in fruits, vegetables, and lean proteins, and low in salt and simple carbohydrates) and exercise (at least 30 minutes of moderate physical activity daily).  Educational handout given for back pain  The above assessment and management plan was discussed with the patient. The patient verbalized understanding of and has agreed to the management plan. Patient is aware to call the clinic if they develop any new symptoms or if symptoms persist or worsen. Patient is aware when to return to the clinic for a follow-up visit. Patient educated on when it is appropriate to go to the emergency department.   Monia Pouch, FNP-C [facility] [phone]

## 2022-01-20 ENCOUNTER — Other Ambulatory Visit: Payer: Self-pay | Admitting: Family Medicine

## 2022-01-20 DIAGNOSIS — R600 Localized edema: Secondary | ICD-10-CM

## 2022-01-26 DIAGNOSIS — I482 Chronic atrial fibrillation, unspecified: Secondary | ICD-10-CM | POA: Diagnosis not present

## 2022-01-26 DIAGNOSIS — M1990 Unspecified osteoarthritis, unspecified site: Secondary | ICD-10-CM | POA: Diagnosis not present

## 2022-01-26 DIAGNOSIS — I10 Essential (primary) hypertension: Secondary | ICD-10-CM | POA: Diagnosis not present

## 2022-01-26 DIAGNOSIS — M4146 Neuromuscular scoliosis, lumbar region: Secondary | ICD-10-CM | POA: Diagnosis not present

## 2022-01-26 DIAGNOSIS — M5136 Other intervertebral disc degeneration, lumbar region: Secondary | ICD-10-CM | POA: Diagnosis not present

## 2022-01-26 DIAGNOSIS — I2510 Atherosclerotic heart disease of native coronary artery without angina pectoris: Secondary | ICD-10-CM | POA: Diagnosis not present

## 2022-01-27 ENCOUNTER — Other Ambulatory Visit: Payer: Self-pay | Admitting: Family

## 2022-01-27 DIAGNOSIS — R600 Localized edema: Secondary | ICD-10-CM

## 2022-01-30 DIAGNOSIS — M5136 Other intervertebral disc degeneration, lumbar region: Secondary | ICD-10-CM | POA: Diagnosis not present

## 2022-01-30 DIAGNOSIS — M4146 Neuromuscular scoliosis, lumbar region: Secondary | ICD-10-CM | POA: Diagnosis not present

## 2022-01-30 DIAGNOSIS — M1990 Unspecified osteoarthritis, unspecified site: Secondary | ICD-10-CM | POA: Diagnosis not present

## 2022-01-30 DIAGNOSIS — I2510 Atherosclerotic heart disease of native coronary artery without angina pectoris: Secondary | ICD-10-CM | POA: Diagnosis not present

## 2022-01-30 DIAGNOSIS — I10 Essential (primary) hypertension: Secondary | ICD-10-CM | POA: Diagnosis not present

## 2022-01-30 DIAGNOSIS — I482 Chronic atrial fibrillation, unspecified: Secondary | ICD-10-CM | POA: Diagnosis not present

## 2022-02-01 ENCOUNTER — Other Ambulatory Visit: Payer: Self-pay | Admitting: Family

## 2022-02-01 DIAGNOSIS — I2510 Atherosclerotic heart disease of native coronary artery without angina pectoris: Secondary | ICD-10-CM | POA: Diagnosis not present

## 2022-02-01 DIAGNOSIS — M5136 Other intervertebral disc degeneration, lumbar region: Secondary | ICD-10-CM | POA: Diagnosis not present

## 2022-02-01 DIAGNOSIS — I10 Essential (primary) hypertension: Secondary | ICD-10-CM | POA: Diagnosis not present

## 2022-02-01 DIAGNOSIS — M4146 Neuromuscular scoliosis, lumbar region: Secondary | ICD-10-CM | POA: Diagnosis not present

## 2022-02-01 DIAGNOSIS — M1990 Unspecified osteoarthritis, unspecified site: Secondary | ICD-10-CM | POA: Diagnosis not present

## 2022-02-01 DIAGNOSIS — I482 Chronic atrial fibrillation, unspecified: Secondary | ICD-10-CM | POA: Diagnosis not present

## 2022-02-02 DIAGNOSIS — I10 Essential (primary) hypertension: Secondary | ICD-10-CM | POA: Diagnosis not present

## 2022-02-02 DIAGNOSIS — M4146 Neuromuscular scoliosis, lumbar region: Secondary | ICD-10-CM | POA: Diagnosis not present

## 2022-02-02 DIAGNOSIS — I482 Chronic atrial fibrillation, unspecified: Secondary | ICD-10-CM | POA: Diagnosis not present

## 2022-02-02 DIAGNOSIS — M5136 Other intervertebral disc degeneration, lumbar region: Secondary | ICD-10-CM | POA: Diagnosis not present

## 2022-02-02 DIAGNOSIS — M1990 Unspecified osteoarthritis, unspecified site: Secondary | ICD-10-CM | POA: Diagnosis not present

## 2022-02-02 DIAGNOSIS — I2510 Atherosclerotic heart disease of native coronary artery without angina pectoris: Secondary | ICD-10-CM | POA: Diagnosis not present

## 2022-02-06 DIAGNOSIS — I2510 Atherosclerotic heart disease of native coronary artery without angina pectoris: Secondary | ICD-10-CM | POA: Diagnosis not present

## 2022-02-06 DIAGNOSIS — M1990 Unspecified osteoarthritis, unspecified site: Secondary | ICD-10-CM | POA: Diagnosis not present

## 2022-02-06 DIAGNOSIS — M4146 Neuromuscular scoliosis, lumbar region: Secondary | ICD-10-CM | POA: Diagnosis not present

## 2022-02-06 DIAGNOSIS — I482 Chronic atrial fibrillation, unspecified: Secondary | ICD-10-CM | POA: Diagnosis not present

## 2022-02-06 DIAGNOSIS — M5136 Other intervertebral disc degeneration, lumbar region: Secondary | ICD-10-CM | POA: Diagnosis not present

## 2022-02-06 DIAGNOSIS — I10 Essential (primary) hypertension: Secondary | ICD-10-CM | POA: Diagnosis not present

## 2022-02-07 DIAGNOSIS — M1990 Unspecified osteoarthritis, unspecified site: Secondary | ICD-10-CM | POA: Diagnosis not present

## 2022-02-07 DIAGNOSIS — I10 Essential (primary) hypertension: Secondary | ICD-10-CM | POA: Diagnosis not present

## 2022-02-07 DIAGNOSIS — I482 Chronic atrial fibrillation, unspecified: Secondary | ICD-10-CM | POA: Diagnosis not present

## 2022-02-07 DIAGNOSIS — M5136 Other intervertebral disc degeneration, lumbar region: Secondary | ICD-10-CM | POA: Diagnosis not present

## 2022-02-07 DIAGNOSIS — I2510 Atherosclerotic heart disease of native coronary artery without angina pectoris: Secondary | ICD-10-CM | POA: Diagnosis not present

## 2022-02-07 DIAGNOSIS — M4146 Neuromuscular scoliosis, lumbar region: Secondary | ICD-10-CM | POA: Diagnosis not present

## 2022-02-09 DIAGNOSIS — I10 Essential (primary) hypertension: Secondary | ICD-10-CM | POA: Diagnosis not present

## 2022-02-09 DIAGNOSIS — M1990 Unspecified osteoarthritis, unspecified site: Secondary | ICD-10-CM | POA: Diagnosis not present

## 2022-02-09 DIAGNOSIS — I2510 Atherosclerotic heart disease of native coronary artery without angina pectoris: Secondary | ICD-10-CM | POA: Diagnosis not present

## 2022-02-09 DIAGNOSIS — I482 Chronic atrial fibrillation, unspecified: Secondary | ICD-10-CM | POA: Diagnosis not present

## 2022-02-09 DIAGNOSIS — M5136 Other intervertebral disc degeneration, lumbar region: Secondary | ICD-10-CM | POA: Diagnosis not present

## 2022-02-09 DIAGNOSIS — M4146 Neuromuscular scoliosis, lumbar region: Secondary | ICD-10-CM | POA: Diagnosis not present

## 2022-02-12 ENCOUNTER — Encounter: Payer: Self-pay | Admitting: Family

## 2022-02-12 ENCOUNTER — Ambulatory Visit (INDEPENDENT_AMBULATORY_CARE_PROVIDER_SITE_OTHER): Payer: Medicare Other | Admitting: Family

## 2022-02-12 VITALS — BP 128/77 | HR 91 | Temp 97.7°F | Ht 65.0 in | Wt 139.0 lb

## 2022-02-12 DIAGNOSIS — D649 Anemia, unspecified: Secondary | ICD-10-CM

## 2022-02-12 DIAGNOSIS — M5136 Other intervertebral disc degeneration, lumbar region: Secondary | ICD-10-CM | POA: Diagnosis not present

## 2022-02-12 DIAGNOSIS — K219 Gastro-esophageal reflux disease without esophagitis: Secondary | ICD-10-CM

## 2022-02-12 DIAGNOSIS — R634 Abnormal weight loss: Secondary | ICD-10-CM

## 2022-02-12 DIAGNOSIS — M1990 Unspecified osteoarthritis, unspecified site: Secondary | ICD-10-CM | POA: Diagnosis not present

## 2022-02-12 DIAGNOSIS — I2583 Coronary atherosclerosis due to lipid rich plaque: Secondary | ICD-10-CM

## 2022-02-12 DIAGNOSIS — E1169 Type 2 diabetes mellitus with other specified complication: Secondary | ICD-10-CM | POA: Diagnosis not present

## 2022-02-12 DIAGNOSIS — F331 Major depressive disorder, recurrent, moderate: Secondary | ICD-10-CM

## 2022-02-12 DIAGNOSIS — R8290 Unspecified abnormal findings in urine: Secondary | ICD-10-CM

## 2022-02-12 DIAGNOSIS — I10 Essential (primary) hypertension: Secondary | ICD-10-CM

## 2022-02-12 DIAGNOSIS — M159 Polyosteoarthritis, unspecified: Secondary | ICD-10-CM

## 2022-02-12 DIAGNOSIS — E782 Mixed hyperlipidemia: Secondary | ICD-10-CM | POA: Diagnosis not present

## 2022-02-12 DIAGNOSIS — R5383 Other fatigue: Secondary | ICD-10-CM | POA: Diagnosis not present

## 2022-02-12 DIAGNOSIS — I2510 Atherosclerotic heart disease of native coronary artery without angina pectoris: Secondary | ICD-10-CM

## 2022-02-12 DIAGNOSIS — I482 Chronic atrial fibrillation, unspecified: Secondary | ICD-10-CM

## 2022-02-12 DIAGNOSIS — M4146 Neuromuscular scoliosis, lumbar region: Secondary | ICD-10-CM | POA: Diagnosis not present

## 2022-02-12 LAB — URINALYSIS, COMPLETE
Bilirubin, UA: NEGATIVE
Glucose, UA: NEGATIVE
Ketones, UA: NEGATIVE
Nitrite, UA: POSITIVE — AB
Protein,UA: NEGATIVE
Specific Gravity, UA: 1.015 (ref 1.005–1.030)
Urobilinogen, Ur: 1 mg/dL (ref 0.2–1.0)
pH, UA: 6.5 (ref 5.0–7.5)

## 2022-02-12 LAB — MICROSCOPIC EXAMINATION
Renal Epithel, UA: NONE SEEN /hpf
WBC, UA: >30 /hpf — AB (ref 0–5)

## 2022-02-12 LAB — BAYER DCA HB A1C WAIVED: HB A1C (BAYER DCA - WAIVED): 6.6 % — ABNORMAL HIGH (ref 4.8–5.6)

## 2022-02-12 NOTE — Patient Instructions (Signed)
High-Protein and High-Calorie Diet Eating high-protein and high-calorie foods can help you to gain weight, heal after an injury, and recover after an illness or surgery. The specific amount of daily protein and calories you need depends on: Your body weight. The reason this diet is recommended for you. Generally, a high-protein, high-calorie diet involves: Eating 250-500 extra calories each day. Making sure that you get enough of your daily calories from protein. Ask your health care provider how many of your calories should come from protein. Talk with a health care provider or a dietitian about how much protein and how many calories you need each day. Follow the diet as directed by your health care provider. What are tips for following this plan?  Reading food labels Check the nutrition facts label for calories, grams of fat and protein. Items with more than 4 grams of protein are high-protein foods. Preparing meals Add whole milk, half-and-half, or heavy cream to cereal, pudding, soup, or hot cocoa. Add whole milk to instant breakfast drinks. Add peanut butter to oatmeal or smoothies. Add powdered milk to baked goods, smoothies, or milkshakes. Add powdered milk, cream, or butter to mashed potatoes. Add cheese to cooked vegetables. Make whole-milk yogurt parfaits. Top them with granola, fruit, or nuts. Add cottage cheese to fruit. Add avocado, cheese, or both to sandwiches or salads. Add avocado to smoothies. Add meat, poultry, or seafood to rice, pasta, casseroles, salads, and soups. Use mayonnaise when making egg salad, chicken salad, or tuna salad. Use peanut butter as a dip for fruits and vegetables or as a topping for pretzels, celery, or crackers. Add beans to casseroles, dips, and spreads. Add pureed beans to sauces and soups. Replace calorie-free drinks with calorie-containing drinks, such as milk and fruit juice. Replace water with milk or heavy cream when making foods such as  oatmeal, pudding, or cocoa. Add oil or butter to cooked vegetables and grains. Add cream cheese to sandwiches or as a topping on crackers and bread. Make cream-based pastas and soups. General information Ask your health care provider if you should take a nutritional supplement. Try to eat six small meals each day instead of three large meals. A general goal is to eat every 2 to 3 hours. Eat a balanced diet. In each meal, include one food that is high in protein and one food with fat in it. Keep nutritious snacks available, such as nuts, trail mixes, dried fruit, and yogurt. If you have kidney disease or diabetes, talk with your health care provider about how much protein is safe for you. Too much protein may put extra stress on your kidneys. Drink your calories. Choose high-calorie drinks and have them after your meals. Consider setting a timer to remind you to eat. You will want to eat even if you do not feel very hungry. What high-protein foods should I eat?  Vegetables Soybeans. Peas. Grains Quinoa. Bulgur wheat. Buckwheat. Meats and other proteins Beef, pork, and poultry. Fish and seafood. Eggs. Tofu. Textured vegetable protein (TVP). Peanut butter. Nuts and seeds. Dried beans. Protein powders. Hummus. Dairy Whole milk. Whole-milk yogurt. Powdered milk. Cheese. Cottage Cheese. Eggnog. Beverages High-protein supplement drinks. Soy milk. Other foods Protein bars. The items listed above may not be a complete list of foods and beverages you can eat and drink. Contact a dietitian for more information. What high-calorie foods should I eat? Fruits Dried fruit. Fruit leather. Canned fruit in syrup. Fruit juice. Avocado. Vegetables Vegetables cooked in oil or butter. Fried potatoes. Grains   Pasta. Quick breads. Muffins. Pancakes. Ready-to-eat cereal. Meats and other proteins Peanut butter. Nuts and seeds. Dairy Heavy cream. Whipped cream. Cream cheese. Sour cream. Ice cream. Custard.  Pudding. Whole milk dairy products. Beverages Meal-replacement beverages. Nutrition shakes. Fruit juice. Seasonings and condiments Salad dressing. Mayonnaise. Alfredo sauce. Fruit preserves or jelly. Honey. Syrup. Sweets and desserts Cake. Cookies. Pie. Pastries. Candy bars. Chocolate. Fats and oils Butter or margarine. Oil. Gravy. Other foods Meal-replacement bars. The items listed above may not be a complete list of foods and beverages you can eat and drink. Contact a dietitian for more information. Summary A high-protein, high-calorie diet can help you gain weight or heal faster after an injury, illness, or surgery. To increase your protein and calories, add ingredients such as whole milk, peanut butter, cheese, beans, meat, or seafood to meal items. To get enough extra calories each day, include high-calorie foods and beverages at each meal. Adding a high-calorie drink or shake can be an easy way to help you get enough calories each day. Talk with your healthcare provider or dietitian about the best options for you. This information is not intended to replace advice given to you by your health care provider. Make sure you discuss any questions you have with your health care provider. Document Revised: 05/08/2020 Document Reviewed: 05/08/2020 Elsevier Patient Education  2023 [employer]  

## 2022-02-12 NOTE — Progress Notes (Signed)
Subjective:    Patient ID: Virginia [name], female    DOB: 27-Sep-1940, 81 y.o.   MRN: [id number]  Chief Complaint  Patient presents with   Follow-up   PT presents to the office today for chronic follow up.  She had seen GI for GI Bleed and was cleared. Continue to avoid NSAID's.   She is currently doing PT.  She is followed by Cardiologists annually for A Fib, CAD, and HTN. Continues to take Eliquis.    Started PPI BID.      02/12/2022   10:37 AM 01/17/2022   11:26 AM 01/03/2022    3:31 PM  Last 3 Weights  Weight (lbs) 139 lb 150 lb 150 lb  Weight (kg) 63.05 kg 68.04 kg 68.04 kg    She has lost 11 lbs since her last visit. Has decrease appetite.   Hypertension This is a chronic problem. The current episode started more than 1 year ago. The problem has been resolved since onset. The problem is controlled. Associated symptoms include malaise/fatigue and peripheral edema. Pertinent negatives include no blurred vision. Risk factors for coronary artery disease include dyslipidemia, obesity and sedentary lifestyle. The current treatment provides moderate improvement. There is no history of heart failure.  Gastroesophageal Reflux She complains of belching and heartburn. This is a chronic problem. The current episode started more than 1 year ago. The problem occurs occasionally. She has tried a PPI for the symptoms. The treatment provided moderate relief.  Diabetes She presents for her follow-up diabetic visit. She has type 2 diabetes mellitus. Pertinent negatives for diabetes include no blurred vision and no foot paresthesias. Symptoms are stable. Pertinent negatives for diabetic complications include no heart disease or peripheral neuropathy. Risk factors for coronary artery disease include dyslipidemia, diabetes mellitus, hypertension, sedentary lifestyle and post-menopausal. She is following a generally healthy diet. (Does not check BS at home) Eye exam is current.  Arthritis Presents  for follow-up visit. She complains of pain and stiffness. Affected locations include the left knee, right knee, left MCP and right MCP. Her pain is at a severity of 5/10.  Anemia Presents for follow-up visit. Symptoms include malaise/fatigue. There has been no bruising/bleeding easily. There is no history of heart failure.  Hyperlipidemia This is a chronic problem. The current episode started more than 1 year ago. The problem is controlled. Recent lipid tests were reviewed and are normal. Current antihyperlipidemic treatment includes statins. The current treatment provides moderate improvement of lipids. Risk factors for coronary artery disease include dyslipidemia, diabetes mellitus, hypertension, a sedentary lifestyle and post-menopausal.      Review of Systems  Constitutional:  Positive for malaise/fatigue.  Eyes:  Negative for blurred vision.  Gastrointestinal:  Positive for heartburn.  Musculoskeletal:  Positive for arthritis and stiffness.  Hematological:  Does not bruise/bleed easily.  All other systems reviewed and are negative.      Objective:   Physical Exam Vitals reviewed.  Constitutional:      General: She is not in acute distress.    Appearance: She is well-developed.  HENT:     Head: Normocephalic and atraumatic.     Right Ear: Tympanic membrane normal.     Left Ear: Tympanic membrane normal.  Eyes:     Pupils: Pupils are equal, round, and reactive to light.  Neck:     Thyroid: No thyromegaly.  Cardiovascular:     Rate and Rhythm: Normal rate and regular rhythm.     Heart sounds: Normal heart sounds. No murmur  heard. Pulmonary:     Effort: Pulmonary effort is normal. No respiratory distress.     Breath sounds: Normal breath sounds. No wheezing.  Abdominal:     General: Bowel sounds are normal. There is no distension.     Palpations: Abdomen is soft.     Tenderness: There is no abdominal tenderness.  Musculoskeletal:        General: No tenderness. Normal  range of motion.     Cervical back: Normal range of motion and neck supple.  Skin:    General: Skin is warm and dry.  Neurological:     Mental Status: She is alert and oriented to person, place, and time.     Cranial Nerves: No cranial nerve deficit.     Deep Tendon Reflexes: Reflexes are normal and symmetric.  Psychiatric:        Behavior: Behavior normal.        Thought Content: Thought content normal.        Judgment: Judgment normal.       BP 128/77   Pulse 91   Temp 97.7 F (36.5 C) (Temporal)   Ht _0  (1.651 m)   Wt 139 lb (63 kg)   SpO2 97%   BMI 23.13 kg/m      Assessment & Plan:   Virginia [name] comes in today with chief complaint of Follow-up   Diagnosis and orders addressed:  1. Chronic atrial fibrillation (HCC) - CMP14+EGFR  2. Coronary artery disease due to lipid rich plaque - CMP14+EGFR  3. Primary hypertension - CMP14+EGFR  4. Gastroesophageal reflux disease, unspecified whether esophagitis present - CMP14+EGFR  5. Type 2 diabetes mellitus with other specified complication, without long-term current use of insulin (HCC) - CMP14+EGFR - Microalbumin / creatinine urine ratio - Bayer DCA Hb A1c Waived  6. Primary osteoarthritis involving multiple joints - CMP14+EGFR  7. Mixed hyperlipidemia - CMP14+EGFR  8. Moderate episode of recurrent major depressive disorder (HCC) - CMP14+EGFR  9. Other fatigue - Anemia Profile B - CMP14+EGFR  10. Symptomatic anemia - Anemia Profile B - CMP14+EGFR  11. Foul smelling urine - Urinalysis, Complete - Urine Culture  12. Weight loss, unintentional If continues to lose weight, will need to add Remeron  Ensure TID with meals High Protein diet   Labs pending Health Maintenance reviewed Diet and exercise encouraged  Follow up plan: 3 months    Evelina Dun, FNP

## 2022-02-13 ENCOUNTER — Other Ambulatory Visit: Payer: Self-pay | Admitting: Family

## 2022-02-13 DIAGNOSIS — I10 Essential (primary) hypertension: Secondary | ICD-10-CM | POA: Diagnosis not present

## 2022-02-13 DIAGNOSIS — I2510 Atherosclerotic heart disease of native coronary artery without angina pectoris: Secondary | ICD-10-CM | POA: Diagnosis not present

## 2022-02-13 DIAGNOSIS — M4146 Neuromuscular scoliosis, lumbar region: Secondary | ICD-10-CM | POA: Diagnosis not present

## 2022-02-13 DIAGNOSIS — I482 Chronic atrial fibrillation, unspecified: Secondary | ICD-10-CM | POA: Diagnosis not present

## 2022-02-13 DIAGNOSIS — M5136 Other intervertebral disc degeneration, lumbar region: Secondary | ICD-10-CM | POA: Diagnosis not present

## 2022-02-13 DIAGNOSIS — M1990 Unspecified osteoarthritis, unspecified site: Secondary | ICD-10-CM | POA: Diagnosis not present

## 2022-02-13 LAB — ANEMIA PROFILE B
Basophils Absolute: 0 10*3/uL (ref 0.0–0.2)
Basos: 1 %
EOS (ABSOLUTE): 0.2 10*3/uL (ref 0.0–0.4)
Eos: 4 %
Ferritin: 134 ng/mL (ref 15–150)
Folate: 18.8 ng/mL (ref >3.0)
Hematocrit: 44.7 % (ref 34.0–46.6)
Hemoglobin: 14.7 g/dL (ref 11.1–15.9)
Immature Grans (Abs): 0 10*3/uL (ref 0.0–0.1)
Immature Granulocytes: 0 %
Iron Saturation: 35 % (ref 15–55)
Iron: 91 ug/dL (ref 27–139)
Lymphocytes Absolute: 2 10*3/uL (ref 0.7–3.1)
Lymphs: 33 %
MCH: 30.6 pg (ref 26.6–33.0)
MCHC: 32.9 g/dL (ref 31.5–35.7)
MCV: 93 fL (ref 79–97)
Monocytes Absolute: 0.7 10*3/uL (ref 0.1–0.9)
Monocytes: 12 %
Neutrophils Absolute: 3.1 10*3/uL (ref 1.4–7.0)
Neutrophils: 50 %
Platelets: 149 10*3/uL — ABNORMAL LOW (ref 150–450)
RBC: 4.81 x10E6/uL (ref 3.77–5.28)
RDW: 13.6 % (ref 11.7–15.4)
Retic Ct Pct: 0.9 % (ref 0.6–2.6)
Total Iron Binding Capacity: 262 ug/dL (ref 250–450)
UIBC: 171 ug/dL (ref 118–369)
Vitamin B-12: 300 pg/mL (ref 232–1245)
WBC: 6.2 10*3/uL (ref 3.4–10.8)

## 2022-02-13 LAB — CMP14+EGFR
ALT: 13 IU/L (ref 0–32)
AST: 27 IU/L (ref 0–40)
Albumin/Globulin Ratio: 2.3 — ABNORMAL HIGH (ref 1.2–2.2)
Albumin: 4.3 g/dL (ref 3.8–4.8)
Alkaline Phosphatase: 120 IU/L (ref 44–121)
BUN/Creatinine Ratio: 16 (ref 12–28)
BUN: 12 mg/dL (ref 8–27)
Bilirubin Total: 1.1 mg/dL (ref 0.0–1.2)
CO2: 29 mmol/L (ref 20–29)
Calcium: 10.2 mg/dL (ref 8.7–10.3)
Chloride: 99 mmol/L (ref 96–106)
Creatinine, Ser: 0.74 mg/dL (ref 0.57–1.00)
Globulin, Total: 1.9 g/dL (ref 1.5–4.5)
Glucose: 132 mg/dL — ABNORMAL HIGH (ref 70–99)
Potassium: 4.2 mmol/L (ref 3.5–5.2)
Sodium: 142 mmol/L (ref 134–144)
Total Protein: 6.2 g/dL (ref 6.0–8.5)
eGFR: 82 mL/min/{1.73_m2} (ref >59)

## 2022-02-13 LAB — MICROALBUMIN / CREATININE URINE RATIO
Creatinine, Urine: 85.1 mg/dL
Microalb/Creat Ratio: 8 mg/g creat (ref 0–29)
Microalbumin, Urine: 7.2 ug/mL

## 2022-02-13 MED ORDER — CEPHALEXIN 500 MG PO CAPS: 500.0000 mg | ORAL_CAPSULE | Freq: Two times a day (BID) | ORAL | 0 refills | Status: DC

## 2022-02-14 DIAGNOSIS — M5136 Other intervertebral disc degeneration, lumbar region: Secondary | ICD-10-CM | POA: Diagnosis not present

## 2022-02-14 DIAGNOSIS — M4146 Neuromuscular scoliosis, lumbar region: Secondary | ICD-10-CM | POA: Diagnosis not present

## 2022-02-14 DIAGNOSIS — I2510 Atherosclerotic heart disease of native coronary artery without angina pectoris: Secondary | ICD-10-CM | POA: Diagnosis not present

## 2022-02-14 DIAGNOSIS — I482 Chronic atrial fibrillation, unspecified: Secondary | ICD-10-CM | POA: Diagnosis not present

## 2022-02-14 DIAGNOSIS — M1990 Unspecified osteoarthritis, unspecified site: Secondary | ICD-10-CM | POA: Diagnosis not present

## 2022-02-14 DIAGNOSIS — I10 Essential (primary) hypertension: Secondary | ICD-10-CM | POA: Diagnosis not present

## 2022-02-15 LAB — URINE CULTURE

## 2022-02-21 DIAGNOSIS — I10 Essential (primary) hypertension: Secondary | ICD-10-CM | POA: Diagnosis not present

## 2022-02-21 DIAGNOSIS — I482 Chronic atrial fibrillation, unspecified: Secondary | ICD-10-CM | POA: Diagnosis not present

## 2022-02-21 DIAGNOSIS — M1990 Unspecified osteoarthritis, unspecified site: Secondary | ICD-10-CM | POA: Diagnosis not present

## 2022-02-21 DIAGNOSIS — M4146 Neuromuscular scoliosis, lumbar region: Secondary | ICD-10-CM | POA: Diagnosis not present

## 2022-02-21 DIAGNOSIS — M5136 Other intervertebral disc degeneration, lumbar region: Secondary | ICD-10-CM | POA: Diagnosis not present

## 2022-02-21 DIAGNOSIS — I2510 Atherosclerotic heart disease of native coronary artery without angina pectoris: Secondary | ICD-10-CM | POA: Diagnosis not present

## 2022-02-26 ENCOUNTER — Other Ambulatory Visit: Payer: Self-pay

## 2022-02-26 DIAGNOSIS — I480 Paroxysmal atrial fibrillation: Secondary | ICD-10-CM

## 2022-02-26 MED ORDER — PAROXETINE HCL 10 MG PO TABS: 10.0000 mg | ORAL_TABLET | Freq: Every morning | ORAL | 1 refills | Status: DC

## 2022-02-26 MED ORDER — APIXABAN 5 MG PO TABS: 5.0000 mg | ORAL_TABLET | Freq: Two times a day (BID) | ORAL | 1 refills | Status: DC

## 2022-02-26 MED ORDER — ATORVASTATIN CALCIUM 80 MG PO TABS: 80.0000 mg | ORAL_TABLET | Freq: Every day | ORAL | 1 refills | Status: DC

## 2022-02-28 ENCOUNTER — Ambulatory Visit (INDEPENDENT_AMBULATORY_CARE_PROVIDER_SITE_OTHER): Payer: Medicare Other

## 2022-02-28 DIAGNOSIS — F331 Major depressive disorder, recurrent, moderate: Secondary | ICD-10-CM

## 2022-02-28 DIAGNOSIS — M5136 Other intervertebral disc degeneration, lumbar region: Secondary | ICD-10-CM | POA: Diagnosis not present

## 2022-02-28 DIAGNOSIS — F0393 Unspecified dementia, unspecified severity, with mood disturbance: Secondary | ICD-10-CM | POA: Diagnosis not present

## 2022-02-28 DIAGNOSIS — M1990 Unspecified osteoarthritis, unspecified site: Secondary | ICD-10-CM | POA: Diagnosis not present

## 2022-02-28 DIAGNOSIS — I10 Essential (primary) hypertension: Secondary | ICD-10-CM

## 2022-02-28 DIAGNOSIS — I2510 Atherosclerotic heart disease of native coronary artery without angina pectoris: Secondary | ICD-10-CM

## 2022-02-28 DIAGNOSIS — I482 Chronic atrial fibrillation, unspecified: Secondary | ICD-10-CM | POA: Diagnosis not present

## 2022-02-28 DIAGNOSIS — E119 Type 2 diabetes mellitus without complications: Secondary | ICD-10-CM | POA: Diagnosis not present

## 2022-02-28 DIAGNOSIS — M4146 Neuromuscular scoliosis, lumbar region: Secondary | ICD-10-CM | POA: Diagnosis not present

## 2022-04-05 ENCOUNTER — Ambulatory Visit: Payer: Medicare Other | Admitting: Cardiology

## 2022-04-10 ENCOUNTER — Ambulatory Visit: Payer: Medicare Other | Admitting: Cardiology

## 2022-04-16 ENCOUNTER — Other Ambulatory Visit: Payer: Self-pay | Admitting: Family

## 2022-04-16 DIAGNOSIS — R600 Localized edema: Secondary | ICD-10-CM

## 2022-04-30 LAB — HM DIABETES EYE EXAM

## 2022-05-02 ENCOUNTER — Ambulatory Visit: Payer: Medicare Other | Admitting: Cardiovascular Disease

## 2022-05-02 ENCOUNTER — Other Ambulatory Visit: Payer: Self-pay | Admitting: Family

## 2022-05-02 DIAGNOSIS — I480 Paroxysmal atrial fibrillation: Secondary | ICD-10-CM

## 2022-05-17 ENCOUNTER — Ambulatory Visit: Payer: Medicare Other | Admitting: Family

## 2022-05-24 IMAGING — CT CT ANGIO CHEST
2 of 7 series · 18 of 46 positions shown · IV contrast (Omnipaque or Isovue)
Comparison: Chest radiographs 08/17/2021

CLINICAL DATA: Shortness of breath. Low oxygen saturation.
Pulmonary embolism suspected.

EXAM:
CT ANGIOGRAPHY CHEST WITH CONTRAST
TECHNIQUE: Multidetector CT imaging of the chest was performed using the
standard protocol during bolus administration of intravenous
contrast. Multiplanar CT image reconstructions and MIPs were
obtained to evaluate the vascular anatomy.

[Series 5: pe axial thins · axial · 0.60mm/px · z∈[+1372,+1618]mm · 15 of 347 slices shown]
[im 20/347  lung]
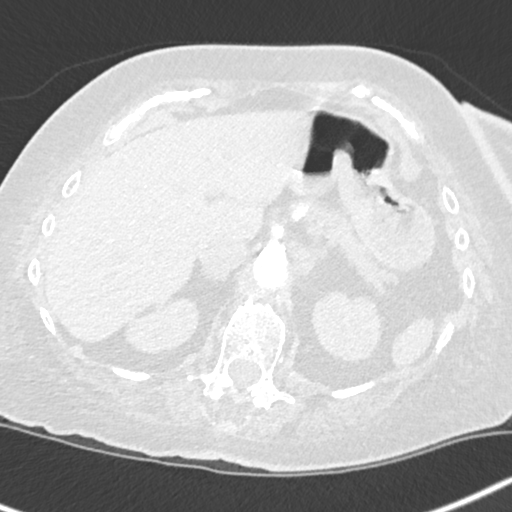
[im 39/347  soft-tissue]
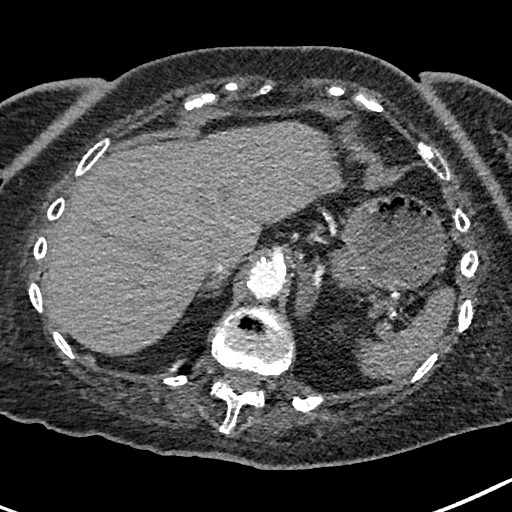
[im 58/347  lung]
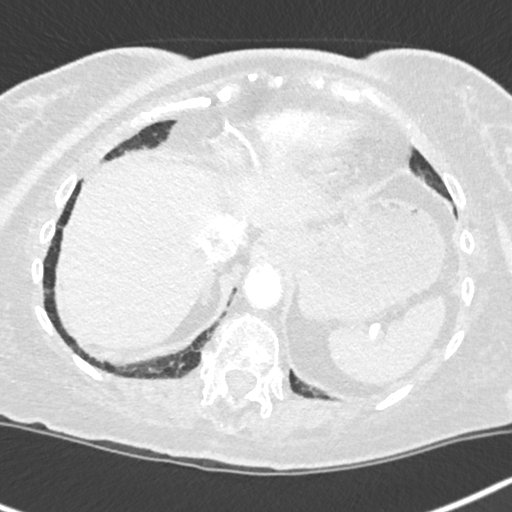
[im 77/347  soft-tissue]
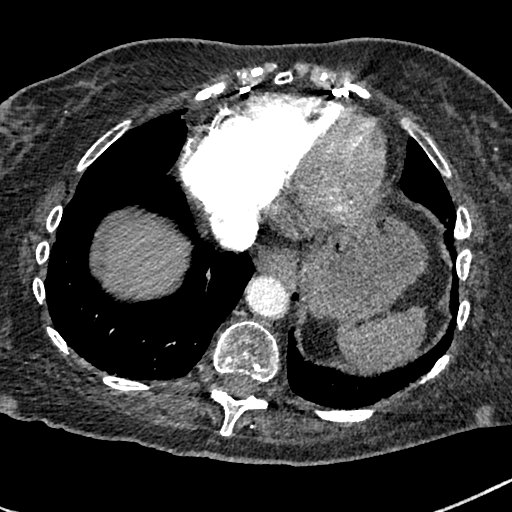
[im 116/347  lung]
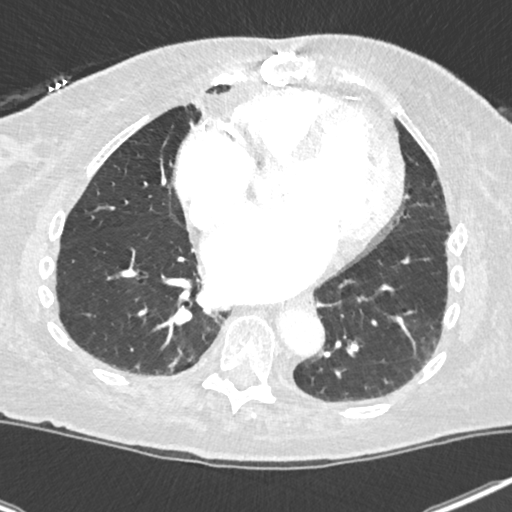
[im 135/347  soft-tissue]
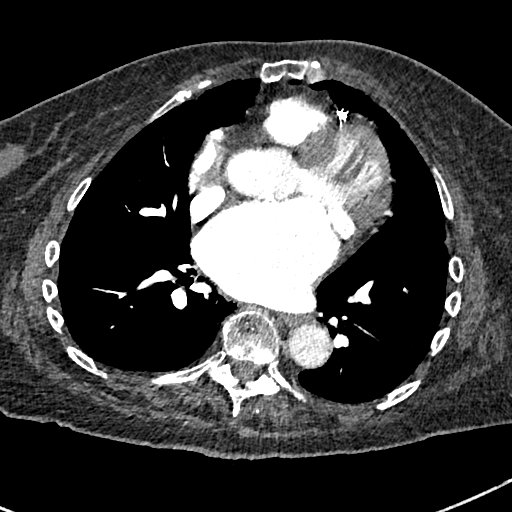
[im 154/347  lung]
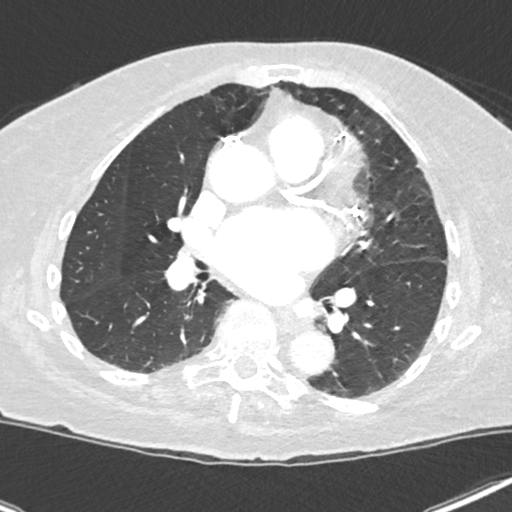
[im 174/347  soft-tissue]
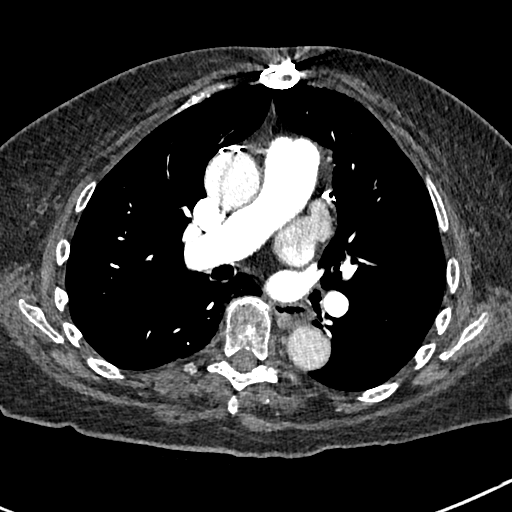
[im 193/347  lung]
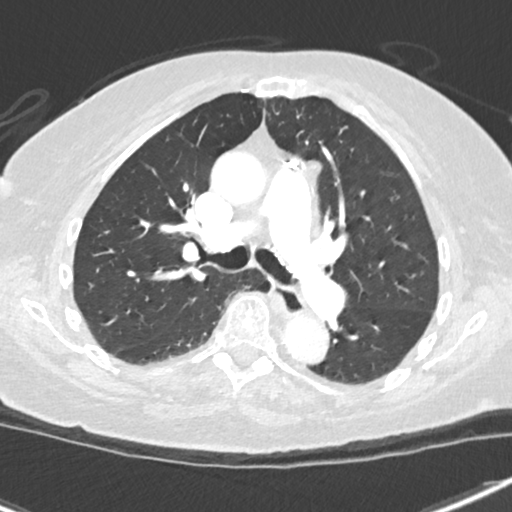
[im 212/347  soft-tissue]
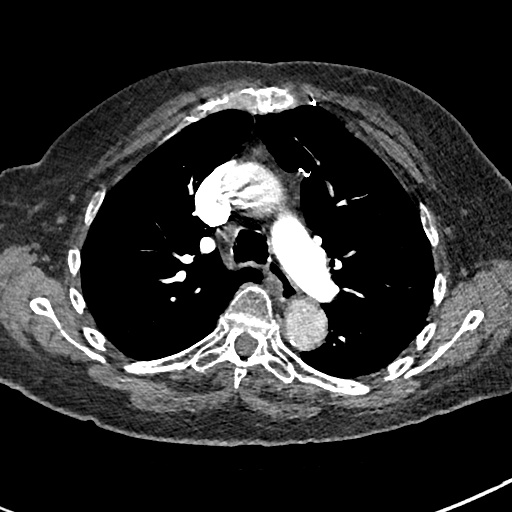
[im 231/347  lung]
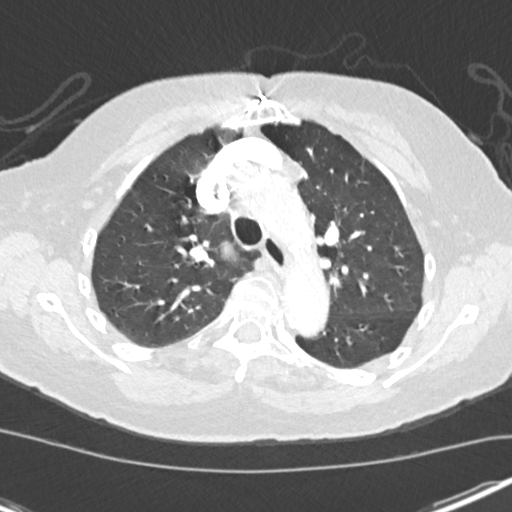
[im 270/347  soft-tissue]
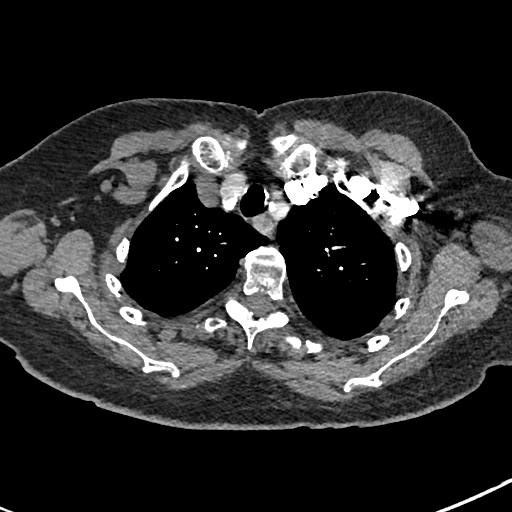
[im 289/347  lung]
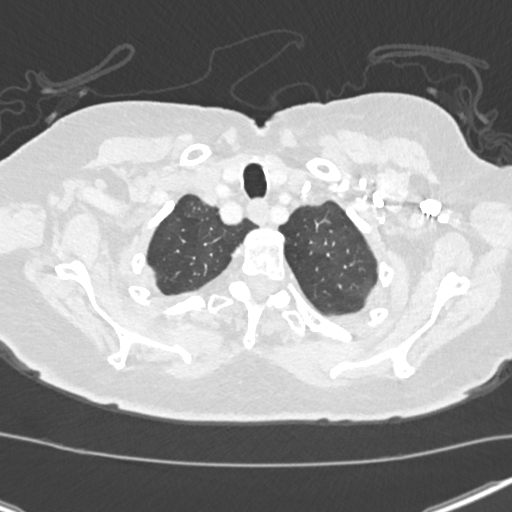
[im 308/347  soft-tissue]
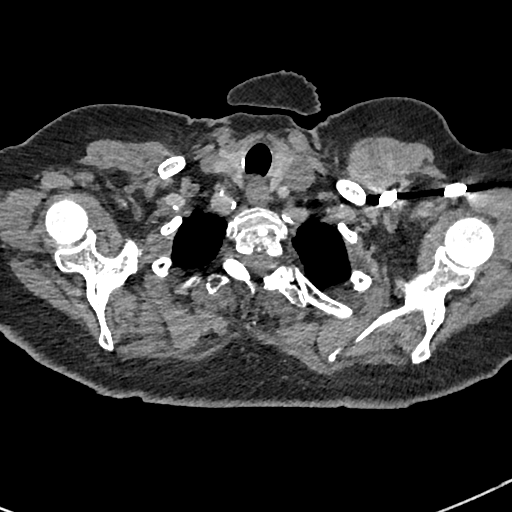
[im 327/347  lung]
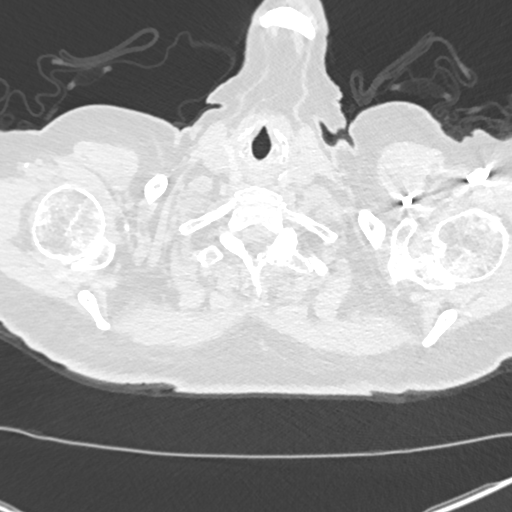

[Series 7: cor soft · coronal · 0.57mm/px · 3 of 151 slices shown]
[im 38/151  soft-tissue]
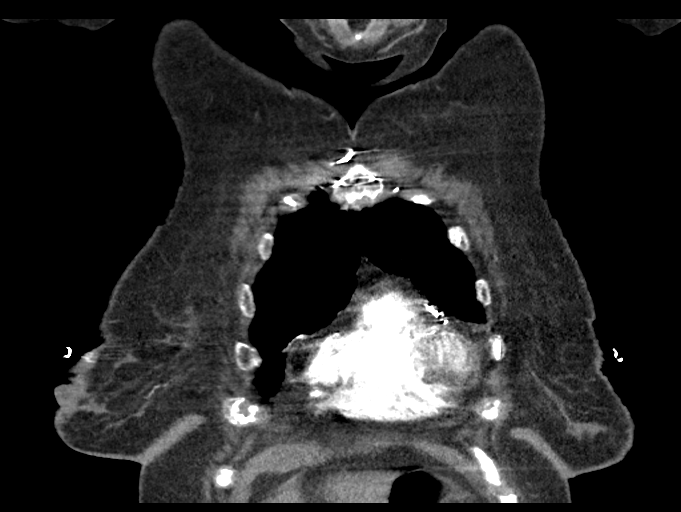
[im 76/151  soft-tissue]
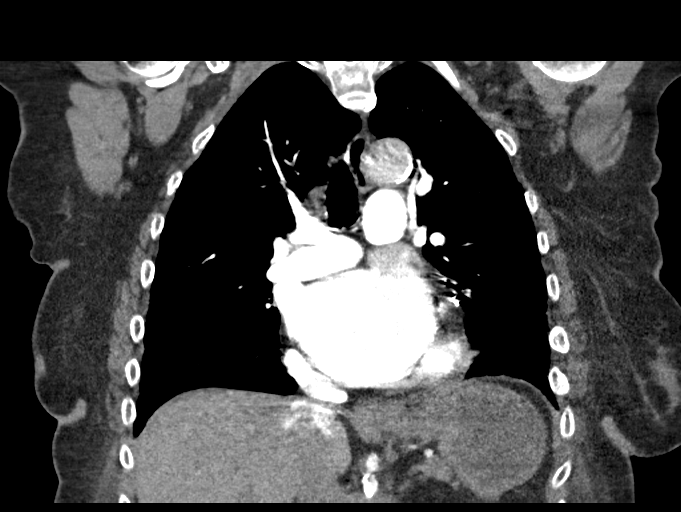
[im 113/151  soft-tissue]
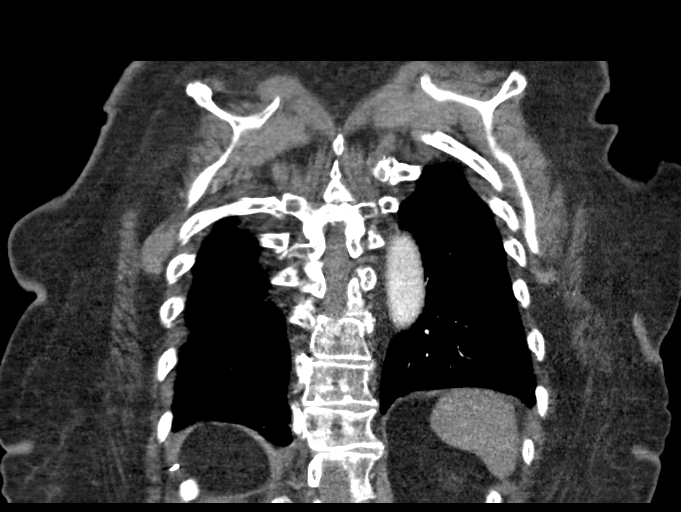

[18 of 46 positions shown; findings below may reference images not displayed]

RADIATION DOSE REDUCTION: This exam was performed according to the
departmental dose-optimization program which includes automated
exposure control, adjustment of the mA and/or kV according to
patient size and/or use of iterative reconstruction technique.

CONTRAST:  80mL OMNIPAQUE IOHEXOL 350 MG/ML SOLN
FINDINGS: Cardiovascular: Pulmonary arterial opacification is adequate without
evidence of emboli within limitations of intermittent motion
artifact. There is thoracic aortic atherosclerosis without aneurysm.
Coronary atherosclerosis and prior CABG are noted. The heart is
enlarged including prominent biatrial enlargement. There is no
pericardial effusion.

Mediastinum/Nodes: No enlarged axillary, mediastinal, or hilar lymph
nodes. Unremarkable thyroid and esophagus. Possible small sliding
hiatal hernia.

Lungs/Pleura: No pleural effusion or pneumothorax. Scattered mild
bilateral lung scarring. No mass. Grossly patent central airways.

Upper Abdomen: No acute abnormality.

Musculoskeletal: Multiple old left rib fractures. Mild T12
compression fracture which is chronic in appearance.

Review of the MIP images confirms the above findings.
IMPRESSION: 1. No evidence of pulmonary emboli or other acute abnormality in the
chest.
2. Cardiomegaly.
3. Aortic Atherosclerosis ([id number]).

## 2022-05-25 IMAGING — DX DG CHEST 1V PORT
1 series · 1 of 1 positions shown · non-contrast
Comparison: Radiograph and chest CTA yesterday.

CLINICAL DATA: Shortness of breath.  Generalized weakness.

EXAM:
PORTABLE CHEST 1 VIEW

[chest ap]
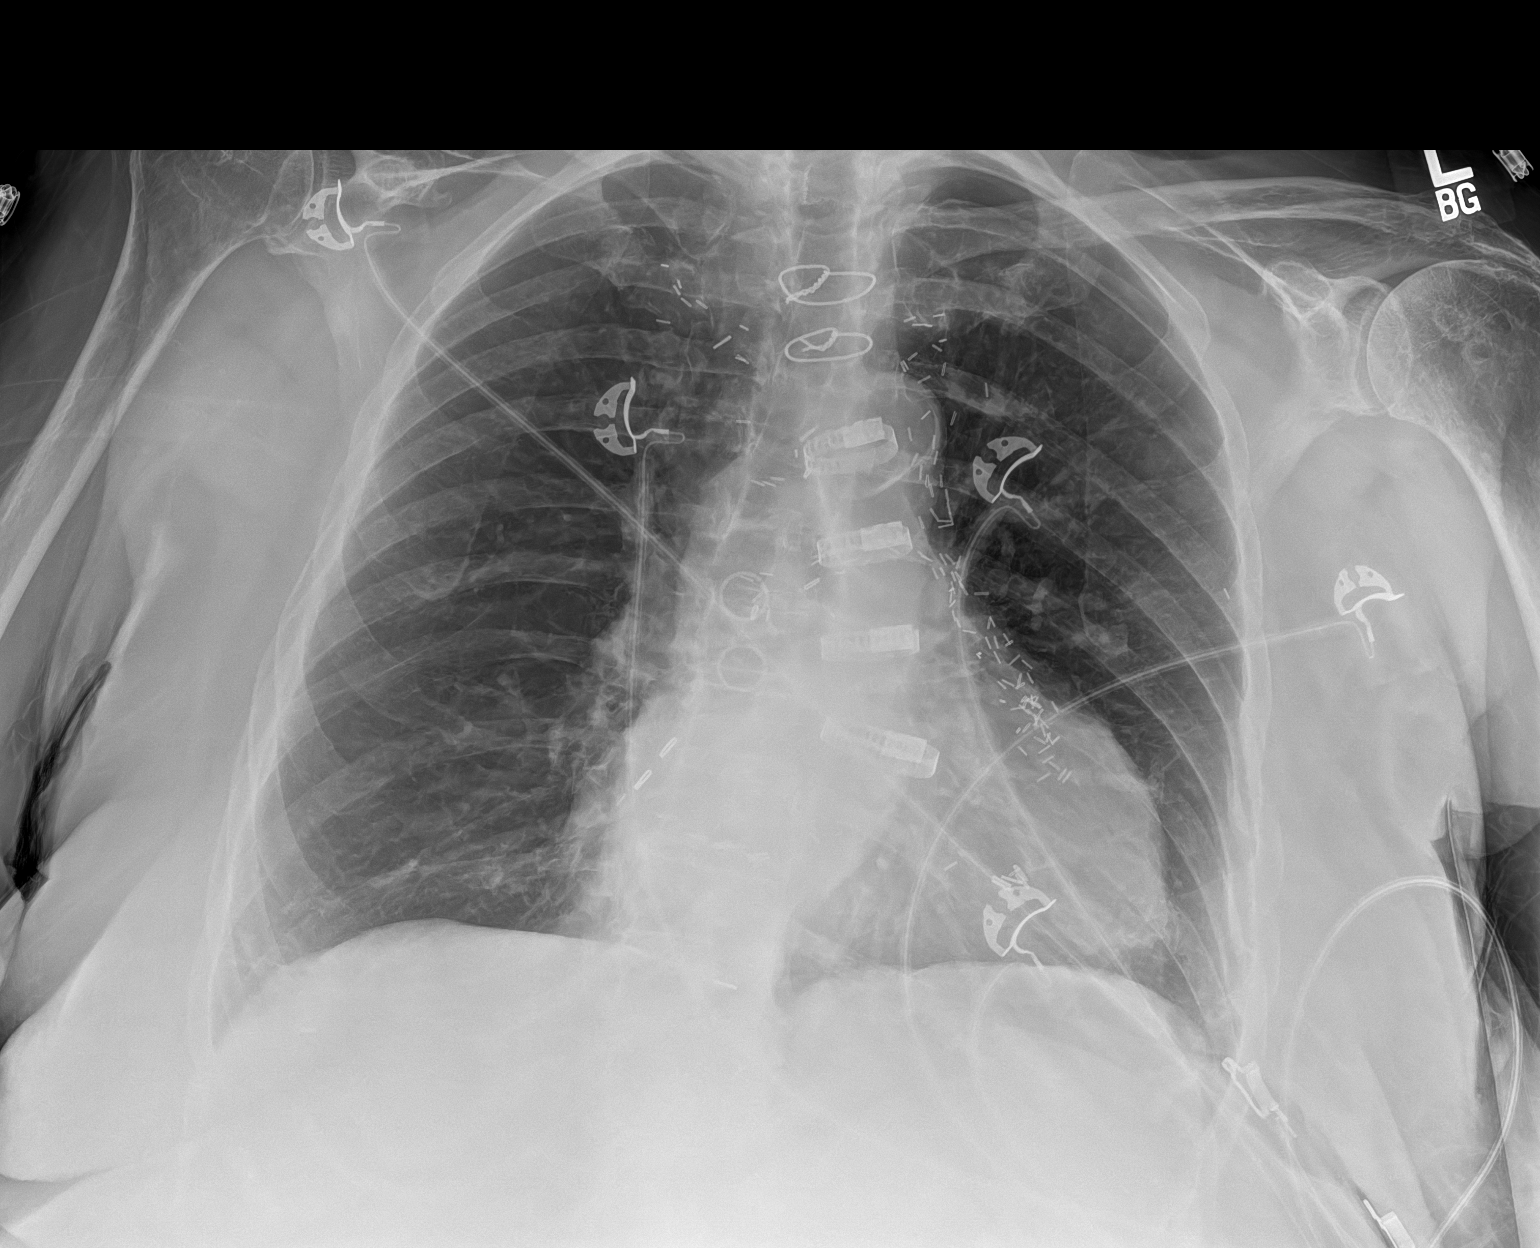

[1 of 1 positions shown; findings below may reference images not displayed]

FINDINGS: Post median sternotomy and CABG. Mild cardiomegaly. Unchanged
mediastinal contours. Aortic atherosclerosis and tortuosity. No
confluent airspace disease, pulmonary edema, pleural effusion or
pneumothorax. Multiple remote left rib fractures. Prior right AC
joint separation. The bones are under mineralized.
IMPRESSION: 1. No acute abnormality or change from radiographs and CT yesterday.
2. Stable mild cardiomegaly.

## 2022-05-29 ENCOUNTER — Other Ambulatory Visit: Payer: Self-pay | Admitting: *Deleted

## 2022-05-29 DIAGNOSIS — I10 Essential (primary) hypertension: Secondary | ICD-10-CM

## 2022-05-29 DIAGNOSIS — R600 Localized edema: Secondary | ICD-10-CM

## 2022-05-29 MED ORDER — LISINOPRIL 20 MG PO TABS: 20.0000 mg | ORAL_TABLET | Freq: Every day | ORAL | 0 refills | Status: DC

## 2022-05-29 MED ORDER — HYDROCHLOROTHIAZIDE 12.5 MG PO CAPS: 12.5000 mg | ORAL_CAPSULE | Freq: Every day | ORAL | 0 refills | Status: DC

## 2022-06-05 ENCOUNTER — Ambulatory Visit (INDEPENDENT_AMBULATORY_CARE_PROVIDER_SITE_OTHER): Payer: Medicare Other | Admitting: Family

## 2022-06-05 ENCOUNTER — Encounter: Payer: Self-pay | Admitting: Family

## 2022-06-05 VITALS — BP 108/70 | HR 58 | Temp 97.7°F | Ht 64.0 in | Wt 147.0 lb

## 2022-06-05 DIAGNOSIS — Z0000 Encounter for general adult medical examination without abnormal findings: Secondary | ICD-10-CM

## 2022-06-05 DIAGNOSIS — Z0001 Encounter for general adult medical examination with abnormal findings: Secondary | ICD-10-CM

## 2022-06-05 DIAGNOSIS — I2583 Coronary atherosclerosis due to lipid rich plaque: Secondary | ICD-10-CM | POA: Diagnosis not present

## 2022-06-05 DIAGNOSIS — I2510 Atherosclerotic heart disease of native coronary artery without angina pectoris: Secondary | ICD-10-CM | POA: Diagnosis not present

## 2022-06-05 DIAGNOSIS — M159 Polyosteoarthritis, unspecified: Secondary | ICD-10-CM

## 2022-06-05 DIAGNOSIS — I10 Essential (primary) hypertension: Secondary | ICD-10-CM

## 2022-06-05 DIAGNOSIS — E782 Mixed hyperlipidemia: Secondary | ICD-10-CM

## 2022-06-05 DIAGNOSIS — G250 Essential tremor: Secondary | ICD-10-CM | POA: Insufficient documentation

## 2022-06-05 DIAGNOSIS — K219 Gastro-esophageal reflux disease without esophagitis: Secondary | ICD-10-CM | POA: Diagnosis not present

## 2022-06-05 DIAGNOSIS — Z8719 Personal history of other diseases of the digestive system: Secondary | ICD-10-CM

## 2022-06-05 DIAGNOSIS — B372 Candidiasis of skin and nail: Secondary | ICD-10-CM

## 2022-06-05 DIAGNOSIS — E1169 Type 2 diabetes mellitus with other specified complication: Secondary | ICD-10-CM | POA: Diagnosis not present

## 2022-06-05 DIAGNOSIS — F331 Major depressive disorder, recurrent, moderate: Secondary | ICD-10-CM

## 2022-06-05 DIAGNOSIS — I482 Chronic atrial fibrillation, unspecified: Secondary | ICD-10-CM

## 2022-06-05 LAB — BAYER DCA HB A1C WAIVED: HB A1C (BAYER DCA - WAIVED): 6.1 % — ABNORMAL HIGH (ref 4.8–5.6)

## 2022-06-05 MED ORDER — NYSTATIN 100000 UNIT/GM EX CREA: 1.0000 | TOPICAL_CREAM | Freq: Two times a day (BID) | CUTANEOUS | 2 refills | Status: DC

## 2022-06-05 MED ORDER — NYSTATIN 100000 UNIT/GM EX POWD: 1.0000 | Freq: Three times a day (TID) | CUTANEOUS | 0 refills | Status: DC

## 2022-06-05 MED ORDER — FLUCONAZOLE 150 MG PO TABS: 150.0000 mg | ORAL_TABLET | ORAL | 0 refills | Status: DC | PRN

## 2022-06-05 NOTE — Patient Instructions (Signed)
Health Maintenance After Age 81 After age 81, you are at a higher risk for certain long-term diseases and infections as well as injuries from falls. Falls are a major cause of broken bones and head injuries in people who are older than age 81. Getting regular preventive care can help to keep you healthy and well. Preventive care includes getting regular testing and making lifestyle changes as recommended by your health care provider. Talk with your health care provider about: Which screenings and tests you should have. A screening is a test that checks for a disease when you have no symptoms. A diet and exercise plan that is right for you. What should I know about screenings and tests to prevent falls? Screening and testing are the best ways to find a health problem early. Early diagnosis and treatment give you the best chance of managing medical conditions that are common after age 81. Certain conditions and lifestyle choices may make you more likely to have a fall. Your health care provider may recommend: Regular vision checks. Poor vision and conditions such as cataracts can make you more likely to have a fall. If you wear glasses, make sure to get your prescription updated if your vision changes. Medicine review. Work with your health care provider to regularly review all of the medicines you are taking, including over-the-counter medicines. Ask your health care provider about any side effects that may make you more likely to have a fall. Tell your health care provider if any medicines that you take make you feel dizzy or sleepy. Strength and balance checks. Your health care provider may recommend certain tests to check your strength and balance while standing, walking, or changing positions. Foot health exam. Foot pain and numbness, as well as not wearing proper footwear, can make you more likely to have a fall. Screenings, including: Osteoporosis screening. Osteoporosis is a condition that causes  the bones to get weaker and break more easily. Blood pressure screening. Blood pressure changes and medicines to control blood pressure can make you feel dizzy. Depression screening. You may be more likely to have a fall if you have a fear of falling, feel depressed, or feel unable to do activities that you used to do. Alcohol use screening. Using too much alcohol can affect your balance and may make you more likely to have a fall. Follow these instructions at home: Lifestyle Do not drink alcohol if: Your health care provider tells you not to drink. If you drink alcohol: Limit how much you have to: 0-1 drink a day for women. 0-2 drinks a day for men. Know how much alcohol is in your drink. In the [place] one drink equals one 12 oz bottle of beer (355 mL), one 5 oz glass of wine (148 mL), or one 1 oz glass of hard liquor (44 mL). Do not use any products that contain nicotine or tobacco. These products include cigarettes, chewing tobacco, and vaping devices, such as e-cigarettes. If you need help quitting, ask your health care provider. Activity  Follow a regular exercise program to stay fit. This will help you maintain your balance. Ask your health care provider what types of exercise are appropriate for you. If you need a cane or walker, use it as recommended by your health care provider. Wear supportive shoes that have nonskid soles. Safety  Remove any tripping hazards, such as rugs, cords, and clutter. Install safety equipment such as grab bars in bathrooms and safety rails on stairs. Keep rooms and walkways   well-lit. General instructions Talk with your health care provider about your risks for falling. Tell your health care provider if: You fall. Be sure to tell your health care provider about all falls, even ones that seem minor. You feel dizzy, tiredness (fatigue), or off-balance. Take over-the-counter and prescription medicines only as told by your health care provider. These include  supplements. Eat a healthy diet and maintain a healthy weight. A healthy diet includes low-fat dairy products, low-fat (lean) meats, and fiber from whole grains, beans, and lots of fruits and vegetables. Stay current with your vaccines. Schedule regular health, dental, and eye exams. Summary Having a healthy lifestyle and getting preventive care can help to protect your health and wellness after age 81. Screening and testing are the best way to find a health problem early and help you avoid having a fall. Early diagnosis and treatment give you the best chance for managing medical conditions that are more common for people who are older than age 81. Falls are a major cause of broken bones and head injuries in people who are older than age 81. Take precautions to prevent a fall at home. Work with your health care provider to learn what changes you can make to improve your health and wellness and to prevent falls. This information is not intended to replace advice given to you by your health care provider. Make sure you discuss any questions you have with your health care provider. Document Revised: 10/24/2020 Document Reviewed: 10/24/2020 Elsevier Patient Education  2023 Elsevier Inc.  

## 2022-06-05 NOTE — Progress Notes (Signed)
Subjective:    Patient ID: Virginia [name], female    DOB: 1940-12-20, 80 y.o.   MRN: [id number]  PT presents to the office today for CPE and chronic follow up.   Virginia [name] had seen GI for GI Bleed and was cleared. Continue to avoid NSAID's.    Virginia [name] is followed by Cardiologists annually for A Fib, CAD, and HTN. Continues to take Eliquis.  Hypertension This is a chronic problem. The current episode started more than 1 year ago. The problem has been resolved since onset. The problem is controlled. Pertinent negatives include no blurred vision, malaise/fatigue, peripheral edema or shortness of breath. Risk factors for coronary artery disease include diabetes mellitus, dyslipidemia and sedentary lifestyle. The current treatment provides moderate improvement. Hypertensive end-organ damage includes CAD/MI.  Gastroesophageal Reflux Virginia [name] complains of belching and heartburn. This is a chronic problem. The current episode started more than 1 year ago. The problem occurs occasionally. The problem has been waxing and waning. Virginia [name] has tried a PPI for the symptoms. The treatment provided moderate relief.  Diabetes Virginia [name] presents for her follow-up diabetic visit. Virginia [name] has type 2 diabetes mellitus. Pertinent negatives for diabetes include no blurred vision and no foot paresthesias. Symptoms are stable. Diabetic complications include heart disease. Risk factors for coronary artery disease include diabetes mellitus, dyslipidemia, hypertension and sedentary lifestyle. Virginia [name] is following a generally healthy diet. (Does not check BS at home) Eye exam is current.  Arthritis Presents for follow-up visit. Virginia [name] complains of pain and stiffness. The symptoms have been stable. Affected locations include the left knee and right knee. Her pain is at a severity of 6/10. Associated symptoms include rash.  Depression        This is a chronic problem.  The current episode started more than 1 year ago.   The onset quality is gradual.   The  problem occurs intermittently.  Associated symptoms include no helplessness, no hopelessness and not sad.  Past treatments include SSRIs - Selective serotonin reuptake inhibitors. Hyperlipidemia This is a chronic problem. The current episode started more than 1 year ago. The problem is controlled. Pertinent negatives include no shortness of breath. Current antihyperlipidemic treatment includes statins. The current treatment provides moderate improvement of lipids. Risk factors for coronary artery disease include dyslipidemia, hypertension and a sedentary lifestyle.  Rash This is a new problem. The current episode started more than 1 month ago. The problem has been waxing and waning since onset. The affected locations include the abdomen and groin (lower abdomen). The rash is characterized by pain and itchiness. Pertinent negatives include no shortness of breath.      Review of Systems  Constitutional:  Negative for malaise/fatigue.  Eyes:  Negative for blurred vision.  Respiratory:  Negative for shortness of breath.   Gastrointestinal:  Positive for heartburn.  Musculoskeletal:  Positive for arthritis and stiffness.  Skin:  Positive for rash.  Psychiatric/Behavioral:  Positive for depression.   All other systems reviewed and are negative.  Family History  Problem Relation Age of Onset   Stroke Mother    Heart attack Son    Social History   Socioeconomic History   Marital status: Widowed    Spouse name: Not on file   Number of children: 3   Years of education: 12   Highest education level: High school graduate  Occupational History   Occupation: retired  Tobacco Use   Smoking status: Never   Smokeless tobacco: Never  Vaping Use   Vaping Use: Never  used  Substance and Sexual Activity   Alcohol use: No   Drug use: No   Sexual activity: Not Currently    Birth control/protection: Post-menopausal  Other Topics Concern   Not on file  Social History Narrative   Not on file    Social Determinants of Health   Financial Resource Strain: Low Risk  (01/03/2022)   Overall Financial Resource Strain (CARDIA)    Difficulty of Paying Living Expenses: Not hard at all  Food Insecurity: No Food Insecurity (01/03/2022)   Hunger Vital Sign    Worried About Running Out of Food in the Last Year: Never true    Ran Out of Food in the Last Year: Never true  Transportation Needs: No Transportation Needs (01/03/2022)   PRAPARE - Hydrologist (Medical): No    Lack of Transportation (Non-Medical): No  Physical Activity: Inactive (01/03/2022)   Exercise Vital Sign    Days of Exercise per Week: 0 days    Minutes of Exercise per Session: 0 min  Stress: No Stress Concern Present (01/03/2022)   [place]    Feeling of Stress : Not at all  Social Connections: Moderately Integrated (01/03/2022)   Social Connection and Isolation Panel [NHANES]    Frequency of Communication with Friends and Family: More than three times a week    Frequency of Social Gatherings with Friends and Family: More than three times a week    Attends Religious Services: More than 4 times per year    Active Member of [employer] or Organizations: Yes    Attends Archivist Meetings: More than 4 times per year    Marital Status: Widowed       Objective:   Physical Exam Vitals reviewed.  Constitutional:      General: Virginia [name] is not in acute distress.    Appearance: Virginia [name] is well-developed.  HENT:     Head: Normocephalic and atraumatic.     Right Ear: Tympanic membrane normal.     Left Ear: Tympanic membrane normal.  Eyes:     Pupils: Pupils are equal, round, and reactive to light.  Neck:     Thyroid: No thyromegaly.  Cardiovascular:     Rate and Rhythm: Normal rate and regular rhythm.     Heart sounds: Normal heart sounds. No murmur heard. Pulmonary:     Effort: Pulmonary effort is normal. No respiratory  distress.     Breath sounds: Normal breath sounds. No wheezing.  Abdominal:     General: Bowel sounds are normal. There is no distension.     Palpations: Abdomen is soft.     Tenderness: There is no abdominal tenderness.  Musculoskeletal:        General: No tenderness. Normal range of motion.     Cervical back: Normal range of motion and neck supple.  Skin:    General: Skin is warm and dry.     Findings: Rash present.          Comments: Erythemas rash under left abdominal   Neurological:     Mental Status: Virginia [name] is alert and oriented to person, place, and time.     Cranial Nerves: No cranial nerve deficit.     Motor: Tremor present.     Deep Tendon Reflexes: Reflexes are normal and symmetric.  Psychiatric:        Behavior: Behavior normal.        Thought Content: Thought content normal.  Judgment: Judgment normal.       BP (!) 149/75   Pulse (!) 58   Temp 97.7 F (36.5 C) (Temporal)   Ht _0  (1.626 m)   Wt 147 lb (66.7 kg)   SpO2 95%   BMI 25.23 kg/m      Assessment & Plan:  LUEVENIA MCAVOY comes in today with chief complaint of Medical Management of Chronic Issues   Diagnosis and orders addressed:  1. Coronary artery disease due to lipid rich plaque - CMP14+EGFR - CBC with Differential/Platelet  2. Chronic atrial fibrillation (HCC) - CMP14+EGFR - CBC with Differential/Platelet  3. Gastroesophageal reflux disease, unspecified whether esophagitis present - CMP14+EGFR - CBC with Differential/Platelet  4. H/O: GI bleed - CMP14+EGFR - CBC with Differential/Platelet  5. Mixed hyperlipidemia - CMP14+EGFR - CBC with Differential/Platelet  6. Moderate episode of recurrent major depressive disorder (HCC) - CMP14+EGFR - CBC with Differential/Platelet  7. Primary osteoarthritis involving multiple joints - CMP14+EGFR - CBC with Differential/Platelet  8. Primary hypertension - CMP14+EGFR - CBC with Differential/Platelet  9. Type 2 diabetes  mellitus with other specified complication, without long-term current use of insulin (HCC) - CMP14+EGFR - CBC with Differential/Platelet - Bayer DCA Hb A1c Waived  10. Cutaneous candidiasis Start diflucan  Nystatin powder and cream  Keep clean and dry - nystatin (MYCOSTATIN/NYSTOP) powder; Apply 1 Application topically 3 (three) times daily.  Dispense: 15 g; Refill: 0 - fluconazole (DIFLUCAN) 150 MG tablet; Take 1 tablet (150 mg total) by mouth every three (3) days as needed.  Dispense: 3 tablet; Refill: 0 - nystatin cream (MYCOSTATIN); Apply 1 Application topically 2 (two) times daily.  Dispense: 60 g; Refill: 2 - CMP14+EGFR - CBC with Differential/Platelet  11. Essential tremor - CMP14+EGFR - CBC with Differential/Platelet  12. Annual physical exam - CMP14+EGFR - CBC with Differential/Platelet - Bayer DCA Hb A1c Waived - TSH   Labs pending Health Maintenance reviewed-refuses all vaccines Diet and exercise encouraged  Follow up plan: 4 months    Evelina Dun, FNP

## 2022-06-06 LAB — CBC WITH DIFFERENTIAL/PLATELET
Basophils Absolute: 0 10*3/uL (ref 0.0–0.2)
Basos: 1 %
EOS (ABSOLUTE): 0.1 10*3/uL (ref 0.0–0.4)
Eos: 2 %
Hematocrit: 39 % (ref 34.0–46.6)
Hemoglobin: 13.2 g/dL (ref 11.1–15.9)
Immature Grans (Abs): 0 10*3/uL (ref 0.0–0.1)
Immature Granulocytes: 0 %
Lymphocytes Absolute: 1.4 10*3/uL (ref 0.7–3.1)
Lymphs: 25 %
MCH: 32.3 pg (ref 26.6–33.0)
MCHC: 33.8 g/dL (ref 31.5–35.7)
MCV: 95 fL (ref 79–97)
Monocytes Absolute: 0.6 10*3/uL (ref 0.1–0.9)
Monocytes: 10 %
Neutrophils Absolute: 3.4 10*3/uL (ref 1.4–7.0)
Neutrophils: 62 %
Platelets: 138 10*3/uL — ABNORMAL LOW (ref 150–450)
RBC: 4.09 x10E6/uL (ref 3.77–5.28)
RDW: 11.6 % — ABNORMAL LOW (ref 11.7–15.4)
WBC: 5.6 10*3/uL (ref 3.4–10.8)

## 2022-06-06 LAB — CMP14+EGFR
ALT: 17 IU/L (ref 0–32)
AST: 24 IU/L (ref 0–40)
Albumin/Globulin Ratio: 2.3 — ABNORMAL HIGH (ref 1.2–2.2)
Albumin: 4.3 g/dL (ref 3.7–4.7)
Alkaline Phosphatase: 98 IU/L (ref 44–121)
BUN/Creatinine Ratio: 13 (ref 12–28)
BUN: 10 mg/dL (ref 8–27)
Bilirubin Total: 1.4 mg/dL — ABNORMAL HIGH (ref 0.0–1.2)
CO2: 25 mmol/L (ref 20–29)
Calcium: 10.1 mg/dL (ref 8.7–10.3)
Chloride: 100 mmol/L (ref 96–106)
Creatinine, Ser: 0.78 mg/dL (ref 0.57–1.00)
Globulin, Total: 1.9 g/dL (ref 1.5–4.5)
Glucose: 117 mg/dL — ABNORMAL HIGH (ref 70–99)
Potassium: 4 mmol/L (ref 3.5–5.2)
Sodium: 140 mmol/L (ref 134–144)
Total Protein: 6.2 g/dL (ref 6.0–8.5)
eGFR: 76 mL/min/{1.73_m2} (ref >59)

## 2022-06-06 LAB — TSH: TSH: 2.52 u[IU]/mL (ref 0.450–4.500)

## 2022-06-14 ENCOUNTER — Other Ambulatory Visit: Payer: Self-pay | Admitting: Family

## 2022-07-11 ENCOUNTER — Other Ambulatory Visit: Payer: Self-pay | Admitting: Family

## 2022-07-11 DIAGNOSIS — I480 Paroxysmal atrial fibrillation: Secondary | ICD-10-CM

## 2022-07-12 ENCOUNTER — Other Ambulatory Visit: Payer: Self-pay | Admitting: Family

## 2022-07-12 DIAGNOSIS — R600 Localized edema: Secondary | ICD-10-CM

## 2022-07-16 ENCOUNTER — Other Ambulatory Visit: Payer: Self-pay | Admitting: Family

## 2022-07-17 ENCOUNTER — Ambulatory Visit: Payer: Medicare Other | Attending: Cardiology | Admitting: Cardiovascular Disease

## 2022-07-17 ENCOUNTER — Encounter: Payer: Self-pay | Admitting: Cardiovascular Disease

## 2022-07-17 VITALS — BP 130/70 | HR 94 | Ht 64.0 in | Wt 150.8 lb

## 2022-07-17 DIAGNOSIS — I2510 Atherosclerotic heart disease of native coronary artery without angina pectoris: Secondary | ICD-10-CM

## 2022-07-17 DIAGNOSIS — E782 Mixed hyperlipidemia: Secondary | ICD-10-CM | POA: Diagnosis not present

## 2022-07-17 DIAGNOSIS — I10 Essential (primary) hypertension: Secondary | ICD-10-CM

## 2022-07-17 DIAGNOSIS — I2583 Coronary atherosclerosis due to lipid rich plaque: Secondary | ICD-10-CM | POA: Diagnosis not present

## 2022-07-17 DIAGNOSIS — I482 Chronic atrial fibrillation, unspecified: Secondary | ICD-10-CM

## 2022-07-17 NOTE — Progress Notes (Signed)
07/17/2022 Virginia [name]   1941-02-07  [id number]  Primary Physician Sharion Balloon, FNP Primary Cardiologist: Lorretta Harp MD [name], [place]  HPI:  Virginia [name] is a 82 y.o.  mildly to moderately overweight divorced Caucasian female, mother of 3 children, grandmother to 4 grandchildren, who I last spoke to him and found for a telemedicine visit 11/26/2018.  She is accompanied by her younger Morton Peters today.  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 Her other problems include hypertension, hyperlipidemia, insulin-dependent diabetes. She has had bilateral knee replacements by Dr. Luna Glasgow in the past. She denies chest pain or shortness of breath. Her last functional study performed in November 2012 was nonischemic. She had a renal ultrasound to rule out renovascular hypertension, which showed moderate right renal artery stenosis.   Since spoke to her on the phone 2 and half years ago she continues to do well.  She remains in chronic A-fib rate controlled on Eliquis oral anticoagulation.  She does live with one of her daughters who has Downs syndrome.  She does not drive.  She gets out of the house infrequently.  She denies chest pain or shortness of breath.   No outpatient medications have been marked as taking for the 07/17/22 encounter (Office Visit) with Lorretta Harp, MD.     No Known Allergies  Social History   Socioeconomic History   Marital status: Widowed    Spouse name: Not on file   Number of children: 3   Years of education: 12   Highest education level: High school graduate  Occupational History   Occupation: retired  Tobacco Use   Smoking status: Never   Smokeless tobacco: Never  Vaping Use   Vaping Use: Never used  Substance and Sexual Activity   Alcohol use: No   Drug use: No   Sexual activity: Not  Currently    Birth control/protection: Post-menopausal  Other Topics Concern   Not on file  Social History Narrative   Not on file   Social Determinants of Health   Financial Resource Strain: Low Risk  (01/03/2022)   Overall Financial Resource Strain (CARDIA)    Difficulty of Paying Living Expenses: Not hard at all  Food Insecurity: No Food Insecurity (01/03/2022)   Hunger Vital Sign    Worried About Running Out of Food in the Last Year: Never true    [place] in the Last Year: Never true  Transportation Needs: No Transportation Needs (01/03/2022)   PRAPARE - Hydrologist (Medical): No    Lack of Transportation (Non-Medical): No  Physical Activity: Inactive (01/03/2022)   Exercise Vital Sign    Days of Exercise per Week: 0 days    Minutes of Exercise per Session: 0 min  Stress: No Stress Concern Present (01/03/2022)   [place]    Feeling of Stress : Not at all  Social Connections: Moderately Integrated (01/03/2022)   Social Connection and Isolation Panel [NHANES]    Frequency of Communication with Friends and Family: More than three times a week    Frequency of Social Gatherings with Friends and Family: More than three times a week    Attends Religious Services: More than 4 times per year    Active Member of [employer]  or Organizations: Yes    Attends Archivist Meetings: More than 4 times per year    Marital Status: Widowed  Intimate Partner Violence: Not At Risk (01/03/2022)   Humiliation, Afraid, Rape, and Kick questionnaire    Fear of Current or Ex-Partner: No    Emotionally Abused: No    Physically Abused: No    Sexually Abused: No     Review of Systems: General: negative for chills, fever, night sweats or weight changes.  Cardiovascular: negative for chest pain, dyspnea on exertion, edema, orthopnea, palpitations, paroxysmal nocturnal dyspnea or shortness of  breath Dermatological: negative for rash Respiratory: negative for cough or wheezing Urologic: negative for hematuria Abdominal: negative for nausea, vomiting, diarrhea, bright red blood per rectum, melena, or hematemesis Neurologic: negative for visual changes, syncope, or dizziness All other systems reviewed and are otherwise negative except as noted above.    Blood pressure 130/70, pulse 94, height 5\' 4"  (1.626 m), weight 150 lb 12.8 oz (68.4 kg), SpO2 95 %.  General appearance: alert and no distress Neck: no adenopathy, no carotid bruit, no JVD, supple, symmetrical, trachea midline, and thyroid not enlarged, symmetric, no tenderness/mass/nodules Lungs: clear to auscultation bilaterally Heart: irregularly irregular rhythm Extremities: extremities normal, atraumatic, no cyanosis or edema Pulses: 2+ and symmetric Skin: Skin color, texture, turgor normal. No rashes or lesions Neurologic: Grossly normal  EKG atrial fibrillation with ventricular sponsor 94, left posterior fascicular block, septal Q waves.  I personally reviewed this EKG.  ASSESSMENT AND PLAN:   CAD (coronary artery disease) History of CAD status post coronary artery bypass grafting x 3 in 1999.  She denies chest pain or shortness of breath.  Primary hypertension History of essential hypertension blood pressure measured today 130/70.  She is on lisinopril, hydrochlorothiazide and metoprolol.  Hyperlipidemia History of hyperlipidemia on statin therapy with lipid profile performed 07/16/2019 revealing total cholesterol 128, LDL 42 and HDL 72.  Chronic atrial fibrillation (HCC) History of chronic A-fib rate controlled on Eliquis oral anticoagulation.     Lorretta Harp MD FACP,FACC,FAHA, [facility] 07/17/2022 10:43 AM

## 2022-07-17 NOTE — Assessment & Plan Note (Signed)
History of chronic A. fib rate controlled on Eliquis oral anticoagulation 

## 2022-07-17 NOTE — Assessment & Plan Note (Signed)
History of essential hypertension blood pressure measured today 130/70.  She is on lisinopril, hydrochlorothiazide and metoprolol.

## 2022-07-17 NOTE — Assessment & Plan Note (Signed)
History of CAD status post coronary artery bypass grafting x 3 in 1999.  She denies chest pain or shortness of breath.

## 2022-07-17 NOTE — Patient Instructions (Signed)

## 2022-07-17 NOTE — Assessment & Plan Note (Signed)
History of hyperlipidemia on statin therapy with lipid profile performed 07/16/2019 revealing total cholesterol 128, LDL 42 and HDL 72.

## 2022-07-21 ENCOUNTER — Other Ambulatory Visit (INDEPENDENT_AMBULATORY_CARE_PROVIDER_SITE_OTHER): Payer: Self-pay | Admitting: Gastroenterology

## 2022-08-07 ENCOUNTER — Ambulatory Visit: Payer: Medicare Other | Admitting: Orthopaedic Surgery

## 2022-08-07 ENCOUNTER — Encounter: Payer: Self-pay | Admitting: Orthopaedic Surgery

## 2022-08-07 ENCOUNTER — Other Ambulatory Visit: Payer: Self-pay | Admitting: Family

## 2022-08-07 DIAGNOSIS — G8929 Other chronic pain: Secondary | ICD-10-CM | POA: Diagnosis not present

## 2022-08-07 DIAGNOSIS — M25562 Pain in left knee: Secondary | ICD-10-CM

## 2022-08-07 DIAGNOSIS — R600 Localized edema: Secondary | ICD-10-CM

## 2022-08-07 NOTE — Progress Notes (Signed)
PROCEDURE NOTE:  The patient requests injections of the left knee , verbal consent was obtained.  The left knee was prepped appropriately after time out was performed.   Sterile technique was observed and injection of 1 cc of DepoMedrol 40 mg with several cc's of plain xylocaine. Anesthesia was provided by ethyl chloride and a 20-gauge needle was used to inject the knee area. The injection was tolerated well.  A band aid dressing was applied.  The patient was advised to apply ice later today and tomorrow to the injection sight as needed.  Encounter Diagnosis  Name Primary?   Chronic pain of left knee Yes   Return prn. Call if any problem.  Precautions discussed.  Electronically Signed Sanjuana Kava, MD 2/20/202410:36 AM

## 2022-08-08 ENCOUNTER — Other Ambulatory Visit: Payer: Self-pay | Admitting: Family

## 2022-08-08 DIAGNOSIS — I10 Essential (primary) hypertension: Secondary | ICD-10-CM

## 2022-08-10 ENCOUNTER — Ambulatory Visit (INDEPENDENT_AMBULATORY_CARE_PROVIDER_SITE_OTHER): Payer: Medicare Other | Admitting: Family

## 2022-08-10 ENCOUNTER — Encounter: Payer: Self-pay | Admitting: Family

## 2022-08-10 VITALS — BP 146/92 | HR 105 | Temp 97.0°F | Ht 64.0 in | Wt 155.6 lb

## 2022-08-10 DIAGNOSIS — R609 Edema, unspecified: Secondary | ICD-10-CM | POA: Diagnosis not present

## 2022-08-10 DIAGNOSIS — R600 Localized edema: Secondary | ICD-10-CM | POA: Diagnosis not present

## 2022-08-10 DIAGNOSIS — I10 Essential (primary) hypertension: Secondary | ICD-10-CM | POA: Diagnosis not present

## 2022-08-10 MED ORDER — FUROSEMIDE 20 MG PO TABS: 20.0000 mg | ORAL_TABLET | Freq: Every day | ORAL | 3 refills | Status: DC

## 2022-08-10 NOTE — Patient Instructions (Signed)
Peripheral Edema  Peripheral edema is swelling that is caused by a buildup of fluid. Peripheral edema most often affects the lower legs, ankles, and feet. It can also develop in the arms, hands, and face. The area of the body that has peripheral edema will look swollen. It may also feel heavy or warm. Your clothes may start to feel tight. Pressing on the area may make a temporary dent in your skin (pitting edema). You may not be able to move your swollen arm or leg as much as usual. There are many causes of peripheral edema. It can happen because of a complication of other conditions such as heart failure, kidney disease, or a problem with your circulation. It also can be a side effect of certain medicines or happen because of an infection. It often happens to women during pregnancy. Sometimes, the cause is not known. Follow these instructions at home: Managing pain, stiffness, and swelling  Raise (elevate) your legs while you are sitting or lying down. Move around often to prevent stiffness and to reduce swelling. Do not sit or stand for long periods of time. Do not wear tight clothing. Do not wear garters on your upper legs. Exercise your legs to get your circulation going. This helps to move the fluid back into your blood vessels, and it may help the swelling go down. Wear compression stockings as told by your health care provider. These stockings help to prevent blood clots and reduce swelling in your legs. It is important that these are the correct size. These stockings should be prescribed by your doctor to prevent possible injuries. If elastic bandages or wraps are recommended, use them as told by your health care provider. Medicines Take over-the-counter and prescription medicines only as told by your health care provider. Your health care provider may prescribe medicine to help your body get rid of excess water (diuretic). Take this medicine if you are told to take it. General  instructions Eat a low-salt (low-sodium) diet as told by your health care provider. Sometimes, eating less salt may reduce swelling. Pay attention to any changes in your symptoms. Moisturize your skin daily to help prevent skin from cracking and draining. Keep all follow-up visits. This is important. Contact a health care provider if: You have a fever. You have swelling in only one leg. You have increased swelling, redness, or pain in one or both of your legs. You have drainage or sores at the area where you have edema. Get help right away if: You have edema that starts suddenly or is getting worse, especially if you are pregnant or have a medical condition. You develop shortness of breath, especially when you are lying down. You have pain in your chest or abdomen. You feel weak. You feel like you will faint. These symptoms may be an emergency. Get help right away. Call 911. Do not wait to see if the symptoms will go away. Do not drive yourself to the hospital. Summary Peripheral edema is swelling that is caused by a buildup of fluid. Peripheral edema most often affects the lower legs, ankles, and feet. Move around often to prevent stiffness and to reduce swelling. Do not sit or stand for long periods of time. Pay attention to any changes in your symptoms. Contact a health care provider if you have edema that starts suddenly or is getting worse, especially if you are pregnant or have a medical condition. Get help right away if you develop shortness of breath, especially when lying down.   This information is not intended to replace advice given to you by your health care provider. Make sure you discuss any questions you have with your health care provider. Document Revised: 02/06/2021 Document Reviewed: 02/06/2021 Elsevier Patient Education  2023 Elsevier Inc.  

## 2022-08-10 NOTE — Progress Notes (Signed)
   Subjective:    Patient ID: Virginia [name], female    DOB: 04-23-1941, 82 y.o.   MRN: [id number]  Chief Complaint  Patient presents with   Edema    Bilateral ankle and legs for the last couple of weeks    Pt presents to the office today bilateral leg swelling and erythemas that started two weeks ago. Reports mild pain in bilateral feet of 8 out 10. Denies any fever.  Hypertension This is a chronic problem. The current episode started more than 1 year ago. The problem is uncontrolled. Associated symptoms include malaise/fatigue and peripheral edema. Risk factors for coronary artery disease include obesity. The current treatment provides mild improvement.      Review of Systems  Constitutional:  Positive for malaise/fatigue.  All other systems reviewed and are negative.      Objective:   Physical Exam Vitals reviewed.  Constitutional:      General: She is not in acute distress.    Appearance: She is well-developed.  HENT:     Head: Normocephalic and atraumatic.  Eyes:     Pupils: Pupils are equal, round, and reactive to light.  Neck:     Thyroid: No thyromegaly.  Cardiovascular:     Rate and Rhythm: Normal rate and regular rhythm.     Heart sounds: Normal heart sounds. No murmur heard. Pulmonary:     Effort: Pulmonary effort is normal. No respiratory distress.     Breath sounds: Normal breath sounds. No wheezing.  Abdominal:     General: Bowel sounds are normal. There is no distension.     Palpations: Abdomen is soft.     Tenderness: There is no abdominal tenderness.  Musculoskeletal:        General: No tenderness. Normal range of motion.     Cervical back: Normal range of motion and neck supple.  Skin:    General: Skin is warm and dry.  Neurological:     Mental Status: She is alert and oriented to person, place, and time.     Cranial Nerves: No cranial nerve deficit.     Deep Tendon Reflexes: Reflexes are normal and symmetric.     Comments: tremor  Psychiatric:         Behavior: Behavior normal.        Thought Content: Thought content normal.        Judgment: Judgment normal.       BP (!) 151/83   Pulse (!) 105   Temp (!) 97 F (36.1 C) (Temporal)   Ht 5' 4"$  (1.626 m)   Wt 155 lb 9.6 oz (70.6 kg)   SpO2 96%   BMI 26.71 kg/m      Assessment & Plan:   Virginia [name] comes in today with chief complaint of Edema (Bilateral ankle and legs for the last couple of weeks )   Diagnosis and orders addressed:  1. Peripheral edema - furosemide (LASIX) 20 MG tablet; Take 1 tablet (20 mg total) by mouth daily.  Dispense: 30 tablet; Refill: 3 - Brain natriuretic peptide - BMP8+EGFR - Compression stockings  2. Primary hypertension - furosemide (LASIX) 20 MG tablet; Take 1 tablet (20 mg total) by mouth daily.  Dispense: 30 tablet; Refill: 3 - Brain natriuretic peptide - BMP8+EGFR - Compression stockings   Stop HCTZ 12.5 mg and start Lasix 20 mg  Compression hose  Keep elevated Labs pending  Follow up in 2 weeks    Virginia Dun, FNP

## 2022-08-11 LAB — BMP8+EGFR
BUN/Creatinine Ratio: 14 (ref 12–28)
BUN: 13 mg/dL (ref 8–27)
CO2: 26 mmol/L (ref 20–29)
Calcium: 9.8 mg/dL (ref 8.7–10.3)
Chloride: 98 mmol/L (ref 96–106)
Creatinine, Ser: 0.93 mg/dL (ref 0.57–1.00)
Glucose: 115 mg/dL — ABNORMAL HIGH (ref 70–99)
Potassium: 3.7 mmol/L (ref 3.5–5.2)
Sodium: 139 mmol/L (ref 134–144)
eGFR: 62 mL/min/{1.73_m2} (ref >59)

## 2022-08-11 LAB — BRAIN NATRIURETIC PEPTIDE: BNP: 602 pg/mL — ABNORMAL HIGH (ref 0.0–100.0)

## 2022-08-13 ENCOUNTER — Other Ambulatory Visit: Payer: Self-pay | Admitting: Family

## 2022-08-24 ENCOUNTER — Ambulatory Visit (INDEPENDENT_AMBULATORY_CARE_PROVIDER_SITE_OTHER): Payer: Medicare Other | Admitting: Family

## 2022-08-24 ENCOUNTER — Encounter: Payer: Self-pay | Admitting: Family

## 2022-08-24 VITALS — BP 110/68 | HR 77 | Temp 97.6°F | Ht 64.0 in | Wt 144.0 lb

## 2022-08-24 DIAGNOSIS — I110 Hypertensive heart disease with heart failure: Secondary | ICD-10-CM

## 2022-08-24 DIAGNOSIS — I509 Heart failure, unspecified: Secondary | ICD-10-CM | POA: Diagnosis not present

## 2022-08-24 DIAGNOSIS — R609 Edema, unspecified: Secondary | ICD-10-CM

## 2022-08-24 DIAGNOSIS — E1169 Type 2 diabetes mellitus with other specified complication: Secondary | ICD-10-CM

## 2022-08-24 DIAGNOSIS — I10 Essential (primary) hypertension: Secondary | ICD-10-CM | POA: Diagnosis not present

## 2022-08-24 MED ORDER — DAPAGLIFLOZIN PROPANEDIOL 10 MG PO TABS: 10.0000 mg | ORAL_TABLET | Freq: Every day | ORAL | 1 refills | Status: DC

## 2022-08-24 NOTE — Patient Instructions (Signed)
Heart Failure, Diagnosis  Heart failure is a condition in which the heart has trouble pumping blood. This may mean that the heart cannot pump enough blood out to the body or that the heart does not fill up with enough blood. For some people with heart failure, fluid may back up into the lungs. There may also be swelling (edema) in the lower legs. Heart failure is usually a long-term (chronic) condition. It is important for you to take good care of yourself and follow the treatment plan from your health care provider. Different stages of heart failure have different treatment plans. The stages are: Stage A: At risk for heart failure. Having no symptoms of heart failure, but being at risk for developing heart failure. Stage B: Pre-heart failure. Having no symptoms of heart failure, but having structural changes to the heart that indicate heart failure. Stage C: Symptomatic heart failure. Having symptoms of heart failure in addition to structural changes to the heart that indicate heart failure. Stage D: Advanced heart failure. Having symptoms that interfere with daily life and frequent hospitalizations related to heart failure. What are the causes? This condition may be caused by: High blood pressure (hypertension). Hypertension causes the heart muscle to work harder than normal. Coronary artery disease, or CAD. CAD is the buildup of cholesterol and fat (plaque) in the arteries of the heart. Heart attack, also called myocardial infarction. This injures the heart muscle, making it hard for the heart to pump blood. Abnormal heart valves. The valves do not open and close properly, forcing the heart to pump harder to keep the blood flowing. Heart muscle disease, inflammation, or infection (cardiomyopathy or myocarditis). This is damage to the heart muscle. It can increase the risk of heart failure. Lung disease. The heart works harder when the lungs are not healthy. What increases the risk? The risk  of heart failure increases as a person ages. This condition is also more likely to develop in people who: Are obese. Use tobacco or nicotine products. Abuse alcohol or drugs. Have taken medicines that can damage the heart, such as chemotherapy drugs. Have any of these conditions: Diabetes. Abnormal heart rhythms. Thyroid problems. Low blood counts (anemia). Chronic kidney disease. Have a family history of heart failure. What are the signs or symptoms? Symptoms of this condition include: Shortness of breath with activity, such as when climbing stairs. A cough that does not go away. Swelling of the feet, ankles, legs, or abdomen. Losing or gaining weight for no reason. Trouble breathing when lying flat. Waking from sleep because of the need to sit up and get more air. Rapid heartbeat. Other symptoms may include: Tiredness (fatigue) and loss of energy. Feeling light-headed, dizzy, or close to fainting. Nausea or loss of appetite. Waking up more often during the night to urinate (nocturia). Confusion. How is this diagnosed? This condition is diagnosed based on: Your medical history, symptoms, and a physical exam. Blood tests. Diagnostic tests, which may include: Echocardiogram. Electrocardiogram (ECG). Chest X-ray. Exercise stress test. Cardiac MRI. Cardiac catheterization and angiogram. Radionuclide scans. How is this treated? Treatment for this condition is aimed at managing the symptoms of heart failure. Medicines Treatment may include medicines that: Help lower blood pressure by relaxing (dilating) the blood vessels. These medicines are called ACE inhibitors (angiotensin-converting enzyme), ARBs (angiotensin receptor blockers), or vasodilators. Cause the kidneys to remove salt and water from the blood through urination (diuretics). Improve heart muscle strength and prevent the heart from beating too fast (beta blockers). Increase the   force of the heartbeat  (digoxin). Lower heart rates. Certain diabetes medicines (SGLT-2 inhibitors) may also be used in treatment. Healthy behavior changes Treatment may also include making healthy lifestyle changes, such as: Reaching and staying at a healthy weight. Not using tobacco or nicotine products. Eating heart-healthy foods. Limiting or avoiding alcohol. Stopping the use of illegal drugs. Being physically active. Participating in a cardiac rehabilitation program, which is a treatment program to improve your health and well-being through exercise training, education, and counseling. Other treatments Other treatments may include: Procedures to open blocked arteries or repair damaged valves. Placing a pacemaker to improve heart function (cardiac resynchronization therapy). Placing a device to treat serious abnormal heart rhythms (implantable cardioverter defibrillator, or ICD). Placing a device to improve the pumping ability of the heart (left ventricular assist device, or LVAD). Receiving a healthy heart from a donor (heart transplant). This is done when other treatments have not helped. Follow these instructions at home: Manage other health conditions as told by your health care provider. These may include hypertension, diabetes, thyroid disease, or abnormal heart rhythms. Get ongoing education and support as needed. Learn as much as you can about heart failure. Keep all follow-up visits. This is important. Where to find more information American Heart Association: www.heart.org Centers for Disease Control and Prevention: www.cdc.gov NIH National Institute on Aging: www.nia.nih.gov Summary Heart failure is a condition in which the heart has trouble pumping blood. This condition is commonly caused by high blood pressure and other diseases of the heart and lungs. Symptoms of this condition include shortness of breath, tiredness (fatigue), nausea, and swelling of the feet, ankles, legs, or  abdomen. Treatments for this condition may include medicines, lifestyle changes, and surgery. Manage other health conditions as told by your health care provider. This information is not intended to replace advice given to you by your health care provider. Make sure you discuss any questions you have with your health care provider. Document Revised: 09/12/2021 Document Reviewed: 12/26/2019 Elsevier Patient Education  2023 Elsevier Inc.  

## 2022-08-24 NOTE — Progress Notes (Signed)
Subjective:    Patient ID: Virginia [name], female    DOB: [birth year], 82 y.o.   MRN: [id number]  Chief Complaint  Patient presents with   Follow-up   Edema   Leg Pain   Pt presents to the office today to follow up on bilateral leg swelling and erythemas. She was seen on 08/10/22  and we ordered compression hose daily.   We stopped her HCTZ 12.5 mg and started lasix 20 mg.  Her BNP was elevated to 602. She has lost 11 lb since our last visit.      08/24/2022    2:02 PM 08/10/2022    2:34 PM 07/17/2022   10:17 AM  Last 3 Weights  Weight (lbs) 144 lb 155 lb 9.6 oz 150 lb 12.8 oz  Weight (kg) 65.318 kg 70.58 kg 68.402 kg     Leg Pain   Congestive Heart Failure Presents for follow-up visit. Associated symptoms include edema, fatigue and shortness of breath. The symptoms have been stable.  Hypertension This is a chronic problem. The current episode started more than 1 year ago. The problem has been resolved since onset. The problem is controlled. Associated symptoms include peripheral edema and shortness of breath. Pertinent negatives include no blurred vision or malaise/fatigue. Risk factors for coronary artery disease include dyslipidemia and sedentary lifestyle.  Diabetes She presents for her follow-up diabetic visit. She has type 2 diabetes mellitus. Associated symptoms include fatigue. Pertinent negatives for diabetes include no blurred vision and no foot paresthesias. Symptoms are stable. Diabetic complications include heart disease. Risk factors for coronary artery disease include dyslipidemia, diabetes mellitus, hypertension and sedentary lifestyle. Her overall blood glucose range is 110-130 mg/dl.      Review of Systems  Constitutional:  Positive for fatigue. Negative for malaise/fatigue.  Eyes:  Negative for blurred vision.  Respiratory:  Positive for shortness of breath.   All other systems reviewed and are negative.      Objective:   Physical Exam Vitals reviewed.   Constitutional:      General: She is not in acute distress.    Appearance: She is well-developed.  HENT:     Head: Normocephalic and atraumatic.     Right Ear: Tympanic membrane normal.     Left Ear: Tympanic membrane normal.  Eyes:     Pupils: Pupils are equal, round, and reactive to light.  Neck:     Thyroid: No thyromegaly.  Cardiovascular:     Rate and Rhythm: Normal rate and regular rhythm.     Heart sounds: Normal heart sounds. No murmur heard. Pulmonary:     Effort: Pulmonary effort is normal. No respiratory distress.     Breath sounds: Normal breath sounds. No wheezing.  Abdominal:     General: Bowel sounds are normal. There is no distension.     Palpations: Abdomen is soft.     Tenderness: There is no abdominal tenderness.  Musculoskeletal:        General: No tenderness. Normal range of motion.     Cervical back: Normal range of motion and neck supple.     Right lower leg: Edema (2+) present.     Left lower leg: Edema (2+) present.  Skin:    General: Skin is warm and dry.  Neurological:     Mental Status: She is alert and oriented to person, place, and time.     Cranial Nerves: No cranial nerve deficit.     Deep Tendon Reflexes: Reflexes are normal and symmetric.  Psychiatric:        Behavior: Behavior normal.        Thought Content: Thought content normal.        Judgment: Judgment normal.       BP 110/68   Pulse 77   Temp 97.6 F (36.4 C) (Temporal)   Ht '5\' 4"'$  (1.626 m)   Wt 144 lb (65.3 kg)   SpO2 98%   BMI 24.72 kg/m      Assessment & Plan:  Virginia [name] comes in today with chief complaint of Follow-up, Edema, and Leg Pain   Diagnosis and orders addressed:  1. Acute on chronic congestive heart failure, unspecified heart failure type (HCC) Will add Farxiga 10 mg  Continue lasix 20 mg  Continue compression hose Weight daily, take extra lasix for >3 lbs in one day or >5 lbs in a week Strict low salt diet - dapagliflozin propanediol  (FARXIGA) 10 MG TABS tablet; Take 1 tablet (10 mg total) by mouth daily before breakfast.  Dispense: 90 tablet; Refill: 1 - CMP14+EGFR  2. Peripheral edema - CMP14+EGFR  3. Primary hypertension - CMP14+EGFR  4. Type 2 diabetes mellitus with other specified complication, without long-term current use of insulin ([place]) - CMP14+EGFR   Labs pending Health Maintenance reviewed Diet and exercise encouraged  Follow up plan: 1 month   Evelina Dun, FNP

## 2022-08-25 LAB — CMP14+EGFR
ALT: 21 IU/L (ref 0–32)
AST: 34 IU/L (ref 0–40)
Albumin/Globulin Ratio: 2.4 — ABNORMAL HIGH (ref 1.2–2.2)
Albumin: 4.4 g/dL (ref 3.7–4.7)
Alkaline Phosphatase: 146 IU/L — ABNORMAL HIGH (ref 44–121)
BUN/Creatinine Ratio: 13 (ref 12–28)
BUN: 12 mg/dL (ref 8–27)
Bilirubin Total: 2.4 mg/dL — ABNORMAL HIGH (ref 0.0–1.2)
CO2: 29 mmol/L (ref 20–29)
Calcium: 9.6 mg/dL (ref 8.7–10.3)
Chloride: 99 mmol/L (ref 96–106)
Creatinine, Ser: 0.91 mg/dL (ref 0.57–1.00)
Globulin, Total: 1.8 g/dL (ref 1.5–4.5)
Glucose: 150 mg/dL — ABNORMAL HIGH (ref 70–99)
Potassium: 3.3 mmol/L — ABNORMAL LOW (ref 3.5–5.2)
Sodium: 144 mmol/L (ref 134–144)
Total Protein: 6.2 g/dL (ref 6.0–8.5)
eGFR: 63 mL/min/{1.73_m2} (ref >59)

## 2022-08-27 ENCOUNTER — Other Ambulatory Visit: Payer: Self-pay | Admitting: Family

## 2022-08-27 MED ORDER — POTASSIUM CHLORIDE CRYS ER 20 MEQ PO TBCR: 20.0000 meq | EXTENDED_RELEASE_TABLET | Freq: Every day | ORAL | 1 refills | Status: DC

## 2022-10-05 ENCOUNTER — Other Ambulatory Visit: Payer: Self-pay | Admitting: Family

## 2022-10-05 DIAGNOSIS — R600 Localized edema: Secondary | ICD-10-CM

## 2022-10-08 ENCOUNTER — Ambulatory Visit (INDEPENDENT_AMBULATORY_CARE_PROVIDER_SITE_OTHER): Payer: Medicare Other | Admitting: Family

## 2022-10-08 ENCOUNTER — Encounter: Payer: Self-pay | Admitting: Family

## 2022-10-08 VITALS — BP 94/49 | HR 107 | Temp 98.7°F | Ht 64.0 in

## 2022-10-08 DIAGNOSIS — M150 Primary generalized (osteo)arthritis: Secondary | ICD-10-CM

## 2022-10-08 DIAGNOSIS — I2510 Atherosclerotic heart disease of native coronary artery without angina pectoris: Secondary | ICD-10-CM

## 2022-10-08 DIAGNOSIS — I482 Chronic atrial fibrillation, unspecified: Secondary | ICD-10-CM

## 2022-10-08 DIAGNOSIS — M159 Polyosteoarthritis, unspecified: Secondary | ICD-10-CM | POA: Diagnosis not present

## 2022-10-08 DIAGNOSIS — I10 Essential (primary) hypertension: Secondary | ICD-10-CM

## 2022-10-08 DIAGNOSIS — E782 Mixed hyperlipidemia: Secondary | ICD-10-CM

## 2022-10-08 DIAGNOSIS — K219 Gastro-esophageal reflux disease without esophagitis: Secondary | ICD-10-CM | POA: Diagnosis not present

## 2022-10-08 DIAGNOSIS — I959 Hypotension, unspecified: Secondary | ICD-10-CM | POA: Diagnosis not present

## 2022-10-08 DIAGNOSIS — H6123 Impacted cerumen, bilateral: Secondary | ICD-10-CM

## 2022-10-08 DIAGNOSIS — E1169 Type 2 diabetes mellitus with other specified complication: Secondary | ICD-10-CM

## 2022-10-08 DIAGNOSIS — G250 Essential tremor: Secondary | ICD-10-CM | POA: Diagnosis not present

## 2022-10-08 DIAGNOSIS — Z8719 Personal history of other diseases of the digestive system: Secondary | ICD-10-CM | POA: Diagnosis not present

## 2022-10-08 DIAGNOSIS — F331 Major depressive disorder, recurrent, moderate: Secondary | ICD-10-CM

## 2022-10-08 DIAGNOSIS — I2583 Coronary atherosclerosis due to lipid rich plaque: Secondary | ICD-10-CM

## 2022-10-08 LAB — CBC WITH DIFFERENTIAL/PLATELET
Immature Grans (Abs): 0 10*3/uL (ref 0.0–0.1)
MCH: 32 pg (ref 26.6–33.0)
MCV: 92 fL (ref 79–97)
Monocytes Absolute: 0.8 10*3/uL (ref 0.1–0.9)
Platelets: 179 10*3/uL (ref 150–450)
RBC: 4.84 x10E6/uL (ref 3.77–5.28)

## 2022-10-08 LAB — CMP14+EGFR
BUN: 18 mg/dL (ref 8–27)
Glucose: 159 mg/dL — ABNORMAL HIGH (ref 70–99)

## 2022-10-08 LAB — BAYER DCA HB A1C WAIVED: HB A1C (BAYER DCA - WAIVED): 6.8 % — ABNORMAL HIGH (ref 4.8–5.6)

## 2022-10-08 NOTE — Patient Instructions (Signed)

## 2022-10-08 NOTE — Progress Notes (Signed)
Subjective:    Patient ID: Virginia [name], female    DOB: 09-07-1940, 82 y.o.   MRN: [id number]  Chief Complaint  Patient presents with   Medical Management of Chronic Issues    Too sore today she is in wheelchair. Her fluid is a lot better today in legs and feet    Ear Fullness    Right ear    Back Pain   Neck Pain   PT presents to the office today for chronic follow up.   She had seen GI for GI Bleed and was cleared. Continue to avoid NSAID's.    She is followed by Cardiologists annually for A Fib, CAD, and HTN. Continues to take Eliquis. These are stable.    She was started on lasix for edema and CHF. Her fluid has greatly improved. She has lost 11 lb.      08/24/2022    2:02 PM 08/10/2022    2:34 PM 07/17/2022   10:17 AM  Last 3 Weights  Weight (lbs) 144 lb 155 lb 9.6 oz 150 lb 12.8 oz  Weight (kg) 65.318 kg 70.58 kg 68.402 kg     Ear Fullness  There is pain in both ears. The current episode started more than 1 month ago. The problem has been unchanged. There has been no fever. The pain is moderate. Associated symptoms include neck pain. Pertinent negatives include no rhinorrhea or sore throat. She has tried nothing for the symptoms. The treatment provided no relief.  Back Pain This is a chronic problem. The current episode started more than 1 year ago. The problem occurs intermittently. The problem has been waxing and waning since onset. The pain is present in the lumbar spine. The quality of the pain is described as aching. The pain is at a severity of 5/10. The pain is moderate. She has tried analgesics for the symptoms. The treatment provided moderate relief.  Neck Pain  This is a chronic problem. The current episode started more than 1 year ago. The problem occurs intermittently. The problem has been waxing and waning. The pain is at a severity of 7/10. The pain is moderate. The symptoms are aggravated by swallowing and twisting. She has tried bed rest and ice for the  symptoms. The treatment provided mild relief.  Hypertension This is a chronic problem. The current episode started more than 1 year ago. The problem has been resolved since onset. The problem is controlled. Associated symptoms include malaise/fatigue and neck pain. Pertinent negatives include no peripheral edema or shortness of breath. Risk factors for coronary artery disease include dyslipidemia and sedentary lifestyle. The current treatment provides moderate improvement.  Gastroesophageal Reflux She complains of belching and heartburn. She reports no sore throat. This is a chronic problem. The current episode started more than 1 year ago. The problem occurs rarely. She has tried a PPI for the symptoms. The treatment provided moderate relief.  Hyperlipidemia This is a chronic problem. The current episode started more than 1 year ago. The problem is controlled. Pertinent negatives include no shortness of breath. Current antihyperlipidemic treatment includes statins. The current treatment provides moderate improvement of lipids. Risk factors for coronary artery disease include dyslipidemia, hypertension, a sedentary lifestyle and post-menopausal.      Review of Systems  Constitutional:  Positive for malaise/fatigue.  HENT:  Negative for rhinorrhea and sore throat.   Respiratory:  Negative for shortness of breath.   Gastrointestinal:  Positive for heartburn.  Musculoskeletal:  Positive for back pain and  neck pain.  All other systems reviewed and are negative.      Objective:   Physical Exam Vitals reviewed.  Constitutional:      General: She is not in acute distress.    Appearance: She is well-developed.  HENT:     Head: Normocephalic and atraumatic.     Right Ear: There is impacted cerumen.     Left Ear: There is impacted cerumen.  Eyes:     Pupils: Pupils are equal, round, and reactive to light.  Neck:     Thyroid: No thyromegaly.  Cardiovascular:     Rate and Rhythm: Normal rate  and regular rhythm.     Heart sounds: Normal heart sounds. No murmur heard. Pulmonary:     Effort: Pulmonary effort is normal. No respiratory distress.     Breath sounds: Normal breath sounds. No wheezing.  Abdominal:     General: Bowel sounds are normal. There is no distension.     Palpations: Abdomen is soft.     Tenderness: There is no abdominal tenderness.  Musculoskeletal:        General: No tenderness. Normal range of motion.     Cervical back: Normal range of motion and neck supple.  Skin:    General: Skin is warm and dry.  Neurological:     Mental Status: She is alert and oriented to person, place, and time.     Cranial Nerves: No cranial nerve deficit.     Motor: Weakness present.     Gait: Gait abnormal (wheelchair).     Deep Tendon Reflexes: Reflexes are normal and symmetric.     Comments: Tremors   Psychiatric:        Behavior: Behavior normal.        Thought Content: Thought content normal.        Judgment: Judgment normal.     Bilateral ears washed with warm water and peroxide. Able to get right ear cleared and TM WNL. Left ear used ear curette and removed large hard wax. Mild bleeding of bilateral canal. Pt tolerate well.   BP (!) 94/49   Pulse (!) 107   Temp 98.7 F (37.1 C) (Temporal)   Ht  (1.626 m)   SpO2 94%   BMI 24.72 kg/m      Assessment & Plan:  Virginia [name] comes in today with chief complaint of Medical Management of Chronic Issues (Too sore today she is in wheelchair. Her fluid is a lot better today in legs and feet/), Ear Fullness (Right ear ), Back Pain, and Neck Pain   Diagnosis and orders addressed:  1. Chronic atrial fibrillation - CMP14+EGFR - CBC with Differential/Platelet  2. Essential tremor - CMP14+EGFR - CBC with Differential/Platelet  3. Gastroesophageal reflux disease, unspecified whether esophagitis present - CMP14+EGFR - CBC with Differential/Platelet  4. Type 2 diabetes mellitus with other specified  complication, without long-term current use of insulin - CMP14+EGFR - CBC with Differential/Platelet - Bayer DCA Hb A1c Waived  5. Primary hypertension - CMP14+EGFR - CBC with Differential/Platelet  6. Primary osteoarthritis involving multiple joints - CMP14+EGFR - CBC with Differential/Platelet  7. Moderate episode of recurrent major depressive disorder - CMP14+EGFR - CBC with Differential/Platelet  8. Mixed hyperlipidemia - CMP14+EGFR - CBC with Differential/Platelet  9. H/O: GI bleed - CMP14+EGFR - CBC with Differential/Platelet  10. Coronary artery disease due to lipid rich plaque - CMP14+EGFR - CBC with Differential/Platelet  11. Hypotension, unspecified hypotension type Will stop lisinopril 20 mg  -  CMP14+EGFR - CBC with Differential/Platelet  12. Bilateral impacted cerumen - Improved  - CMP14+EGFR - CBC with Differential/Platelet   Labs pending Health Maintenance reviewed Diet and exercise encouraged  Follow up plan: 3 months    Jannifer Rodney, FNP

## 2022-10-08 NOTE — Addendum Note (Signed)
Addended by: Jannifer Rodney A on: 10/08/2022 10:46 AM   Modules accepted: Level of Service

## 2022-10-09 LAB — CBC WITH DIFFERENTIAL/PLATELET
Basophils Absolute: 0 10*3/uL (ref 0.0–0.2)
Basos: 0 %
EOS (ABSOLUTE): 0.1 10*3/uL (ref 0.0–0.4)
Eos: 1 %
Hematocrit: 44.5 % (ref 34.0–46.6)
Hemoglobin: 15.5 g/dL (ref 11.1–15.9)
Immature Granulocytes: 0 %
Lymphocytes Absolute: 1.3 10*3/uL (ref 0.7–3.1)
Lymphs: 16 %
MCHC: 34.8 g/dL (ref 31.5–35.7)
Monocytes: 9 %
Neutrophils Absolute: 6 10*3/uL (ref 1.4–7.0)
Neutrophils: 74 %
RDW: 11.8 % (ref 11.7–15.4)
WBC: 8.2 10*3/uL (ref 3.4–10.8)

## 2022-10-09 LAB — CMP14+EGFR
ALT: 20 IU/L (ref 0–32)
AST: 26 IU/L (ref 0–40)
Albumin/Globulin Ratio: 2.2 (ref 1.2–2.2)
Albumin: 4.3 g/dL (ref 3.7–4.7)
Alkaline Phosphatase: 134 IU/L — ABNORMAL HIGH (ref 44–121)
BUN/Creatinine Ratio: 22 (ref 12–28)
Bilirubin Total: 1.4 mg/dL — ABNORMAL HIGH (ref 0.0–1.2)
CO2: 25 mmol/L (ref 20–29)
Calcium: 10.4 mg/dL — ABNORMAL HIGH (ref 8.7–10.3)
Chloride: 96 mmol/L (ref 96–106)
Creatinine, Ser: 0.82 mg/dL (ref 0.57–1.00)
Globulin, Total: 2 g/dL (ref 1.5–4.5)
Potassium: 3.7 mmol/L (ref 3.5–5.2)
Sodium: 137 mmol/L (ref 134–144)
Total Protein: 6.3 g/dL (ref 6.0–8.5)
eGFR: 72 mL/min/{1.73_m2} (ref >59)

## 2022-10-15 ENCOUNTER — Other Ambulatory Visit: Payer: Self-pay | Admitting: Family

## 2022-10-15 DIAGNOSIS — I480 Paroxysmal atrial fibrillation: Secondary | ICD-10-CM

## 2022-10-24 ENCOUNTER — Other Ambulatory Visit: Payer: Self-pay | Admitting: Family

## 2022-10-24 DIAGNOSIS — R600 Localized edema: Secondary | ICD-10-CM

## 2022-10-29 ENCOUNTER — Other Ambulatory Visit: Payer: Self-pay | Admitting: Family

## 2022-10-29 DIAGNOSIS — I10 Essential (primary) hypertension: Secondary | ICD-10-CM

## 2022-11-09 ENCOUNTER — Other Ambulatory Visit: Payer: Self-pay | Admitting: Family

## 2022-11-09 DIAGNOSIS — I10 Essential (primary) hypertension: Secondary | ICD-10-CM

## 2022-11-11 DIAGNOSIS — R6889 Other general symptoms and signs: Secondary | ICD-10-CM | POA: Diagnosis not present

## 2022-11-11 DIAGNOSIS — K838 Other specified diseases of biliary tract: Secondary | ICD-10-CM | POA: Diagnosis not present

## 2022-11-11 DIAGNOSIS — I700 Atherosclerosis of aorta: Secondary | ICD-10-CM | POA: Diagnosis not present

## 2022-11-11 DIAGNOSIS — I6340 Cerebral infarction due to embolism of unspecified cerebral artery: Secondary | ICD-10-CM | POA: Insufficient documentation

## 2022-11-11 DIAGNOSIS — I6782 Cerebral ischemia: Secondary | ICD-10-CM | POA: Diagnosis not present

## 2022-11-11 DIAGNOSIS — I10 Essential (primary) hypertension: Secondary | ICD-10-CM | POA: Diagnosis not present

## 2022-11-11 DIAGNOSIS — Z1152 Encounter for screening for COVID-19: Secondary | ICD-10-CM | POA: Diagnosis not present

## 2022-11-11 DIAGNOSIS — R600 Localized edema: Secondary | ICD-10-CM | POA: Diagnosis not present

## 2022-11-11 DIAGNOSIS — R918 Other nonspecific abnormal finding of lung field: Secondary | ICD-10-CM | POA: Diagnosis not present

## 2022-11-11 DIAGNOSIS — R410 Disorientation, unspecified: Secondary | ICD-10-CM | POA: Diagnosis not present

## 2022-11-11 DIAGNOSIS — Z7901 Long term (current) use of anticoagulants: Secondary | ICD-10-CM | POA: Diagnosis not present

## 2022-11-11 DIAGNOSIS — I4891 Unspecified atrial fibrillation: Secondary | ICD-10-CM | POA: Diagnosis not present

## 2022-11-11 DIAGNOSIS — Z9911 Dependence on respirator [ventilator] status: Secondary | ICD-10-CM | POA: Diagnosis not present

## 2022-11-11 DIAGNOSIS — I480 Paroxysmal atrial fibrillation: Secondary | ICD-10-CM | POA: Diagnosis not present

## 2022-11-11 DIAGNOSIS — Z95828 Presence of other vascular implants and grafts: Secondary | ICD-10-CM | POA: Diagnosis not present

## 2022-11-11 DIAGNOSIS — E785 Hyperlipidemia, unspecified: Secondary | ICD-10-CM | POA: Diagnosis not present

## 2022-11-11 DIAGNOSIS — I6529 Occlusion and stenosis of unspecified carotid artery: Secondary | ICD-10-CM | POA: Diagnosis not present

## 2022-11-11 DIAGNOSIS — J9610 Chronic respiratory failure, unspecified whether with hypoxia or hypercapnia: Secondary | ICD-10-CM | POA: Diagnosis not present

## 2022-11-11 DIAGNOSIS — A419 Sepsis, unspecified organism: Secondary | ICD-10-CM | POA: Diagnosis not present

## 2022-11-11 DIAGNOSIS — Z743 Need for continuous supervision: Secondary | ICD-10-CM | POA: Diagnosis not present

## 2022-11-11 DIAGNOSIS — I6501 Occlusion and stenosis of right vertebral artery: Secondary | ICD-10-CM | POA: Diagnosis not present

## 2022-11-11 DIAGNOSIS — A86 Unspecified viral encephalitis: Secondary | ICD-10-CM | POA: Diagnosis not present

## 2022-11-11 DIAGNOSIS — I499 Cardiac arrhythmia, unspecified: Secondary | ICD-10-CM | POA: Diagnosis not present

## 2022-11-11 DIAGNOSIS — G9340 Encephalopathy, unspecified: Secondary | ICD-10-CM | POA: Diagnosis not present

## 2022-11-12 DIAGNOSIS — J9601 Acute respiratory failure with hypoxia: Secondary | ICD-10-CM | POA: Diagnosis not present

## 2022-11-12 DIAGNOSIS — N281 Cyst of kidney, acquired: Secondary | ICD-10-CM | POA: Diagnosis not present

## 2022-11-12 DIAGNOSIS — Z743 Need for continuous supervision: Secondary | ICD-10-CM | POA: Diagnosis not present

## 2022-11-12 DIAGNOSIS — R410 Disorientation, unspecified: Secondary | ICD-10-CM | POA: Diagnosis not present

## 2022-11-12 DIAGNOSIS — I110 Hypertensive heart disease with heart failure: Secondary | ICD-10-CM | POA: Diagnosis not present

## 2022-11-12 DIAGNOSIS — K5730 Diverticulosis of large intestine without perforation or abscess without bleeding: Secondary | ICD-10-CM | POA: Diagnosis not present

## 2022-11-12 DIAGNOSIS — J9691 Respiratory failure, unspecified with hypoxia: Secondary | ICD-10-CM | POA: Diagnosis not present

## 2022-11-12 DIAGNOSIS — I6529 Occlusion and stenosis of unspecified carotid artery: Secondary | ICD-10-CM | POA: Diagnosis not present

## 2022-11-12 DIAGNOSIS — K838 Other specified diseases of biliary tract: Secondary | ICD-10-CM | POA: Diagnosis not present

## 2022-11-12 DIAGNOSIS — I5030 Unspecified diastolic (congestive) heart failure: Secondary | ICD-10-CM | POA: Diagnosis not present

## 2022-11-12 DIAGNOSIS — Z7409 Other reduced mobility: Secondary | ICD-10-CM | POA: Diagnosis not present

## 2022-11-12 DIAGNOSIS — I482 Chronic atrial fibrillation, unspecified: Secondary | ICD-10-CM | POA: Diagnosis not present

## 2022-11-12 DIAGNOSIS — Z789 Other specified health status: Secondary | ICD-10-CM | POA: Diagnosis not present

## 2022-11-12 DIAGNOSIS — R29810 Facial weakness: Secondary | ICD-10-CM | POA: Diagnosis not present

## 2022-11-12 DIAGNOSIS — Z4682 Encounter for fitting and adjustment of non-vascular catheter: Secondary | ICD-10-CM | POA: Diagnosis not present

## 2022-11-12 DIAGNOSIS — G20C Parkinsonism, unspecified: Secondary | ICD-10-CM | POA: Diagnosis not present

## 2022-11-12 DIAGNOSIS — M6281 Muscle weakness (generalized): Secondary | ICD-10-CM | POA: Diagnosis not present

## 2022-11-12 DIAGNOSIS — I340 Nonrheumatic mitral (valve) insufficiency: Secondary | ICD-10-CM | POA: Diagnosis not present

## 2022-11-12 DIAGNOSIS — I10 Essential (primary) hypertension: Secondary | ICD-10-CM | POA: Diagnosis not present

## 2022-11-12 DIAGNOSIS — I4891 Unspecified atrial fibrillation: Secondary | ICD-10-CM | POA: Diagnosis not present

## 2022-11-12 DIAGNOSIS — Z7901 Long term (current) use of anticoagulants: Secondary | ICD-10-CM | POA: Diagnosis not present

## 2022-11-12 DIAGNOSIS — R2681 Unsteadiness on feet: Secondary | ICD-10-CM | POA: Diagnosis not present

## 2022-11-12 DIAGNOSIS — R531 Weakness: Secondary | ICD-10-CM | POA: Diagnosis not present

## 2022-11-12 DIAGNOSIS — R1310 Dysphagia, unspecified: Secondary | ICD-10-CM | POA: Diagnosis not present

## 2022-11-12 DIAGNOSIS — I639 Cerebral infarction, unspecified: Secondary | ICD-10-CM | POA: Diagnosis not present

## 2022-11-12 DIAGNOSIS — I6340 Cerebral infarction due to embolism of unspecified cerebral artery: Secondary | ICD-10-CM | POA: Diagnosis not present

## 2022-11-12 DIAGNOSIS — E785 Hyperlipidemia, unspecified: Secondary | ICD-10-CM | POA: Diagnosis not present

## 2022-11-12 DIAGNOSIS — K828 Other specified diseases of gallbladder: Secondary | ICD-10-CM | POA: Diagnosis not present

## 2022-11-12 DIAGNOSIS — I480 Paroxysmal atrial fibrillation: Secondary | ICD-10-CM | POA: Diagnosis not present

## 2022-11-12 DIAGNOSIS — Z95828 Presence of other vascular implants and grafts: Secondary | ICD-10-CM | POA: Diagnosis not present

## 2022-11-12 DIAGNOSIS — K219 Gastro-esophageal reflux disease without esophagitis: Secondary | ICD-10-CM | POA: Diagnosis not present

## 2022-11-12 DIAGNOSIS — R918 Other nonspecific abnormal finding of lung field: Secondary | ICD-10-CM | POA: Diagnosis not present

## 2022-11-12 DIAGNOSIS — I700 Atherosclerosis of aorta: Secondary | ICD-10-CM | POA: Diagnosis not present

## 2022-11-12 DIAGNOSIS — I509 Heart failure, unspecified: Secondary | ICD-10-CM | POA: Diagnosis not present

## 2022-11-12 DIAGNOSIS — A86 Unspecified viral encephalitis: Secondary | ICD-10-CM | POA: Diagnosis not present

## 2022-11-12 DIAGNOSIS — R600 Localized edema: Secondary | ICD-10-CM | POA: Diagnosis not present

## 2022-11-12 DIAGNOSIS — R42 Dizziness and giddiness: Secondary | ICD-10-CM | POA: Diagnosis not present

## 2022-11-12 DIAGNOSIS — A419 Sepsis, unspecified organism: Secondary | ICD-10-CM | POA: Diagnosis not present

## 2022-11-12 DIAGNOSIS — N390 Urinary tract infection, site not specified: Secondary | ICD-10-CM | POA: Diagnosis not present

## 2022-11-12 DIAGNOSIS — I081 Rheumatic disorders of both mitral and tricuspid valves: Secondary | ICD-10-CM | POA: Diagnosis not present

## 2022-11-12 DIAGNOSIS — I4811 Longstanding persistent atrial fibrillation: Secondary | ICD-10-CM | POA: Diagnosis not present

## 2022-11-12 DIAGNOSIS — G9340 Encephalopathy, unspecified: Secondary | ICD-10-CM | POA: Diagnosis not present

## 2022-11-12 DIAGNOSIS — R935 Abnormal findings on diagnostic imaging of other abdominal regions, including retroperitoneum: Secondary | ICD-10-CM | POA: Diagnosis not present

## 2022-11-12 DIAGNOSIS — Z9911 Dependence on respirator [ventilator] status: Secondary | ICD-10-CM | POA: Diagnosis not present

## 2022-11-12 DIAGNOSIS — Z1152 Encounter for screening for COVID-19: Secondary | ICD-10-CM | POA: Diagnosis not present

## 2022-11-12 DIAGNOSIS — H02401 Unspecified ptosis of right eyelid: Secondary | ICD-10-CM | POA: Diagnosis not present

## 2022-11-12 DIAGNOSIS — G459 Transient cerebral ischemic attack, unspecified: Secondary | ICD-10-CM | POA: Diagnosis not present

## 2022-11-12 DIAGNOSIS — Z8711 Personal history of peptic ulcer disease: Secondary | ICD-10-CM | POA: Diagnosis not present

## 2022-11-12 DIAGNOSIS — I5022 Chronic systolic (congestive) heart failure: Secondary | ICD-10-CM | POA: Diagnosis not present

## 2022-11-12 DIAGNOSIS — J9811 Atelectasis: Secondary | ICD-10-CM | POA: Diagnosis not present

## 2022-11-12 DIAGNOSIS — Z4659 Encounter for fitting and adjustment of other gastrointestinal appliance and device: Secondary | ICD-10-CM | POA: Diagnosis not present

## 2022-11-12 DIAGNOSIS — Z951 Presence of aortocoronary bypass graft: Secondary | ICD-10-CM | POA: Diagnosis not present

## 2022-11-13 DIAGNOSIS — I509 Heart failure, unspecified: Secondary | ICD-10-CM | POA: Diagnosis not present

## 2022-11-13 DIAGNOSIS — J9601 Acute respiratory failure with hypoxia: Secondary | ICD-10-CM | POA: Diagnosis not present

## 2022-11-13 DIAGNOSIS — J9811 Atelectasis: Secondary | ICD-10-CM | POA: Diagnosis not present

## 2022-11-13 DIAGNOSIS — I4891 Unspecified atrial fibrillation: Secondary | ICD-10-CM | POA: Diagnosis not present

## 2022-11-14 DIAGNOSIS — J9601 Acute respiratory failure with hypoxia: Secondary | ICD-10-CM | POA: Diagnosis not present

## 2022-11-14 DIAGNOSIS — Z789 Other specified health status: Secondary | ICD-10-CM | POA: Insufficient documentation

## 2022-11-14 DIAGNOSIS — Z4682 Encounter for fitting and adjustment of non-vascular catheter: Secondary | ICD-10-CM | POA: Diagnosis not present

## 2022-11-14 DIAGNOSIS — I6340 Cerebral infarction due to embolism of unspecified cerebral artery: Secondary | ICD-10-CM | POA: Diagnosis not present

## 2022-11-14 DIAGNOSIS — I639 Cerebral infarction, unspecified: Secondary | ICD-10-CM | POA: Insufficient documentation

## 2022-11-15 DIAGNOSIS — R1310 Dysphagia, unspecified: Secondary | ICD-10-CM | POA: Diagnosis not present

## 2022-11-15 DIAGNOSIS — Z4659 Encounter for fitting and adjustment of other gastrointestinal appliance and device: Secondary | ICD-10-CM | POA: Diagnosis not present

## 2022-11-15 DIAGNOSIS — R935 Abnormal findings on diagnostic imaging of other abdominal regions, including retroperitoneum: Secondary | ICD-10-CM | POA: Diagnosis not present

## 2022-11-15 DIAGNOSIS — N281 Cyst of kidney, acquired: Secondary | ICD-10-CM | POA: Diagnosis not present

## 2022-11-15 DIAGNOSIS — K838 Other specified diseases of biliary tract: Secondary | ICD-10-CM | POA: Diagnosis not present

## 2022-11-16 DIAGNOSIS — R1310 Dysphagia, unspecified: Secondary | ICD-10-CM | POA: Diagnosis not present

## 2022-11-17 DIAGNOSIS — R1310 Dysphagia, unspecified: Secondary | ICD-10-CM | POA: Diagnosis not present

## 2022-11-18 DIAGNOSIS — R1310 Dysphagia, unspecified: Secondary | ICD-10-CM | POA: Diagnosis not present

## 2022-11-19 DIAGNOSIS — R1310 Dysphagia, unspecified: Secondary | ICD-10-CM | POA: Diagnosis not present

## 2022-11-20 DIAGNOSIS — G459 Transient cerebral ischemic attack, unspecified: Secondary | ICD-10-CM | POA: Diagnosis not present

## 2022-11-20 DIAGNOSIS — R531 Weakness: Secondary | ICD-10-CM | POA: Diagnosis not present

## 2022-11-20 DIAGNOSIS — I6340 Cerebral infarction due to embolism of unspecified cerebral artery: Secondary | ICD-10-CM | POA: Diagnosis not present

## 2022-11-20 DIAGNOSIS — Z7409 Other reduced mobility: Secondary | ICD-10-CM | POA: Diagnosis not present

## 2022-11-20 DIAGNOSIS — Z743 Need for continuous supervision: Secondary | ICD-10-CM | POA: Diagnosis not present

## 2022-11-20 DIAGNOSIS — K219 Gastro-esophageal reflux disease without esophagitis: Secondary | ICD-10-CM | POA: Diagnosis not present

## 2022-11-20 DIAGNOSIS — D509 Iron deficiency anemia, unspecified: Secondary | ICD-10-CM | POA: Diagnosis not present

## 2022-11-20 DIAGNOSIS — I4891 Unspecified atrial fibrillation: Secondary | ICD-10-CM | POA: Diagnosis not present

## 2022-11-20 DIAGNOSIS — J9601 Acute respiratory failure with hypoxia: Secondary | ICD-10-CM | POA: Diagnosis not present

## 2022-11-20 DIAGNOSIS — I4811 Longstanding persistent atrial fibrillation: Secondary | ICD-10-CM | POA: Diagnosis not present

## 2022-11-20 DIAGNOSIS — I5020 Unspecified systolic (congestive) heart failure: Secondary | ICD-10-CM | POA: Diagnosis not present

## 2022-11-20 DIAGNOSIS — I639 Cerebral infarction, unspecified: Secondary | ICD-10-CM | POA: Diagnosis not present

## 2022-11-20 DIAGNOSIS — R2681 Unsteadiness on feet: Secondary | ICD-10-CM | POA: Diagnosis not present

## 2022-11-20 DIAGNOSIS — I10 Essential (primary) hypertension: Secondary | ICD-10-CM | POA: Diagnosis not present

## 2022-11-20 DIAGNOSIS — M6281 Muscle weakness (generalized): Secondary | ICD-10-CM | POA: Diagnosis not present

## 2022-11-20 DIAGNOSIS — R1310 Dysphagia, unspecified: Secondary | ICD-10-CM | POA: Diagnosis not present

## 2022-11-20 DIAGNOSIS — Z789 Other specified health status: Secondary | ICD-10-CM | POA: Diagnosis not present

## 2022-11-26 DIAGNOSIS — K219 Gastro-esophageal reflux disease without esophagitis: Secondary | ICD-10-CM | POA: Diagnosis not present

## 2022-11-26 DIAGNOSIS — R2681 Unsteadiness on feet: Secondary | ICD-10-CM | POA: Diagnosis not present

## 2022-11-26 DIAGNOSIS — I4891 Unspecified atrial fibrillation: Secondary | ICD-10-CM | POA: Diagnosis not present

## 2022-11-26 DIAGNOSIS — M6281 Muscle weakness (generalized): Secondary | ICD-10-CM | POA: Diagnosis not present

## 2022-11-26 DIAGNOSIS — I639 Cerebral infarction, unspecified: Secondary | ICD-10-CM | POA: Diagnosis not present

## 2022-11-26 DIAGNOSIS — I10 Essential (primary) hypertension: Secondary | ICD-10-CM | POA: Diagnosis not present

## 2022-11-26 DIAGNOSIS — D509 Iron deficiency anemia, unspecified: Secondary | ICD-10-CM | POA: Diagnosis not present

## 2022-11-28 DIAGNOSIS — D509 Iron deficiency anemia, unspecified: Secondary | ICD-10-CM | POA: Diagnosis not present

## 2022-11-28 DIAGNOSIS — I10 Essential (primary) hypertension: Secondary | ICD-10-CM | POA: Diagnosis not present

## 2022-11-28 DIAGNOSIS — K219 Gastro-esophageal reflux disease without esophagitis: Secondary | ICD-10-CM | POA: Diagnosis not present

## 2022-11-28 DIAGNOSIS — I639 Cerebral infarction, unspecified: Secondary | ICD-10-CM | POA: Diagnosis not present

## 2022-11-28 DIAGNOSIS — I4891 Unspecified atrial fibrillation: Secondary | ICD-10-CM | POA: Diagnosis not present

## 2022-11-28 DIAGNOSIS — R2681 Unsteadiness on feet: Secondary | ICD-10-CM | POA: Diagnosis not present

## 2022-11-28 DIAGNOSIS — M6281 Muscle weakness (generalized): Secondary | ICD-10-CM | POA: Diagnosis not present

## 2022-11-29 DIAGNOSIS — K219 Gastro-esophageal reflux disease without esophagitis: Secondary | ICD-10-CM | POA: Diagnosis not present

## 2022-11-29 DIAGNOSIS — I5020 Unspecified systolic (congestive) heart failure: Secondary | ICD-10-CM | POA: Diagnosis not present

## 2022-11-29 DIAGNOSIS — I639 Cerebral infarction, unspecified: Secondary | ICD-10-CM | POA: Diagnosis not present

## 2022-11-29 DIAGNOSIS — I4891 Unspecified atrial fibrillation: Secondary | ICD-10-CM | POA: Diagnosis not present

## 2022-11-29 DIAGNOSIS — R1310 Dysphagia, unspecified: Secondary | ICD-10-CM | POA: Diagnosis not present

## 2022-11-30 DIAGNOSIS — I4891 Unspecified atrial fibrillation: Secondary | ICD-10-CM | POA: Diagnosis not present

## 2022-11-30 DIAGNOSIS — I639 Cerebral infarction, unspecified: Secondary | ICD-10-CM | POA: Diagnosis not present

## 2022-11-30 DIAGNOSIS — K219 Gastro-esophageal reflux disease without esophagitis: Secondary | ICD-10-CM | POA: Diagnosis not present

## 2022-11-30 DIAGNOSIS — R1310 Dysphagia, unspecified: Secondary | ICD-10-CM | POA: Diagnosis not present

## 2022-11-30 DIAGNOSIS — I5020 Unspecified systolic (congestive) heart failure: Secondary | ICD-10-CM | POA: Diagnosis not present

## 2022-12-03 ENCOUNTER — Telehealth: Payer: Self-pay | Admitting: *Deleted

## 2022-12-03 DIAGNOSIS — Z7984 Long term (current) use of oral hypoglycemic drugs: Secondary | ICD-10-CM | POA: Diagnosis not present

## 2022-12-03 DIAGNOSIS — G250 Essential tremor: Secondary | ICD-10-CM | POA: Diagnosis not present

## 2022-12-03 DIAGNOSIS — E46 Unspecified protein-calorie malnutrition: Secondary | ICD-10-CM | POA: Diagnosis not present

## 2022-12-03 DIAGNOSIS — I4819 Other persistent atrial fibrillation: Secondary | ICD-10-CM | POA: Diagnosis not present

## 2022-12-03 DIAGNOSIS — Z978 Presence of other specified devices: Secondary | ICD-10-CM | POA: Diagnosis not present

## 2022-12-03 DIAGNOSIS — G9340 Encephalopathy, unspecified: Secondary | ICD-10-CM | POA: Diagnosis not present

## 2022-12-03 DIAGNOSIS — I340 Nonrheumatic mitral (valve) insufficiency: Secondary | ICD-10-CM | POA: Diagnosis not present

## 2022-12-03 DIAGNOSIS — Z9181 History of falling: Secondary | ICD-10-CM | POA: Diagnosis not present

## 2022-12-03 DIAGNOSIS — E782 Mixed hyperlipidemia: Secondary | ICD-10-CM | POA: Diagnosis not present

## 2022-12-03 DIAGNOSIS — I959 Hypotension, unspecified: Secondary | ICD-10-CM | POA: Diagnosis not present

## 2022-12-03 DIAGNOSIS — I5022 Chronic systolic (congestive) heart failure: Secondary | ICD-10-CM | POA: Diagnosis not present

## 2022-12-03 DIAGNOSIS — I2510 Atherosclerotic heart disease of native coronary artery without angina pectoris: Secondary | ICD-10-CM | POA: Diagnosis not present

## 2022-12-03 DIAGNOSIS — Z7901 Long term (current) use of anticoagulants: Secondary | ICD-10-CM | POA: Diagnosis not present

## 2022-12-03 DIAGNOSIS — I69398 Other sequelae of cerebral infarction: Secondary | ICD-10-CM | POA: Diagnosis not present

## 2022-12-03 DIAGNOSIS — I110 Hypertensive heart disease with heart failure: Secondary | ICD-10-CM | POA: Diagnosis not present

## 2022-12-03 DIAGNOSIS — Z951 Presence of aortocoronary bypass graft: Secondary | ICD-10-CM | POA: Diagnosis not present

## 2022-12-03 DIAGNOSIS — M150 Primary generalized (osteo)arthritis: Secondary | ICD-10-CM | POA: Diagnosis not present

## 2022-12-03 DIAGNOSIS — H6123 Impacted cerumen, bilateral: Secondary | ICD-10-CM | POA: Diagnosis not present

## 2022-12-03 DIAGNOSIS — E119 Type 2 diabetes mellitus without complications: Secondary | ICD-10-CM | POA: Diagnosis not present

## 2022-12-03 DIAGNOSIS — K219 Gastro-esophageal reflux disease without esophagitis: Secondary | ICD-10-CM | POA: Diagnosis not present

## 2022-12-03 DIAGNOSIS — R1310 Dysphagia, unspecified: Secondary | ICD-10-CM | POA: Diagnosis not present

## 2022-12-03 DIAGNOSIS — R531 Weakness: Secondary | ICD-10-CM | POA: Diagnosis not present

## 2022-12-03 DIAGNOSIS — Z87440 Personal history of urinary (tract) infections: Secondary | ICD-10-CM | POA: Diagnosis not present

## 2022-12-03 NOTE — Telephone Encounter (Signed)
Virginia [name] PT w/ [facility] when seeing her today her HR was irregular at end of visit it was 42 BP 125/66 & BS 189, she had no other signs of distress, she is forgetful, son fixes pill box but at times forgets if she took her meds, lives w/ her daughter who has down's syndrome. Does have family who comes by at times. Has a trace amount of swelling in R leg and 2+ in L leg, pt would not let PT therapist do skin/wound assessment said she did not have any wounds/sores. VO given for nurse & SW eval. Appt made w/ Dois Davenport for 12/04/22

## 2022-12-04 ENCOUNTER — Ambulatory Visit (INDEPENDENT_AMBULATORY_CARE_PROVIDER_SITE_OTHER): Payer: Medicare Other | Admitting: Family

## 2022-12-04 ENCOUNTER — Encounter: Payer: Self-pay | Admitting: Nurse Practitioner

## 2022-12-04 VITALS — BP 92/60 | HR 70 | Temp 97.6°F | Ht 64.0 in | Wt 108.0 lb

## 2022-12-04 DIAGNOSIS — I2583 Coronary atherosclerosis due to lipid rich plaque: Secondary | ICD-10-CM

## 2022-12-04 DIAGNOSIS — I2510 Atherosclerotic heart disease of native coronary artery without angina pectoris: Secondary | ICD-10-CM

## 2022-12-04 DIAGNOSIS — I482 Chronic atrial fibrillation, unspecified: Secondary | ICD-10-CM

## 2022-12-04 DIAGNOSIS — I499 Cardiac arrhythmia, unspecified: Secondary | ICD-10-CM

## 2022-12-04 DIAGNOSIS — I10 Essential (primary) hypertension: Secondary | ICD-10-CM | POA: Diagnosis not present

## 2022-12-04 DIAGNOSIS — R051 Acute cough: Secondary | ICD-10-CM | POA: Diagnosis not present

## 2022-12-04 DIAGNOSIS — E1169 Type 2 diabetes mellitus with other specified complication: Secondary | ICD-10-CM

## 2022-12-04 DIAGNOSIS — I959 Hypotension, unspecified: Secondary | ICD-10-CM | POA: Diagnosis not present

## 2022-12-04 DIAGNOSIS — E782 Mixed hyperlipidemia: Secondary | ICD-10-CM | POA: Diagnosis not present

## 2022-12-04 DIAGNOSIS — Z09 Encounter for follow-up examination after completed treatment for conditions other than malignant neoplasm: Secondary | ICD-10-CM | POA: Diagnosis not present

## 2022-12-04 DIAGNOSIS — E46 Unspecified protein-calorie malnutrition: Secondary | ICD-10-CM | POA: Diagnosis not present

## 2022-12-04 DIAGNOSIS — R531 Weakness: Secondary | ICD-10-CM | POA: Diagnosis not present

## 2022-12-04 LAB — CBC WITH DIFFERENTIAL/PLATELET
EOS (ABSOLUTE): 0.1 10*3/uL (ref 0.0–0.4)
Hematocrit: 44.7 % (ref 34.0–46.6)
Immature Granulocytes: 0 %
Neutrophils Absolute: 5.8 10*3/uL (ref 1.4–7.0)
Platelets: 184 10*3/uL (ref 150–450)

## 2022-12-04 LAB — CMP14+EGFR
ALT: 18 IU/L (ref 0–32)
eGFR: 63 mL/min/{1.73_m2} (ref >59)

## 2022-12-04 MED ORDER — ENSURE ENLIVE PO LIQD
237.0000 mL | Freq: Three times a day (TID) | ORAL | 12 refills | Status: DC
Start: 2022-12-04 — End: 2023-07-17

## 2022-12-04 MED ORDER — MIRTAZAPINE 7.5 MG PO TABS
7.5000 mg | ORAL_TABLET | Freq: Every day | ORAL | 1 refills | Status: DC
Start: 2022-12-04 — End: 2022-12-18

## 2022-12-04 NOTE — Progress Notes (Signed)
   Acute Office Visit  Subjective:     Patient ID: Virginia [name], female    DOB: [birth year], 82 y.o.   MRN: [id number]  Chief Complaint  Patient presents with   Irregular Heart Beat    Has been going on for about week and a half.   Weight Loss    Was 122lb on 5/27 is now down to 108lb    HPI Got home  Friday from rehab, had a visiting nurse at home yesterday that reports her heart was low she needed  to be seen. At the office she was unable to seat,  ROS Negative unless indicated in HPI    Objective:    BP 92/60   Pulse 70   Temp 97.6 F (36.4 C) (Temporal)   Ht 5\' 4"  (1.626 m)   Wt 108 lb (49 kg)   SpO2 96%   BMI 18.54 kg/m  BP Readings from Last 3 Encounters:  12/04/22 92/60  10/08/22 (!) 94/49  08/24/22 110/68   Wt Readings from Last 3 Encounters:  12/04/22 108 lb (49 kg)  08/24/22 144 lb (65.3 kg)  08/10/22 155 lb 9.6 oz (70.6 kg)      Physical Exam  No results found for any visits on 12/04/22.      Assessment & Plan:  Irregular heart beat -     EKG 12-Lead    No follow-ups on file.  Arrie Aran Santa Lighter, NP

## 2022-12-04 NOTE — Progress Notes (Signed)
TC back to [place] PT w/ [facility] to go ahead w/ nursing eval and to hold PT for 1 wk and do another eval then. [place] [facility] had also done an APS call last pm and they are going to pick her up from a safety stand point.

## 2022-12-04 NOTE — Patient Instructions (Signed)
High-Protein and High-Calorie Diet Eating high-protein and high-calorie foods can help you to gain weight, heal after an injury, and recover after an illness or surgery. The specific amount of daily protein and calories you need depends on: Your body weight. The reason this diet is recommended for you. Generally, a high-protein, high-calorie diet involves: Eating 250-500 extra calories each day. Making sure that you get enough of your daily calories from protein. Ask your health care provider how many of your calories should come from protein. Talk with a health care provider or a dietitian about how much protein and how many calories you need each day. Follow the diet as directed by your health care provider. What are tips for following this plan?  Reading food labels Check the nutrition facts label for calories, grams of fat and protein. Items with more than 4 grams of protein are high-protein foods. Preparing meals Add whole milk, half-and-half, or heavy cream to cereal, pudding, soup, or hot cocoa. Add whole milk to instant breakfast drinks. Add peanut butter to oatmeal or smoothies. Add powdered milk to baked goods, smoothies, or milkshakes. Add powdered milk, cream, or butter to mashed potatoes. Add cheese to cooked vegetables. Make whole-milk yogurt parfaits. Top them with granola, fruit, or nuts. Add cottage cheese to fruit. Add avocado, cheese, or both to sandwiches or salads. Add avocado to smoothies. Add meat, poultry, or seafood to rice, pasta, casseroles, salads, and soups. Use mayonnaise when making egg salad, chicken salad, or tuna salad. Use peanut butter as a dip for fruits and vegetables or as a topping for pretzels, celery, or crackers. Add beans to casseroles, dips, and spreads. Add pureed beans to sauces and soups. Replace calorie-free drinks with calorie-containing drinks, such as milk and fruit juice. Replace water with milk or heavy cream when making foods such as  oatmeal, pudding, or cocoa. Add oil or butter to cooked vegetables and grains. Add cream cheese to sandwiches or as a topping on crackers and bread. Make cream-based pastas and soups. General information Ask your health care provider if you should take a nutritional supplement. Try to eat six small meals each day instead of three large meals. A general goal is to eat every 2 to 3 hours. Eat a balanced diet. In each meal, include one food that is high in protein and one food with fat in it. Keep nutritious snacks available, such as nuts, trail mixes, dried fruit, and yogurt. If you have kidney disease or diabetes, talk with your health care provider about how much protein is safe for you. Too much protein may put extra stress on your kidneys. Drink your calories. Choose high-calorie drinks and have them after your meals. Consider setting a timer to remind you to eat. You will want to eat even if you do not feel very hungry. What high-protein foods should I eat?  Vegetables Soybeans. Peas. Grains Quinoa. Bulgur wheat. Buckwheat. Meats and other proteins Beef, pork, and poultry. Fish and seafood. Eggs. Tofu. Textured vegetable protein (TVP). Peanut butter. Nuts and seeds. Dried beans. Protein powders. Hummus. Dairy Whole milk. Whole-milk yogurt. Powdered milk. Cheese. Cottage Cheese. Eggnog. Beverages High-protein supplement drinks. Soy milk. Other foods Protein bars. The items listed above may not be a complete list of foods and beverages you can eat and drink. Contact a dietitian for more information. What high-calorie foods should I eat? Fruits Dried fruit. Fruit leather. Canned fruit in syrup. Fruit juice. Avocado. Vegetables Vegetables cooked in oil or butter. Fried potatoes. Grains   Pasta. Quick breads. Muffins. Pancakes. Ready-to-eat cereal. Meats and other proteins Peanut butter. Nuts and seeds. Dairy Heavy cream. Whipped cream. Cream cheese. Sour cream. Ice cream. Custard.  Pudding. Whole milk dairy products. Beverages Meal-replacement beverages. Nutrition shakes. Fruit juice. Seasonings and condiments Salad dressing. Mayonnaise. Alfredo sauce. Fruit preserves or jelly. Honey. Syrup. Sweets and desserts Cake. Cookies. Pie. Pastries. Candy bars. Chocolate. Fats and oils Butter or margarine. Oil. Gravy. Other foods Meal-replacement bars. The items listed above may not be a complete list of foods and beverages you can eat and drink. Contact a dietitian for more information. Summary A high-protein, high-calorie diet can help you gain weight or heal faster after an injury, illness, or surgery. To increase your protein and calories, add ingredients such as whole milk, peanut butter, cheese, beans, meat, or seafood to meal items. To get enough extra calories each day, include high-calorie foods and beverages at each meal. Adding a high-calorie drink or shake can be an easy way to help you get enough calories each day. Talk with your healthcare provider or dietitian about the best options for you. This information is not intended to replace advice given to you by your health care provider. Make sure you discuss any questions you have with your health care provider. Document Revised: 05/06/2020 Document Reviewed: 05/08/2020 Elsevier Patient Education  2024 [employer]  

## 2022-12-04 NOTE — Progress Notes (Signed)
Subjective:    Patient ID: Virginia [name], female    DOB: Feb 13, 1941, 82 y.o.   MRN: [id number]  Chief Complaint  Patient presents with   Irregular Heart Beat    Has been going on for about week and a half.   Weight Loss    Was 122lb on 5/27 is now down to 108lb   Pt presents to the office today for hospital follow up. Her sister brings her in today. She went to the ED on 11/11/22 with confusion and  A Fib. Was diagnosed with UTI and had a MRI brain that showed, "Multiple punctate acute infarcts in the bilateral frontal, parietal,  and occipital lobes, as well as the left temporal lobe, left  thalamus, and right basal ganglia. Given multiple vascular  territories a central embolic etiology is suspected. "  She was discharged from hospital 11/20/22 to Rehab. She was discharged from Rehab on 11/30/22. She had an evaluation yesterday from Home Health yesterday. Has not started PT.   Pt lives at home with her Down Syndrome daughter.   She has lost 47 lbs since 08/10/22. Has not appetite.     12/04/2022    8:20 AM 08/24/2022    2:02 PM 08/10/2022    2:34 PM  Last 3 Weights  Weight (lbs) 108 lb 144 lb 155 lb 9.6 oz  Weight (kg) 48.988 kg 65.318 kg 70.58 kg    She has A Fib and currently taking Eliquis.  Diabetes She presents for her follow-up diabetic visit. She has type 2 diabetes mellitus. Pertinent negatives for diabetes include no blurred vision and no foot paresthesias. Symptoms are stable. Diabetic complications include a CVA and heart disease. Risk factors for coronary artery disease include dyslipidemia, diabetes mellitus, hypertension, sedentary lifestyle and post-menopausal. (Does not check regularly)  Hyperlipidemia This is a chronic problem. The current episode started more than 1 year ago. Pertinent negatives include no shortness of breath. Current antihyperlipidemic treatment includes statins. Risk factors for coronary artery disease include dyslipidemia, diabetes mellitus,  hypertension, a sedentary lifestyle and post-menopausal.  Hypertension This is a chronic problem. The current episode started more than 1 year ago. The problem has been resolved since onset. Associated symptoms include malaise/fatigue. Pertinent negatives include no blurred vision, peripheral edema or shortness of breath. Risk factors for coronary artery disease include diabetes mellitus, dyslipidemia, obesity and sedentary lifestyle. Past treatments include beta blockers. The current treatment provides moderate improvement. Hypertensive end-organ damage includes CVA.  Cough This is a new problem. The current episode started 1 to 4 weeks ago. The problem has been unchanged. The cough is Non-productive. Pertinent negatives include no shortness of breath.      Review of Systems  Constitutional:  Positive for malaise/fatigue.  Eyes:  Negative for blurred vision.  Respiratory:  Positive for cough. Negative for shortness of breath.   All other systems reviewed and are negative.      Objective:   Physical Exam Vitals reviewed.  Constitutional:      General: She is not in acute distress.    Appearance: She is well-developed. She is cachectic.  HENT:     Head: Normocephalic and atraumatic.     Right Ear: Tympanic membrane normal.     Left Ear: Tympanic membrane normal.  Eyes:     Pupils: Pupils are equal, round, and reactive to light.  Neck:     Thyroid: No thyromegaly.  Cardiovascular:     Rate and Rhythm: Normal rate and regular rhythm.  Heart sounds: Normal heart sounds. No murmur heard. Pulmonary:     Effort: Pulmonary effort is normal. No respiratory distress.     Breath sounds: Normal breath sounds. No wheezing.  Abdominal:     General: Bowel sounds are normal. There is no distension.     Palpations: Abdomen is soft.     Tenderness: There is no abdominal tenderness.  Musculoskeletal:        General: No tenderness. Normal range of motion.     Cervical back: Normal range of  motion and neck supple.  Skin:    General: Skin is warm and dry.  Neurological:     Mental Status: She is alert and oriented to person, place, and time.     Cranial Nerves: No cranial nerve deficit.     Motor: Weakness present.     Gait: Gait abnormal.     Deep Tendon Reflexes: Reflexes are normal and symmetric.     Comments: Generalized weakness, in wheel chair, essential tremor  Psychiatric:        Behavior: Behavior normal.        Thought Content: Thought content normal.        Judgment: Judgment normal.      BP 92/60   Pulse 70   Temp 97.6 F (36.4 C) (Temporal)   Ht 5\' 4"  (1.626 m)   Wt 108 lb (49 kg)   SpO2 96%   BMI 18.54 kg/m      Assessment & Plan:  [name] comes in today with chief complaint of Irregular Heart Beat (Has been going on for about week and a half.) and Weight Loss (Was 122lb on 5/27 is now down to 108lb)   Diagnosis and orders addressed:  1. Irregular heart beat - CMP14+EGFR - CBC with Differential/Platelet  2. Coronary artery disease due to lipid rich plaque - CMP14+EGFR - CBC with Differential/Platelet  3. Chronic atrial fibrillation (HCC) - CMP14+EGFR - CBC with Differential/Platelet - TSH - EKG 12-Lead  4. Mixed hyperlipidemia - CMP14+EGFR - CBC with Differential/Platelet  5. Primary hypertension - CMP14+EGFR - CBC with Differential/Platelet  6. Type 2 diabetes mellitus with other specified complication, without long-term current use of insulin (HCC) - CMP14+EGFR - CBC with Differential/Platelet  7. Hospital discharge follow-up - CMP14+EGFR - CBC with Differential/Platelet  8. Weakness - CMP14+EGFR - CBC with Differential/Platelet  9. Protein malnutrition (HCC) - CMP14+EGFR - CBC with Differential/Platelet - mirtazapine (REMERON) 7.5 MG tablet; Take 1 tablet (7.5 mg total) by mouth at bedtime.  Dispense: 90 tablet; Refill: 1 - feeding supplement (ENSURE ENLIVE / ENSURE PLUS) LIQD; Take 237 mLs by mouth 3  (three) times daily between meals.  Dispense: 21330 mL; Refill: 12   10. Hypotension, unspecified hypotension type Will hold lasix if taking Sister will call to verify medications   11. Acute cough Mucinex as needed    Sister will check at home to verify medications. If she is taking lasix 20 mg daily, we will start to hold until >3 lbs a day or >5 lb in a week. No edema noted on exam today. She is hypotensive today.  If she is not taking lasix will need to decrease Metoprolol. Keep follow up with Cardiologists  Start Remeron 7.5 mg nightly High protein diet, Ensure TID Her son does medications and sister helps when she can. Pt lives at home with down syndrome daughter. Home health worried about home environment. Will need to discuss SNF if weakness continues.  Labs pending Health  Maintenance reviewed Diet and exercise encouraged Approx 43 mins spent with patient, charting, and educating.  Follow up plan: 2 weeks    Jannifer Rodney, FNP

## 2022-12-05 LAB — CBC WITH DIFFERENTIAL/PLATELET
Basophils Absolute: 0.1 10*3/uL (ref 0.0–0.2)
Basos: 1 %
Eos: 2 %
Hemoglobin: 15.8 g/dL (ref 11.1–15.9)
Immature Grans (Abs): 0 10*3/uL (ref 0.0–0.1)
Lymphocytes Absolute: 1.5 10*3/uL (ref 0.7–3.1)
Lymphs: 19 %
MCH: 32.6 pg (ref 26.6–33.0)
MCHC: 35.3 g/dL (ref 31.5–35.7)
MCV: 92 fL (ref 79–97)
Monocytes Absolute: 0.5 10*3/uL (ref 0.1–0.9)
Monocytes: 7 %
Neutrophils: 71 %
RBC: 4.84 x10E6/uL (ref 3.77–5.28)
RDW: 12.6 % (ref 11.7–15.4)
WBC: 8 10*3/uL (ref 3.4–10.8)

## 2022-12-05 LAB — CMP14+EGFR
AST: 27 IU/L (ref 0–40)
Albumin: 4.4 g/dL (ref 3.7–4.7)
Alkaline Phosphatase: 111 IU/L (ref 44–121)
BUN/Creatinine Ratio: 31 — ABNORMAL HIGH (ref 12–28)
BUN: 28 mg/dL — ABNORMAL HIGH (ref 8–27)
Bilirubin Total: 1.3 mg/dL — ABNORMAL HIGH (ref 0.0–1.2)
CO2: 23 mmol/L (ref 20–29)
Calcium: 10.5 mg/dL — ABNORMAL HIGH (ref 8.7–10.3)
Chloride: 96 mmol/L (ref 96–106)
Creatinine, Ser: 0.91 mg/dL (ref 0.57–1.00)
Globulin, Total: 2.5 g/dL (ref 1.5–4.5)
Glucose: 176 mg/dL — ABNORMAL HIGH (ref 70–99)
Potassium: 4.2 mmol/L (ref 3.5–5.2)
Sodium: 136 mmol/L (ref 134–144)

## 2022-12-05 LAB — TSH: TSH: 2.11 u[IU]/mL (ref 0.450–4.500)

## 2022-12-06 ENCOUNTER — Telehealth: Payer: Self-pay | Admitting: Family

## 2022-12-06 NOTE — Telephone Encounter (Signed)
Virginia [name] spoke with [place] on 6/19 about stopping physical therapy and wanted to clarify that it was okay for a social worker to still go out and evaluate the patient next week.   Also stated that the patient mentioned to her that her allergies were still bothering her, she was just seen in the office on 6/18 and that she did mention it while she was here. She is having a cough and congestion with white based phlegm. When it started it was a moderate amount and now it is only a small amount but she still feels about the same, not getting any worse or better. Per Virginia [name], after checking her medications, she is not currently on any kind of allergy medication at this time.

## 2022-12-07 ENCOUNTER — Telehealth: Payer: Self-pay | Admitting: Family

## 2022-12-07 DIAGNOSIS — G9340 Encephalopathy, unspecified: Secondary | ICD-10-CM | POA: Diagnosis not present

## 2022-12-07 DIAGNOSIS — M150 Primary generalized (osteo)arthritis: Secondary | ICD-10-CM | POA: Diagnosis not present

## 2022-12-07 DIAGNOSIS — Z7984 Long term (current) use of oral hypoglycemic drugs: Secondary | ICD-10-CM | POA: Diagnosis not present

## 2022-12-07 DIAGNOSIS — H6123 Impacted cerumen, bilateral: Secondary | ICD-10-CM | POA: Diagnosis not present

## 2022-12-07 DIAGNOSIS — G250 Essential tremor: Secondary | ICD-10-CM | POA: Diagnosis not present

## 2022-12-07 DIAGNOSIS — Z87440 Personal history of urinary (tract) infections: Secondary | ICD-10-CM | POA: Diagnosis not present

## 2022-12-07 DIAGNOSIS — I959 Hypotension, unspecified: Secondary | ICD-10-CM | POA: Diagnosis not present

## 2022-12-07 DIAGNOSIS — K219 Gastro-esophageal reflux disease without esophagitis: Secondary | ICD-10-CM | POA: Diagnosis not present

## 2022-12-07 DIAGNOSIS — R1310 Dysphagia, unspecified: Secondary | ICD-10-CM | POA: Diagnosis not present

## 2022-12-07 DIAGNOSIS — E46 Unspecified protein-calorie malnutrition: Secondary | ICD-10-CM | POA: Diagnosis not present

## 2022-12-07 DIAGNOSIS — I69398 Other sequelae of cerebral infarction: Secondary | ICD-10-CM | POA: Diagnosis not present

## 2022-12-07 DIAGNOSIS — E782 Mixed hyperlipidemia: Secondary | ICD-10-CM | POA: Diagnosis not present

## 2022-12-07 DIAGNOSIS — I340 Nonrheumatic mitral (valve) insufficiency: Secondary | ICD-10-CM | POA: Diagnosis not present

## 2022-12-07 DIAGNOSIS — I110 Hypertensive heart disease with heart failure: Secondary | ICD-10-CM | POA: Diagnosis not present

## 2022-12-07 DIAGNOSIS — I5022 Chronic systolic (congestive) heart failure: Secondary | ICD-10-CM | POA: Diagnosis not present

## 2022-12-07 DIAGNOSIS — E119 Type 2 diabetes mellitus without complications: Secondary | ICD-10-CM | POA: Diagnosis not present

## 2022-12-07 DIAGNOSIS — I4819 Other persistent atrial fibrillation: Secondary | ICD-10-CM | POA: Diagnosis not present

## 2022-12-07 DIAGNOSIS — I2510 Atherosclerotic heart disease of native coronary artery without angina pectoris: Secondary | ICD-10-CM | POA: Diagnosis not present

## 2022-12-07 DIAGNOSIS — R531 Weakness: Secondary | ICD-10-CM | POA: Diagnosis not present

## 2022-12-07 DIAGNOSIS — Z7901 Long term (current) use of anticoagulants: Secondary | ICD-10-CM | POA: Diagnosis not present

## 2022-12-07 DIAGNOSIS — Z978 Presence of other specified devices: Secondary | ICD-10-CM | POA: Diagnosis not present

## 2022-12-07 DIAGNOSIS — Z951 Presence of aortocoronary bypass graft: Secondary | ICD-10-CM | POA: Diagnosis not present

## 2022-12-07 DIAGNOSIS — Z9181 History of falling: Secondary | ICD-10-CM | POA: Diagnosis not present

## 2022-12-07 MED ORDER — CETIRIZINE HCL 10 MG PO TABS: 10.0000 mg | ORAL_TABLET | Freq: Every day | ORAL | 1 refills | Status: DC

## 2022-12-07 MED ORDER — AZITHROMYCIN 250 MG PO TABS: ORAL_TABLET | ORAL | 0 refills | Status: DC

## 2022-12-07 NOTE — Telephone Encounter (Signed)
SISTER AWARE.

## 2022-12-07 NOTE — Telephone Encounter (Signed)
Zpak and zyrtec Prescription sent to pharmacy.   I believe patient would benefit from starting PT.   Jannifer Rodney, FNP

## 2022-12-07 NOTE — Telephone Encounter (Signed)
LMOVM giving VO for OT

## 2022-12-10 DIAGNOSIS — G9340 Encephalopathy, unspecified: Secondary | ICD-10-CM | POA: Diagnosis not present

## 2022-12-10 DIAGNOSIS — G250 Essential tremor: Secondary | ICD-10-CM | POA: Diagnosis not present

## 2022-12-10 DIAGNOSIS — Z7984 Long term (current) use of oral hypoglycemic drugs: Secondary | ICD-10-CM | POA: Diagnosis not present

## 2022-12-10 DIAGNOSIS — Z978 Presence of other specified devices: Secondary | ICD-10-CM | POA: Diagnosis not present

## 2022-12-10 DIAGNOSIS — R531 Weakness: Secondary | ICD-10-CM | POA: Diagnosis not present

## 2022-12-10 DIAGNOSIS — I69398 Other sequelae of cerebral infarction: Secondary | ICD-10-CM | POA: Diagnosis not present

## 2022-12-10 DIAGNOSIS — I4819 Other persistent atrial fibrillation: Secondary | ICD-10-CM | POA: Diagnosis not present

## 2022-12-10 DIAGNOSIS — I340 Nonrheumatic mitral (valve) insufficiency: Secondary | ICD-10-CM | POA: Diagnosis not present

## 2022-12-10 DIAGNOSIS — I5022 Chronic systolic (congestive) heart failure: Secondary | ICD-10-CM | POA: Diagnosis not present

## 2022-12-10 DIAGNOSIS — M150 Primary generalized (osteo)arthritis: Secondary | ICD-10-CM | POA: Diagnosis not present

## 2022-12-10 DIAGNOSIS — Z87440 Personal history of urinary (tract) infections: Secondary | ICD-10-CM | POA: Diagnosis not present

## 2022-12-10 DIAGNOSIS — E782 Mixed hyperlipidemia: Secondary | ICD-10-CM | POA: Diagnosis not present

## 2022-12-10 DIAGNOSIS — K219 Gastro-esophageal reflux disease without esophagitis: Secondary | ICD-10-CM | POA: Diagnosis not present

## 2022-12-10 DIAGNOSIS — I2510 Atherosclerotic heart disease of native coronary artery without angina pectoris: Secondary | ICD-10-CM | POA: Diagnosis not present

## 2022-12-10 DIAGNOSIS — H6123 Impacted cerumen, bilateral: Secondary | ICD-10-CM | POA: Diagnosis not present

## 2022-12-10 DIAGNOSIS — R1310 Dysphagia, unspecified: Secondary | ICD-10-CM | POA: Diagnosis not present

## 2022-12-10 DIAGNOSIS — Z9181 History of falling: Secondary | ICD-10-CM | POA: Diagnosis not present

## 2022-12-10 DIAGNOSIS — Z951 Presence of aortocoronary bypass graft: Secondary | ICD-10-CM | POA: Diagnosis not present

## 2022-12-10 DIAGNOSIS — E46 Unspecified protein-calorie malnutrition: Secondary | ICD-10-CM | POA: Diagnosis not present

## 2022-12-10 DIAGNOSIS — I959 Hypotension, unspecified: Secondary | ICD-10-CM | POA: Diagnosis not present

## 2022-12-10 DIAGNOSIS — E119 Type 2 diabetes mellitus without complications: Secondary | ICD-10-CM | POA: Diagnosis not present

## 2022-12-10 DIAGNOSIS — I110 Hypertensive heart disease with heart failure: Secondary | ICD-10-CM | POA: Diagnosis not present

## 2022-12-10 DIAGNOSIS — Z7901 Long term (current) use of anticoagulants: Secondary | ICD-10-CM | POA: Diagnosis not present

## 2022-12-11 DIAGNOSIS — H6123 Impacted cerumen, bilateral: Secondary | ICD-10-CM | POA: Diagnosis not present

## 2022-12-11 DIAGNOSIS — G9340 Encephalopathy, unspecified: Secondary | ICD-10-CM | POA: Diagnosis not present

## 2022-12-11 DIAGNOSIS — M150 Primary generalized (osteo)arthritis: Secondary | ICD-10-CM | POA: Diagnosis not present

## 2022-12-11 DIAGNOSIS — E782 Mixed hyperlipidemia: Secondary | ICD-10-CM | POA: Diagnosis not present

## 2022-12-11 DIAGNOSIS — Z9181 History of falling: Secondary | ICD-10-CM | POA: Diagnosis not present

## 2022-12-11 DIAGNOSIS — I69398 Other sequelae of cerebral infarction: Secondary | ICD-10-CM | POA: Diagnosis not present

## 2022-12-11 DIAGNOSIS — R531 Weakness: Secondary | ICD-10-CM | POA: Diagnosis not present

## 2022-12-11 DIAGNOSIS — Z951 Presence of aortocoronary bypass graft: Secondary | ICD-10-CM | POA: Diagnosis not present

## 2022-12-11 DIAGNOSIS — I110 Hypertensive heart disease with heart failure: Secondary | ICD-10-CM | POA: Diagnosis not present

## 2022-12-11 DIAGNOSIS — E119 Type 2 diabetes mellitus without complications: Secondary | ICD-10-CM | POA: Diagnosis not present

## 2022-12-11 DIAGNOSIS — K219 Gastro-esophageal reflux disease without esophagitis: Secondary | ICD-10-CM | POA: Diagnosis not present

## 2022-12-11 DIAGNOSIS — I5022 Chronic systolic (congestive) heart failure: Secondary | ICD-10-CM | POA: Diagnosis not present

## 2022-12-11 DIAGNOSIS — I4819 Other persistent atrial fibrillation: Secondary | ICD-10-CM | POA: Diagnosis not present

## 2022-12-11 DIAGNOSIS — I340 Nonrheumatic mitral (valve) insufficiency: Secondary | ICD-10-CM | POA: Diagnosis not present

## 2022-12-11 DIAGNOSIS — Z7984 Long term (current) use of oral hypoglycemic drugs: Secondary | ICD-10-CM | POA: Diagnosis not present

## 2022-12-11 DIAGNOSIS — I959 Hypotension, unspecified: Secondary | ICD-10-CM | POA: Diagnosis not present

## 2022-12-11 DIAGNOSIS — E46 Unspecified protein-calorie malnutrition: Secondary | ICD-10-CM | POA: Diagnosis not present

## 2022-12-11 DIAGNOSIS — Z87440 Personal history of urinary (tract) infections: Secondary | ICD-10-CM | POA: Diagnosis not present

## 2022-12-11 DIAGNOSIS — I2510 Atherosclerotic heart disease of native coronary artery without angina pectoris: Secondary | ICD-10-CM | POA: Diagnosis not present

## 2022-12-11 DIAGNOSIS — G250 Essential tremor: Secondary | ICD-10-CM | POA: Diagnosis not present

## 2022-12-11 DIAGNOSIS — Z978 Presence of other specified devices: Secondary | ICD-10-CM | POA: Diagnosis not present

## 2022-12-11 DIAGNOSIS — Z7901 Long term (current) use of anticoagulants: Secondary | ICD-10-CM | POA: Diagnosis not present

## 2022-12-11 DIAGNOSIS — R1310 Dysphagia, unspecified: Secondary | ICD-10-CM | POA: Diagnosis not present

## 2022-12-13 DIAGNOSIS — E46 Unspecified protein-calorie malnutrition: Secondary | ICD-10-CM | POA: Diagnosis not present

## 2022-12-13 DIAGNOSIS — I959 Hypotension, unspecified: Secondary | ICD-10-CM | POA: Diagnosis not present

## 2022-12-13 DIAGNOSIS — M150 Primary generalized (osteo)arthritis: Secondary | ICD-10-CM | POA: Diagnosis not present

## 2022-12-13 DIAGNOSIS — R531 Weakness: Secondary | ICD-10-CM | POA: Diagnosis not present

## 2022-12-13 DIAGNOSIS — G250 Essential tremor: Secondary | ICD-10-CM | POA: Diagnosis not present

## 2022-12-13 DIAGNOSIS — I5022 Chronic systolic (congestive) heart failure: Secondary | ICD-10-CM | POA: Diagnosis not present

## 2022-12-13 DIAGNOSIS — Z978 Presence of other specified devices: Secondary | ICD-10-CM | POA: Diagnosis not present

## 2022-12-13 DIAGNOSIS — Z7901 Long term (current) use of anticoagulants: Secondary | ICD-10-CM | POA: Diagnosis not present

## 2022-12-13 DIAGNOSIS — Z9181 History of falling: Secondary | ICD-10-CM | POA: Diagnosis not present

## 2022-12-13 DIAGNOSIS — R1310 Dysphagia, unspecified: Secondary | ICD-10-CM | POA: Diagnosis not present

## 2022-12-13 DIAGNOSIS — Z7984 Long term (current) use of oral hypoglycemic drugs: Secondary | ICD-10-CM | POA: Diagnosis not present

## 2022-12-13 DIAGNOSIS — I340 Nonrheumatic mitral (valve) insufficiency: Secondary | ICD-10-CM | POA: Diagnosis not present

## 2022-12-13 DIAGNOSIS — G9340 Encephalopathy, unspecified: Secondary | ICD-10-CM | POA: Diagnosis not present

## 2022-12-13 DIAGNOSIS — E782 Mixed hyperlipidemia: Secondary | ICD-10-CM | POA: Diagnosis not present

## 2022-12-13 DIAGNOSIS — I2510 Atherosclerotic heart disease of native coronary artery without angina pectoris: Secondary | ICD-10-CM | POA: Diagnosis not present

## 2022-12-13 DIAGNOSIS — E119 Type 2 diabetes mellitus without complications: Secondary | ICD-10-CM | POA: Diagnosis not present

## 2022-12-13 DIAGNOSIS — I4819 Other persistent atrial fibrillation: Secondary | ICD-10-CM | POA: Diagnosis not present

## 2022-12-13 DIAGNOSIS — Z951 Presence of aortocoronary bypass graft: Secondary | ICD-10-CM | POA: Diagnosis not present

## 2022-12-13 DIAGNOSIS — I69398 Other sequelae of cerebral infarction: Secondary | ICD-10-CM | POA: Diagnosis not present

## 2022-12-13 DIAGNOSIS — Z87440 Personal history of urinary (tract) infections: Secondary | ICD-10-CM | POA: Diagnosis not present

## 2022-12-13 DIAGNOSIS — K219 Gastro-esophageal reflux disease without esophagitis: Secondary | ICD-10-CM | POA: Diagnosis not present

## 2022-12-13 DIAGNOSIS — H6123 Impacted cerumen, bilateral: Secondary | ICD-10-CM | POA: Diagnosis not present

## 2022-12-13 DIAGNOSIS — I110 Hypertensive heart disease with heart failure: Secondary | ICD-10-CM | POA: Diagnosis not present

## 2022-12-16 ENCOUNTER — Other Ambulatory Visit: Payer: Self-pay | Admitting: Family

## 2022-12-16 DIAGNOSIS — R600 Localized edema: Secondary | ICD-10-CM

## 2022-12-18 ENCOUNTER — Telehealth: Payer: Self-pay | Admitting: Family

## 2022-12-18 ENCOUNTER — Ambulatory Visit (INDEPENDENT_AMBULATORY_CARE_PROVIDER_SITE_OTHER): Payer: Medicare Other | Admitting: Family

## 2022-12-18 ENCOUNTER — Encounter: Payer: Self-pay | Admitting: Family

## 2022-12-18 VITALS — BP 138/65 | HR 98 | Temp 96.8°F | Ht 64.0 in | Wt 118.0 lb

## 2022-12-18 DIAGNOSIS — Z9181 History of falling: Secondary | ICD-10-CM | POA: Diagnosis not present

## 2022-12-18 DIAGNOSIS — I959 Hypotension, unspecified: Secondary | ICD-10-CM | POA: Diagnosis not present

## 2022-12-18 DIAGNOSIS — E46 Unspecified protein-calorie malnutrition: Secondary | ICD-10-CM | POA: Diagnosis not present

## 2022-12-18 DIAGNOSIS — I482 Chronic atrial fibrillation, unspecified: Secondary | ICD-10-CM

## 2022-12-18 DIAGNOSIS — I4819 Other persistent atrial fibrillation: Secondary | ICD-10-CM | POA: Diagnosis not present

## 2022-12-18 DIAGNOSIS — G9340 Encephalopathy, unspecified: Secondary | ICD-10-CM | POA: Diagnosis not present

## 2022-12-18 DIAGNOSIS — H6123 Impacted cerumen, bilateral: Secondary | ICD-10-CM | POA: Diagnosis not present

## 2022-12-18 DIAGNOSIS — Z7901 Long term (current) use of anticoagulants: Secondary | ICD-10-CM | POA: Diagnosis not present

## 2022-12-18 DIAGNOSIS — I69398 Other sequelae of cerebral infarction: Secondary | ICD-10-CM | POA: Diagnosis not present

## 2022-12-18 DIAGNOSIS — Z87440 Personal history of urinary (tract) infections: Secondary | ICD-10-CM | POA: Diagnosis not present

## 2022-12-18 DIAGNOSIS — E1169 Type 2 diabetes mellitus with other specified complication: Secondary | ICD-10-CM | POA: Diagnosis not present

## 2022-12-18 DIAGNOSIS — I10 Essential (primary) hypertension: Secondary | ICD-10-CM | POA: Diagnosis not present

## 2022-12-18 DIAGNOSIS — I480 Paroxysmal atrial fibrillation: Secondary | ICD-10-CM

## 2022-12-18 DIAGNOSIS — R600 Localized edema: Secondary | ICD-10-CM | POA: Diagnosis not present

## 2022-12-18 DIAGNOSIS — E782 Mixed hyperlipidemia: Secondary | ICD-10-CM | POA: Diagnosis not present

## 2022-12-18 DIAGNOSIS — I2510 Atherosclerotic heart disease of native coronary artery without angina pectoris: Secondary | ICD-10-CM | POA: Diagnosis not present

## 2022-12-18 DIAGNOSIS — R1310 Dysphagia, unspecified: Secondary | ICD-10-CM | POA: Diagnosis not present

## 2022-12-18 DIAGNOSIS — I2583 Coronary atherosclerosis due to lipid rich plaque: Secondary | ICD-10-CM | POA: Diagnosis not present

## 2022-12-18 DIAGNOSIS — Z951 Presence of aortocoronary bypass graft: Secondary | ICD-10-CM | POA: Diagnosis not present

## 2022-12-18 DIAGNOSIS — I110 Hypertensive heart disease with heart failure: Secondary | ICD-10-CM | POA: Diagnosis not present

## 2022-12-18 DIAGNOSIS — I340 Nonrheumatic mitral (valve) insufficiency: Secondary | ICD-10-CM | POA: Diagnosis not present

## 2022-12-18 DIAGNOSIS — G250 Essential tremor: Secondary | ICD-10-CM | POA: Diagnosis not present

## 2022-12-18 DIAGNOSIS — F331 Major depressive disorder, recurrent, moderate: Secondary | ICD-10-CM

## 2022-12-18 DIAGNOSIS — K219 Gastro-esophageal reflux disease without esophagitis: Secondary | ICD-10-CM | POA: Diagnosis not present

## 2022-12-18 DIAGNOSIS — Z978 Presence of other specified devices: Secondary | ICD-10-CM | POA: Diagnosis not present

## 2022-12-18 DIAGNOSIS — M159 Polyosteoarthritis, unspecified: Secondary | ICD-10-CM

## 2022-12-18 DIAGNOSIS — M150 Primary generalized (osteo)arthritis: Secondary | ICD-10-CM | POA: Diagnosis not present

## 2022-12-18 DIAGNOSIS — I5022 Chronic systolic (congestive) heart failure: Secondary | ICD-10-CM | POA: Diagnosis not present

## 2022-12-18 DIAGNOSIS — R531 Weakness: Secondary | ICD-10-CM | POA: Diagnosis not present

## 2022-12-18 DIAGNOSIS — E119 Type 2 diabetes mellitus without complications: Secondary | ICD-10-CM | POA: Diagnosis not present

## 2022-12-18 DIAGNOSIS — I509 Heart failure, unspecified: Secondary | ICD-10-CM

## 2022-12-18 DIAGNOSIS — Z7984 Long term (current) use of oral hypoglycemic drugs: Secondary | ICD-10-CM | POA: Diagnosis not present

## 2022-12-18 LAB — BAYER DCA HB A1C WAIVED: HB A1C (BAYER DCA - WAIVED): 6.1 % — ABNORMAL HIGH (ref 4.8–5.6)

## 2022-12-18 MED ORDER — PANTOPRAZOLE SODIUM 40 MG PO TBEC
40.0000 mg | DELAYED_RELEASE_TABLET | Freq: Every day | ORAL | 3 refills | Status: AC
Start: 2022-12-18 — End: ?

## 2022-12-18 MED ORDER — METOPROLOL SUCCINATE ER 50 MG PO TB24
ORAL_TABLET | ORAL | 2 refills | Status: DC
Start: 2022-12-18 — End: 2023-05-29

## 2022-12-18 MED ORDER — APIXABAN 5 MG PO TABS: 5.0000 mg | ORAL_TABLET | Freq: Two times a day (BID) | ORAL | 2 refills | Status: AC

## 2022-12-18 MED ORDER — DAPAGLIFLOZIN PROPANEDIOL 10 MG PO TABS: 10.0000 mg | ORAL_TABLET | Freq: Every day | ORAL | 1 refills | Status: DC

## 2022-12-18 MED ORDER — PAROXETINE HCL 10 MG PO TABS
10.0000 mg | ORAL_TABLET | Freq: Every morning | ORAL | 1 refills | Status: DC
Start: 2022-12-18 — End: 2023-06-21

## 2022-12-18 MED ORDER — MIRTAZAPINE 7.5 MG PO TABS
7.5000 mg | ORAL_TABLET | Freq: Every day | ORAL | 1 refills | Status: DC
Start: 2022-12-18 — End: 2023-07-15

## 2022-12-18 NOTE — Telephone Encounter (Signed)
Corwin Levins called from home health requesting clarification on primary diagnosis for patient.  Says right now she has I63.40 but wants to know if there is a better primary diag for pt? If not, they will use that one.   Please advise.

## 2022-12-18 NOTE — Progress Notes (Signed)
Subjective:    Patient ID: Virginia [name], female    DOB: 1940-11-03, 82 y.o.   MRN: [id number]  Chief Complaint  Patient presents with   Medical Management of Chronic Issues    PT presents to the office today for chronic follow up and follow up on weight loss.   She was seen on 12/04/22 and we stopped her lasix related to hypotension. We started her on Remeron 7.5 mg. She has gained 10 lbs back. She has lost 47 lbs since 08/10/22. Denies any SOB, chest pain, or edema. She is drinking Ensure daily.      12/18/2022    3:20 PM 12/04/2022    8:20 AM 08/24/2022    2:02 PM  Last 3 Weights  Weight (lbs) 118 lb 108 lb 144 lb  Weight (kg) 53.524 kg 48.988 kg 65.318 kg    Pt lives at home with her Down Syndrome daughter. PT is working once a week with patient.    She had seen GI for GI Bleed and was cleared. Continue to avoid NSAID's.    She is followed by Cardiologists annually for A Fib, CAD, and HTN. Continues to take Eliquis. These are stable.      She was started on lasix for edema and CHF. Her fluid has greatly improved. She has lost 11 lb.  Hypertension This is a chronic problem. The current episode started more than 1 year ago. The problem has been resolved since onset. The problem is controlled. Associated symptoms include malaise/fatigue. Pertinent negatives include no blurred vision, peripheral edema or shortness of breath. Risk factors for coronary artery disease include sedentary lifestyle. The current treatment provides moderate improvement.  Gastroesophageal Reflux She complains of belching and heartburn. This is a chronic problem. The current episode started more than 1 year ago. The problem occurs occasionally. Associated symptoms include fatigue. She has tried a PPI for the symptoms. The treatment provided moderate relief.  Diabetes She presents for her follow-up diabetic visit. She has type 2 diabetes mellitus. Associated symptoms include fatigue. Pertinent negatives for  diabetes include no blurred vision and no foot paresthesias. Symptoms are stable. Risk factors for coronary artery disease include dyslipidemia, diabetes mellitus, hypertension, sedentary lifestyle and post-menopausal. She is following a generally healthy diet. Her overall blood glucose range is 110-130 mg/dl.  Arthritis Presents for follow-up visit. She complains of pain and stiffness. Affected locations include the left knee, right knee, right MCP and left MCP. Her pain is at a severity of 2/10. Associated symptoms include fatigue.  Depression        This is a chronic problem.  The current episode started more than 1 year ago.   Associated symptoms include fatigue.  Associated symptoms include no helplessness, no hopelessness and not sad. Hyperlipidemia This is a chronic problem. The current episode started more than 1 year ago. Pertinent negatives include no shortness of breath. The current treatment provides moderate improvement of lipids. Risk factors for coronary artery disease include dyslipidemia, diabetes mellitus, hypertension and a sedentary lifestyle.      Review of Systems  Constitutional:  Positive for fatigue and malaise/fatigue.  Eyes:  Negative for blurred vision.  Respiratory:  Negative for shortness of breath.   Gastrointestinal:  Positive for heartburn.  Musculoskeletal:  Positive for arthritis and stiffness.  Psychiatric/Behavioral:  Positive for depression.   All other systems reviewed and are negative.      Objective:   Physical Exam Vitals reviewed.  Constitutional:  General: She is not in acute distress.    Appearance: She is well-developed.  HENT:     Head: Normocephalic and atraumatic.     Right Ear: Tympanic membrane normal.     Left Ear: Tympanic membrane normal.  Eyes:     Pupils: Pupils are equal, round, and reactive to light.  Neck:     Thyroid: No thyromegaly.  Cardiovascular:     Rate and Rhythm: Normal rate and regular rhythm.     Heart  sounds: Normal heart sounds. No murmur heard. Pulmonary:     Effort: Pulmonary effort is normal. No respiratory distress.     Breath sounds: Normal breath sounds. No wheezing.  Abdominal:     General: Bowel sounds are normal. There is no distension.     Palpations: Abdomen is soft.     Tenderness: There is no abdominal tenderness.  Musculoskeletal:        General: No tenderness. Normal range of motion.     Cervical back: Normal range of motion and neck supple.     Right lower leg: Edema (trace) present.     Left lower leg: Edema (trace) present.  Skin:    General: Skin is warm and dry.  Neurological:     Mental Status: She is alert and oriented to person, place, and time.     Cranial Nerves: No cranial nerve deficit.     Motor: Weakness present.     Gait: Gait abnormal.     Deep Tendon Reflexes: Reflexes are normal and symmetric.  Psychiatric:        Behavior: Behavior normal.        Thought Content: Thought content normal.        Judgment: Judgment normal.      BP 138/65   Pulse 98   Temp (!) 96.8 F (36 C) (Temporal)   Ht 5\' 4"  (1.626 m)   Wt 118 lb (53.5 kg)   SpO2 95%   BMI 20.25 kg/m      Assessment & Plan:  Virginia [name] comes in today with chief complaint of Medical Management of Chronic Issues   Diagnosis and orders addressed:  1. Paroxysmal atrial fibrillation (HCC) - apixaban (ELIQUIS) 5 MG TABS tablet; Take 1 tablet (5 mg total) by mouth 2 (two) times daily.  Dispense: 200 tablet; Refill: 2 - CMP14+EGFR  2. Acute on chronic congestive heart failure, unspecified heart failure type (HCC) - dapagliflozin propanediol (FARXIGA) 10 MG TABS tablet; Take 1 tablet (10 mg total) by mouth daily before breakfast.  Dispense: 90 tablet; Refill: 1 - CMP14+EGFR  3. Primary hypertension - CMP14+EGFR  4. Peripheral edema - CMP14+EGFR  5. Chronic atrial fibrillation (HCC) - metoprolol succinate (TOPROL-XL) 50 MG 24 hr tablet; TAKE 1 AND 1/2 TABLETS BY MOUTH   DAILY TAKE WITH OR IMMEDIATELY  FOLLOWING A MEAL  Dispense: 135 tablet; Refill: 2 - CMP14+EGFR  6. Coronary artery disease due to lipid rich plaque - metoprolol succinate (TOPROL-XL) 50 MG 24 hr tablet; TAKE 1 AND 1/2 TABLETS BY MOUTH  DAILY TAKE WITH OR IMMEDIATELY  FOLLOWING A MEAL  Dispense: 135 tablet; Refill: 2 - CMP14+EGFR  7. Protein malnutrition (HCC) - mirtazapine (REMERON) 7.5 MG tablet; Take 1 tablet (7.5 mg total) by mouth at bedtime.  Dispense: 90 tablet; Refill: 1 - CMP14+EGFR  8. Gastroesophageal reflux disease, unspecified whether esophagitis present - pantoprazole (PROTONIX) 40 MG tablet; Take 1 tablet (40 mg total) by mouth daily.  Dispense: 90 tablet; Refill: 3 -  CMP14+EGFR  9. Mixed hyperlipidemia - CMP14+EGFR  10. Moderate episode of recurrent major depressive disorder (HCC) - PARoxetine (PAXIL) 10 MG tablet; Take 1 tablet (10 mg total) by mouth every morning.  Dispense: 90 tablet; Refill: 1 - CMP14+EGFR  11. Primary osteoarthritis involving multiple joints - CMP14+EGFR  12. Type 2 diabetes mellitus with other specified complication, without long-term current use of insulin (HCC) - Bayer DCA Hb A1c Waived - CMP14+EGFR   Labs pending Continue Remeron  Health Maintenance reviewed Diet and exercise encouraged  Follow up plan: 1 month  Jannifer Rodney, FNP

## 2022-12-18 NOTE — Patient Instructions (Signed)
High-Protein and High-Calorie Diet Eating high-protein and high-calorie foods can help you to gain weight, heal after an injury, and recover after an illness or surgery. The specific amount of daily protein and calories you need depends on: Your body weight. The reason this diet is recommended for you. Generally, a high-protein, high-calorie diet involves: Eating 250-500 extra calories each day. Making sure that you get enough of your daily calories from protein. Ask your health care provider how many of your calories should come from protein. Talk with a health care provider or a dietitian about how much protein and how many calories you need each day. Follow the diet as directed by your health care provider. What are tips for following this plan?  Reading food labels Check the nutrition facts label for calories, grams of fat and protein. Items with more than 4 grams of protein are high-protein foods. Preparing meals Add whole milk, half-and-half, or heavy cream to cereal, pudding, soup, or hot cocoa. Add whole milk to instant breakfast drinks. Add peanut butter to oatmeal or smoothies. Add powdered milk to baked goods, smoothies, or milkshakes. Add powdered milk, cream, or butter to mashed potatoes. Add cheese to cooked vegetables. Make whole-milk yogurt parfaits. Top them with granola, fruit, or nuts. Add cottage cheese to fruit. Add avocado, cheese, or both to sandwiches or salads. Add avocado to smoothies. Add meat, poultry, or seafood to rice, pasta, casseroles, salads, and soups. Use mayonnaise when making egg salad, chicken salad, or tuna salad. Use peanut butter as a dip for fruits and vegetables or as a topping for pretzels, celery, or crackers. Add beans to casseroles, dips, and spreads. Add pureed beans to sauces and soups. Replace calorie-free drinks with calorie-containing drinks, such as milk and fruit juice. Replace water with milk or heavy cream when making foods such as  oatmeal, pudding, or cocoa. Add oil or butter to cooked vegetables and grains. Add cream cheese to sandwiches or as a topping on crackers and bread. Make cream-based pastas and soups. General information Ask your health care provider if you should take a nutritional supplement. Try to eat six small meals each day instead of three large meals. A general goal is to eat every 2 to 3 hours. Eat a balanced diet. In each meal, include one food that is high in protein and one food with fat in it. Keep nutritious snacks available, such as nuts, trail mixes, dried fruit, and yogurt. If you have kidney disease or diabetes, talk with your health care provider about how much protein is safe for you. Too much protein may put extra stress on your kidneys. Drink your calories. Choose high-calorie drinks and have them after your meals. Consider setting a timer to remind you to eat. You will want to eat even if you do not feel very hungry. What high-protein foods should I eat?  Vegetables Soybeans. Peas. Grains Quinoa. Bulgur wheat. Buckwheat. Meats and other proteins Beef, pork, and poultry. Fish and seafood. Eggs. Tofu. Textured vegetable protein (TVP). Peanut butter. Nuts and seeds. Dried beans. Protein powders. Hummus. Dairy Whole milk. Whole-milk yogurt. Powdered milk. Cheese. Cottage Cheese. Eggnog. Beverages High-protein supplement drinks. Soy milk. Other foods Protein bars. The items listed above may not be a complete list of foods and beverages you can eat and drink. Contact a dietitian for more information. What high-calorie foods should I eat? Fruits Dried fruit. Fruit leather. Canned fruit in syrup. Fruit juice. Avocado. Vegetables Vegetables cooked in oil or butter. Fried potatoes. Grains   Pasta. Quick breads. Muffins. Pancakes. Ready-to-eat cereal. Meats and other proteins Peanut butter. Nuts and seeds. Dairy Heavy cream. Whipped cream. Cream cheese. Sour cream. Ice cream. Custard.  Pudding. Whole milk dairy products. Beverages Meal-replacement beverages. Nutrition shakes. Fruit juice. Seasonings and condiments Salad dressing. Mayonnaise. Alfredo sauce. Fruit preserves or jelly. Honey. Syrup. Sweets and desserts Cake. Cookies. Pie. Pastries. Candy bars. Chocolate. Fats and oils Butter or margarine. Oil. Gravy. Other foods Meal-replacement bars. The items listed above may not be a complete list of foods and beverages you can eat and drink. Contact a dietitian for more information. Summary A high-protein, high-calorie diet can help you gain weight or heal faster after an injury, illness, or surgery. To increase your protein and calories, add ingredients such as whole milk, peanut butter, cheese, beans, meat, or seafood to meal items. To get enough extra calories each day, include high-calorie foods and beverages at each meal. Adding a high-calorie drink or shake can be an easy way to help you get enough calories each day. Talk with your healthcare provider or dietitian about the best options for you. This information is not intended to replace advice given to you by your health care provider. Make sure you discuss any questions you have with your health care provider. Document Revised: 05/06/2020 Document Reviewed: 05/08/2020 Elsevier Patient Education  2024 [employer]  

## 2022-12-18 NOTE — Telephone Encounter (Signed)
Spoke with them and they are aware to use this code

## 2022-12-19 LAB — CMP14+EGFR
ALT: 21 IU/L (ref 0–32)
AST: 32 IU/L (ref 0–40)
Albumin: 3.6 g/dL — ABNORMAL LOW (ref 3.7–4.7)
Alkaline Phosphatase: 112 IU/L (ref 44–121)
BUN/Creatinine Ratio: 12 (ref 12–28)
BUN: 10 mg/dL (ref 8–27)
Bilirubin Total: 0.5 mg/dL (ref 0.0–1.2)
CO2: 25 mmol/L (ref 20–29)
Calcium: 9.4 mg/dL (ref 8.7–10.3)
Chloride: 104 mmol/L (ref 96–106)
Creatinine, Ser: 0.81 mg/dL (ref 0.57–1.00)
Globulin, Total: 2.2 g/dL (ref 1.5–4.5)
Glucose: 187 mg/dL — ABNORMAL HIGH (ref 70–99)
Potassium: 4.7 mmol/L (ref 3.5–5.2)
Sodium: 141 mmol/L (ref 134–144)
Total Protein: 5.8 g/dL — ABNORMAL LOW (ref 6.0–8.5)
eGFR: 73 mL/min/{1.73_m2} (ref >59)

## 2022-12-21 DIAGNOSIS — Z87440 Personal history of urinary (tract) infections: Secondary | ICD-10-CM | POA: Diagnosis not present

## 2022-12-21 DIAGNOSIS — I110 Hypertensive heart disease with heart failure: Secondary | ICD-10-CM | POA: Diagnosis not present

## 2022-12-21 DIAGNOSIS — I5022 Chronic systolic (congestive) heart failure: Secondary | ICD-10-CM | POA: Diagnosis not present

## 2022-12-21 DIAGNOSIS — E46 Unspecified protein-calorie malnutrition: Secondary | ICD-10-CM | POA: Diagnosis not present

## 2022-12-21 DIAGNOSIS — Z7901 Long term (current) use of anticoagulants: Secondary | ICD-10-CM | POA: Diagnosis not present

## 2022-12-21 DIAGNOSIS — E119 Type 2 diabetes mellitus without complications: Secondary | ICD-10-CM | POA: Diagnosis not present

## 2022-12-21 DIAGNOSIS — K219 Gastro-esophageal reflux disease without esophagitis: Secondary | ICD-10-CM | POA: Diagnosis not present

## 2022-12-21 DIAGNOSIS — G250 Essential tremor: Secondary | ICD-10-CM | POA: Diagnosis not present

## 2022-12-21 DIAGNOSIS — R1310 Dysphagia, unspecified: Secondary | ICD-10-CM | POA: Diagnosis not present

## 2022-12-21 DIAGNOSIS — H6123 Impacted cerumen, bilateral: Secondary | ICD-10-CM | POA: Diagnosis not present

## 2022-12-21 DIAGNOSIS — I340 Nonrheumatic mitral (valve) insufficiency: Secondary | ICD-10-CM | POA: Diagnosis not present

## 2022-12-21 DIAGNOSIS — Z978 Presence of other specified devices: Secondary | ICD-10-CM | POA: Diagnosis not present

## 2022-12-21 DIAGNOSIS — I69398 Other sequelae of cerebral infarction: Secondary | ICD-10-CM | POA: Diagnosis not present

## 2022-12-21 DIAGNOSIS — R531 Weakness: Secondary | ICD-10-CM | POA: Diagnosis not present

## 2022-12-21 DIAGNOSIS — Z9181 History of falling: Secondary | ICD-10-CM | POA: Diagnosis not present

## 2022-12-21 DIAGNOSIS — G9340 Encephalopathy, unspecified: Secondary | ICD-10-CM | POA: Diagnosis not present

## 2022-12-21 DIAGNOSIS — Z951 Presence of aortocoronary bypass graft: Secondary | ICD-10-CM | POA: Diagnosis not present

## 2022-12-21 DIAGNOSIS — I4819 Other persistent atrial fibrillation: Secondary | ICD-10-CM | POA: Diagnosis not present

## 2022-12-21 DIAGNOSIS — M150 Primary generalized (osteo)arthritis: Secondary | ICD-10-CM | POA: Diagnosis not present

## 2022-12-21 DIAGNOSIS — Z7984 Long term (current) use of oral hypoglycemic drugs: Secondary | ICD-10-CM | POA: Diagnosis not present

## 2022-12-21 DIAGNOSIS — I959 Hypotension, unspecified: Secondary | ICD-10-CM | POA: Diagnosis not present

## 2022-12-21 DIAGNOSIS — E782 Mixed hyperlipidemia: Secondary | ICD-10-CM | POA: Diagnosis not present

## 2022-12-21 DIAGNOSIS — I2510 Atherosclerotic heart disease of native coronary artery without angina pectoris: Secondary | ICD-10-CM | POA: Diagnosis not present

## 2022-12-25 ENCOUNTER — Telehealth: Payer: Self-pay | Admitting: *Deleted

## 2022-12-25 ENCOUNTER — Ambulatory Visit (INDEPENDENT_AMBULATORY_CARE_PROVIDER_SITE_OTHER): Payer: Medicare Other

## 2022-12-25 DIAGNOSIS — R531 Weakness: Secondary | ICD-10-CM | POA: Diagnosis not present

## 2022-12-25 DIAGNOSIS — K219 Gastro-esophageal reflux disease without esophagitis: Secondary | ICD-10-CM | POA: Diagnosis not present

## 2022-12-25 DIAGNOSIS — Z87440 Personal history of urinary (tract) infections: Secondary | ICD-10-CM | POA: Diagnosis not present

## 2022-12-25 DIAGNOSIS — G9340 Encephalopathy, unspecified: Secondary | ICD-10-CM

## 2022-12-25 DIAGNOSIS — I2510 Atherosclerotic heart disease of native coronary artery without angina pectoris: Secondary | ICD-10-CM

## 2022-12-25 DIAGNOSIS — E46 Unspecified protein-calorie malnutrition: Secondary | ICD-10-CM | POA: Diagnosis not present

## 2022-12-25 DIAGNOSIS — I4819 Other persistent atrial fibrillation: Secondary | ICD-10-CM | POA: Diagnosis not present

## 2022-12-25 DIAGNOSIS — E782 Mixed hyperlipidemia: Secondary | ICD-10-CM | POA: Diagnosis not present

## 2022-12-25 DIAGNOSIS — I69398 Other sequelae of cerebral infarction: Secondary | ICD-10-CM | POA: Diagnosis not present

## 2022-12-25 DIAGNOSIS — G250 Essential tremor: Secondary | ICD-10-CM | POA: Diagnosis not present

## 2022-12-25 DIAGNOSIS — E119 Type 2 diabetes mellitus without complications: Secondary | ICD-10-CM

## 2022-12-25 DIAGNOSIS — R1310 Dysphagia, unspecified: Secondary | ICD-10-CM | POA: Diagnosis not present

## 2022-12-25 DIAGNOSIS — Z978 Presence of other specified devices: Secondary | ICD-10-CM | POA: Diagnosis not present

## 2022-12-25 DIAGNOSIS — Z7984 Long term (current) use of oral hypoglycemic drugs: Secondary | ICD-10-CM | POA: Diagnosis not present

## 2022-12-25 DIAGNOSIS — Z7901 Long term (current) use of anticoagulants: Secondary | ICD-10-CM | POA: Diagnosis not present

## 2022-12-25 DIAGNOSIS — M150 Primary generalized (osteo)arthritis: Secondary | ICD-10-CM | POA: Diagnosis not present

## 2022-12-25 DIAGNOSIS — I340 Nonrheumatic mitral (valve) insufficiency: Secondary | ICD-10-CM | POA: Diagnosis not present

## 2022-12-25 DIAGNOSIS — I10 Essential (primary) hypertension: Secondary | ICD-10-CM

## 2022-12-25 DIAGNOSIS — Z9181 History of falling: Secondary | ICD-10-CM | POA: Diagnosis not present

## 2022-12-25 DIAGNOSIS — Z951 Presence of aortocoronary bypass graft: Secondary | ICD-10-CM | POA: Diagnosis not present

## 2022-12-25 DIAGNOSIS — H6123 Impacted cerumen, bilateral: Secondary | ICD-10-CM | POA: Diagnosis not present

## 2022-12-25 DIAGNOSIS — I5022 Chronic systolic (congestive) heart failure: Secondary | ICD-10-CM | POA: Diagnosis not present

## 2022-12-25 DIAGNOSIS — I110 Hypertensive heart disease with heart failure: Secondary | ICD-10-CM | POA: Diagnosis not present

## 2022-12-25 DIAGNOSIS — I959 Hypotension, unspecified: Secondary | ICD-10-CM | POA: Diagnosis not present

## 2022-12-25 DIAGNOSIS — R600 Localized edema: Secondary | ICD-10-CM

## 2022-12-25 MED ORDER — FUROSEMIDE 20 MG PO TABS
20.0000 mg | ORAL_TABLET | Freq: Every day | ORAL | 1 refills | Status: DC
Start: 2022-12-25 — End: 2023-01-18

## 2022-12-25 NOTE — Telephone Encounter (Signed)
If edema present, start Lasix  20 mg as needed for fluid. Pt started on medication for weight gain and to increase appetite, so should only take lasix if having edema and SOB.

## 2022-12-25 NOTE — Addendum Note (Signed)
Addended by: Jannifer Rodney A on: 12/25/2022 03:30 PM   Modules accepted: Orders

## 2022-12-25 NOTE — Telephone Encounter (Signed)
TC from Tabitha w/ [facility] Checking to see if pt is suppose to still be taking Furosemide, she does not have any in the home. In looking at her chart, it is still on her med list but has not been RFd since Feb. In looking at hospital discharge from 11/12/22 [facility] discontinued it. Pt has had a 4 lb weight gain since last week, please advise and if appropriate to restart pt will need refill sent to [facility]

## 2022-12-25 NOTE — Telephone Encounter (Signed)
Patient aware and verbalized understanding. °

## 2022-12-25 NOTE — Telephone Encounter (Signed)
Home health aware.

## 2022-12-26 DIAGNOSIS — H10013 Acute follicular conjunctivitis, bilateral: Secondary | ICD-10-CM | POA: Diagnosis not present

## 2022-12-27 DIAGNOSIS — Z7901 Long term (current) use of anticoagulants: Secondary | ICD-10-CM | POA: Diagnosis not present

## 2022-12-27 DIAGNOSIS — I340 Nonrheumatic mitral (valve) insufficiency: Secondary | ICD-10-CM | POA: Diagnosis not present

## 2022-12-27 DIAGNOSIS — E782 Mixed hyperlipidemia: Secondary | ICD-10-CM | POA: Diagnosis not present

## 2022-12-27 DIAGNOSIS — Z7984 Long term (current) use of oral hypoglycemic drugs: Secondary | ICD-10-CM | POA: Diagnosis not present

## 2022-12-27 DIAGNOSIS — E46 Unspecified protein-calorie malnutrition: Secondary | ICD-10-CM | POA: Diagnosis not present

## 2022-12-27 DIAGNOSIS — Z9181 History of falling: Secondary | ICD-10-CM | POA: Diagnosis not present

## 2022-12-27 DIAGNOSIS — I4819 Other persistent atrial fibrillation: Secondary | ICD-10-CM | POA: Diagnosis not present

## 2022-12-27 DIAGNOSIS — E119 Type 2 diabetes mellitus without complications: Secondary | ICD-10-CM | POA: Diagnosis not present

## 2022-12-27 DIAGNOSIS — Z951 Presence of aortocoronary bypass graft: Secondary | ICD-10-CM | POA: Diagnosis not present

## 2022-12-27 DIAGNOSIS — I2510 Atherosclerotic heart disease of native coronary artery without angina pectoris: Secondary | ICD-10-CM | POA: Diagnosis not present

## 2022-12-27 DIAGNOSIS — K219 Gastro-esophageal reflux disease without esophagitis: Secondary | ICD-10-CM | POA: Diagnosis not present

## 2022-12-27 DIAGNOSIS — I5022 Chronic systolic (congestive) heart failure: Secondary | ICD-10-CM | POA: Diagnosis not present

## 2022-12-27 DIAGNOSIS — G250 Essential tremor: Secondary | ICD-10-CM | POA: Diagnosis not present

## 2022-12-27 DIAGNOSIS — R1310 Dysphagia, unspecified: Secondary | ICD-10-CM | POA: Diagnosis not present

## 2022-12-27 DIAGNOSIS — H6123 Impacted cerumen, bilateral: Secondary | ICD-10-CM | POA: Diagnosis not present

## 2022-12-27 DIAGNOSIS — Z978 Presence of other specified devices: Secondary | ICD-10-CM | POA: Diagnosis not present

## 2022-12-27 DIAGNOSIS — I110 Hypertensive heart disease with heart failure: Secondary | ICD-10-CM | POA: Diagnosis not present

## 2022-12-27 DIAGNOSIS — M150 Primary generalized (osteo)arthritis: Secondary | ICD-10-CM | POA: Diagnosis not present

## 2022-12-27 DIAGNOSIS — G9340 Encephalopathy, unspecified: Secondary | ICD-10-CM | POA: Diagnosis not present

## 2022-12-27 DIAGNOSIS — I959 Hypotension, unspecified: Secondary | ICD-10-CM | POA: Diagnosis not present

## 2022-12-27 DIAGNOSIS — I69398 Other sequelae of cerebral infarction: Secondary | ICD-10-CM | POA: Diagnosis not present

## 2022-12-27 DIAGNOSIS — R531 Weakness: Secondary | ICD-10-CM | POA: Diagnosis not present

## 2022-12-27 DIAGNOSIS — Z87440 Personal history of urinary (tract) infections: Secondary | ICD-10-CM | POA: Diagnosis not present

## 2022-12-28 DIAGNOSIS — R531 Weakness: Secondary | ICD-10-CM | POA: Diagnosis not present

## 2022-12-28 DIAGNOSIS — M150 Primary generalized (osteo)arthritis: Secondary | ICD-10-CM | POA: Diagnosis not present

## 2022-12-28 DIAGNOSIS — G9340 Encephalopathy, unspecified: Secondary | ICD-10-CM | POA: Diagnosis not present

## 2022-12-28 DIAGNOSIS — Z9181 History of falling: Secondary | ICD-10-CM | POA: Diagnosis not present

## 2022-12-28 DIAGNOSIS — I4819 Other persistent atrial fibrillation: Secondary | ICD-10-CM | POA: Diagnosis not present

## 2022-12-28 DIAGNOSIS — H6123 Impacted cerumen, bilateral: Secondary | ICD-10-CM | POA: Diagnosis not present

## 2022-12-28 DIAGNOSIS — R1310 Dysphagia, unspecified: Secondary | ICD-10-CM | POA: Diagnosis not present

## 2022-12-28 DIAGNOSIS — Z7901 Long term (current) use of anticoagulants: Secondary | ICD-10-CM | POA: Diagnosis not present

## 2022-12-28 DIAGNOSIS — Z951 Presence of aortocoronary bypass graft: Secondary | ICD-10-CM | POA: Diagnosis not present

## 2022-12-28 DIAGNOSIS — Z978 Presence of other specified devices: Secondary | ICD-10-CM | POA: Diagnosis not present

## 2022-12-28 DIAGNOSIS — I2510 Atherosclerotic heart disease of native coronary artery without angina pectoris: Secondary | ICD-10-CM | POA: Diagnosis not present

## 2022-12-28 DIAGNOSIS — Z7984 Long term (current) use of oral hypoglycemic drugs: Secondary | ICD-10-CM | POA: Diagnosis not present

## 2022-12-28 DIAGNOSIS — I340 Nonrheumatic mitral (valve) insufficiency: Secondary | ICD-10-CM | POA: Diagnosis not present

## 2022-12-28 DIAGNOSIS — E46 Unspecified protein-calorie malnutrition: Secondary | ICD-10-CM | POA: Diagnosis not present

## 2022-12-28 DIAGNOSIS — K219 Gastro-esophageal reflux disease without esophagitis: Secondary | ICD-10-CM | POA: Diagnosis not present

## 2022-12-28 DIAGNOSIS — E782 Mixed hyperlipidemia: Secondary | ICD-10-CM | POA: Diagnosis not present

## 2022-12-28 DIAGNOSIS — I69398 Other sequelae of cerebral infarction: Secondary | ICD-10-CM | POA: Diagnosis not present

## 2022-12-28 DIAGNOSIS — I5022 Chronic systolic (congestive) heart failure: Secondary | ICD-10-CM | POA: Diagnosis not present

## 2022-12-28 DIAGNOSIS — I959 Hypotension, unspecified: Secondary | ICD-10-CM | POA: Diagnosis not present

## 2022-12-28 DIAGNOSIS — I110 Hypertensive heart disease with heart failure: Secondary | ICD-10-CM | POA: Diagnosis not present

## 2022-12-28 DIAGNOSIS — G250 Essential tremor: Secondary | ICD-10-CM | POA: Diagnosis not present

## 2022-12-28 DIAGNOSIS — Z87440 Personal history of urinary (tract) infections: Secondary | ICD-10-CM | POA: Diagnosis not present

## 2022-12-28 DIAGNOSIS — E119 Type 2 diabetes mellitus without complications: Secondary | ICD-10-CM | POA: Diagnosis not present

## 2023-01-01 ENCOUNTER — Ambulatory Visit: Payer: Medicare Other | Admitting: Family

## 2023-01-02 DIAGNOSIS — E46 Unspecified protein-calorie malnutrition: Secondary | ICD-10-CM | POA: Diagnosis not present

## 2023-01-02 DIAGNOSIS — R531 Weakness: Secondary | ICD-10-CM | POA: Diagnosis not present

## 2023-01-02 DIAGNOSIS — R1310 Dysphagia, unspecified: Secondary | ICD-10-CM | POA: Diagnosis not present

## 2023-01-02 DIAGNOSIS — I5022 Chronic systolic (congestive) heart failure: Secondary | ICD-10-CM | POA: Diagnosis not present

## 2023-01-02 DIAGNOSIS — Z7984 Long term (current) use of oral hypoglycemic drugs: Secondary | ICD-10-CM | POA: Diagnosis not present

## 2023-01-02 DIAGNOSIS — Z951 Presence of aortocoronary bypass graft: Secondary | ICD-10-CM | POA: Diagnosis not present

## 2023-01-02 DIAGNOSIS — I4819 Other persistent atrial fibrillation: Secondary | ICD-10-CM | POA: Diagnosis not present

## 2023-01-02 DIAGNOSIS — Z87440 Personal history of urinary (tract) infections: Secondary | ICD-10-CM | POA: Diagnosis not present

## 2023-01-02 DIAGNOSIS — G9340 Encephalopathy, unspecified: Secondary | ICD-10-CM | POA: Diagnosis not present

## 2023-01-02 DIAGNOSIS — M150 Primary generalized (osteo)arthritis: Secondary | ICD-10-CM | POA: Diagnosis not present

## 2023-01-02 DIAGNOSIS — I2510 Atherosclerotic heart disease of native coronary artery without angina pectoris: Secondary | ICD-10-CM | POA: Diagnosis not present

## 2023-01-02 DIAGNOSIS — Z9181 History of falling: Secondary | ICD-10-CM | POA: Diagnosis not present

## 2023-01-02 DIAGNOSIS — I959 Hypotension, unspecified: Secondary | ICD-10-CM | POA: Diagnosis not present

## 2023-01-02 DIAGNOSIS — E782 Mixed hyperlipidemia: Secondary | ICD-10-CM | POA: Diagnosis not present

## 2023-01-02 DIAGNOSIS — Z978 Presence of other specified devices: Secondary | ICD-10-CM | POA: Diagnosis not present

## 2023-01-02 DIAGNOSIS — G250 Essential tremor: Secondary | ICD-10-CM | POA: Diagnosis not present

## 2023-01-02 DIAGNOSIS — I340 Nonrheumatic mitral (valve) insufficiency: Secondary | ICD-10-CM | POA: Diagnosis not present

## 2023-01-02 DIAGNOSIS — H6123 Impacted cerumen, bilateral: Secondary | ICD-10-CM | POA: Diagnosis not present

## 2023-01-02 DIAGNOSIS — I69398 Other sequelae of cerebral infarction: Secondary | ICD-10-CM | POA: Diagnosis not present

## 2023-01-02 DIAGNOSIS — I110 Hypertensive heart disease with heart failure: Secondary | ICD-10-CM | POA: Diagnosis not present

## 2023-01-02 DIAGNOSIS — Z7901 Long term (current) use of anticoagulants: Secondary | ICD-10-CM | POA: Diagnosis not present

## 2023-01-02 DIAGNOSIS — K219 Gastro-esophageal reflux disease without esophagitis: Secondary | ICD-10-CM | POA: Diagnosis not present

## 2023-01-02 DIAGNOSIS — E119 Type 2 diabetes mellitus without complications: Secondary | ICD-10-CM | POA: Diagnosis not present

## 2023-01-04 ENCOUNTER — Telehealth: Payer: Self-pay | Admitting: Family

## 2023-01-04 DIAGNOSIS — G9340 Encephalopathy, unspecified: Secondary | ICD-10-CM | POA: Diagnosis not present

## 2023-01-04 DIAGNOSIS — Z9181 History of falling: Secondary | ICD-10-CM | POA: Diagnosis not present

## 2023-01-04 DIAGNOSIS — Z7984 Long term (current) use of oral hypoglycemic drugs: Secondary | ICD-10-CM | POA: Diagnosis not present

## 2023-01-04 DIAGNOSIS — I2510 Atherosclerotic heart disease of native coronary artery without angina pectoris: Secondary | ICD-10-CM | POA: Diagnosis not present

## 2023-01-04 DIAGNOSIS — R531 Weakness: Secondary | ICD-10-CM | POA: Diagnosis not present

## 2023-01-04 DIAGNOSIS — I5022 Chronic systolic (congestive) heart failure: Secondary | ICD-10-CM | POA: Diagnosis not present

## 2023-01-04 DIAGNOSIS — Z87440 Personal history of urinary (tract) infections: Secondary | ICD-10-CM | POA: Diagnosis not present

## 2023-01-04 DIAGNOSIS — G250 Essential tremor: Secondary | ICD-10-CM | POA: Diagnosis not present

## 2023-01-04 DIAGNOSIS — Z978 Presence of other specified devices: Secondary | ICD-10-CM | POA: Diagnosis not present

## 2023-01-04 DIAGNOSIS — E46 Unspecified protein-calorie malnutrition: Secondary | ICD-10-CM | POA: Diagnosis not present

## 2023-01-04 DIAGNOSIS — H6123 Impacted cerumen, bilateral: Secondary | ICD-10-CM | POA: Diagnosis not present

## 2023-01-04 DIAGNOSIS — E119 Type 2 diabetes mellitus without complications: Secondary | ICD-10-CM | POA: Diagnosis not present

## 2023-01-04 DIAGNOSIS — R1310 Dysphagia, unspecified: Secondary | ICD-10-CM | POA: Diagnosis not present

## 2023-01-04 DIAGNOSIS — Z7901 Long term (current) use of anticoagulants: Secondary | ICD-10-CM | POA: Diagnosis not present

## 2023-01-04 DIAGNOSIS — Z951 Presence of aortocoronary bypass graft: Secondary | ICD-10-CM | POA: Diagnosis not present

## 2023-01-04 DIAGNOSIS — I959 Hypotension, unspecified: Secondary | ICD-10-CM | POA: Diagnosis not present

## 2023-01-04 DIAGNOSIS — I340 Nonrheumatic mitral (valve) insufficiency: Secondary | ICD-10-CM | POA: Diagnosis not present

## 2023-01-04 DIAGNOSIS — I69398 Other sequelae of cerebral infarction: Secondary | ICD-10-CM | POA: Diagnosis not present

## 2023-01-04 DIAGNOSIS — K219 Gastro-esophageal reflux disease without esophagitis: Secondary | ICD-10-CM | POA: Diagnosis not present

## 2023-01-04 DIAGNOSIS — I110 Hypertensive heart disease with heart failure: Secondary | ICD-10-CM | POA: Diagnosis not present

## 2023-01-04 DIAGNOSIS — M150 Primary generalized (osteo)arthritis: Secondary | ICD-10-CM | POA: Diagnosis not present

## 2023-01-04 DIAGNOSIS — I4819 Other persistent atrial fibrillation: Secondary | ICD-10-CM | POA: Diagnosis not present

## 2023-01-04 DIAGNOSIS — E782 Mixed hyperlipidemia: Secondary | ICD-10-CM | POA: Diagnosis not present

## 2023-01-04 NOTE — Telephone Encounter (Signed)
Wants to continue skilled nursing for 1 week for 4 more weeks due to weight gain. So far it has been 10 pounds in two weeks.

## 2023-01-07 NOTE — Telephone Encounter (Signed)
Patient aware and verbalized understanding. 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

## 2023-01-07 NOTE — Telephone Encounter (Signed)
Ok to continue skin nursing. Is patient taking lasix daily?

## 2023-01-08 DIAGNOSIS — H6123 Impacted cerumen, bilateral: Secondary | ICD-10-CM | POA: Diagnosis not present

## 2023-01-08 DIAGNOSIS — Z7984 Long term (current) use of oral hypoglycemic drugs: Secondary | ICD-10-CM | POA: Diagnosis not present

## 2023-01-08 DIAGNOSIS — R531 Weakness: Secondary | ICD-10-CM | POA: Diagnosis not present

## 2023-01-08 DIAGNOSIS — I959 Hypotension, unspecified: Secondary | ICD-10-CM | POA: Diagnosis not present

## 2023-01-08 DIAGNOSIS — G250 Essential tremor: Secondary | ICD-10-CM | POA: Diagnosis not present

## 2023-01-08 DIAGNOSIS — I4819 Other persistent atrial fibrillation: Secondary | ICD-10-CM | POA: Diagnosis not present

## 2023-01-08 DIAGNOSIS — R1310 Dysphagia, unspecified: Secondary | ICD-10-CM | POA: Diagnosis not present

## 2023-01-08 DIAGNOSIS — I340 Nonrheumatic mitral (valve) insufficiency: Secondary | ICD-10-CM | POA: Diagnosis not present

## 2023-01-08 DIAGNOSIS — E46 Unspecified protein-calorie malnutrition: Secondary | ICD-10-CM | POA: Diagnosis not present

## 2023-01-08 DIAGNOSIS — Z9181 History of falling: Secondary | ICD-10-CM | POA: Diagnosis not present

## 2023-01-08 DIAGNOSIS — I2510 Atherosclerotic heart disease of native coronary artery without angina pectoris: Secondary | ICD-10-CM | POA: Diagnosis not present

## 2023-01-08 DIAGNOSIS — Z87440 Personal history of urinary (tract) infections: Secondary | ICD-10-CM | POA: Diagnosis not present

## 2023-01-08 DIAGNOSIS — M150 Primary generalized (osteo)arthritis: Secondary | ICD-10-CM | POA: Diagnosis not present

## 2023-01-08 DIAGNOSIS — K219 Gastro-esophageal reflux disease without esophagitis: Secondary | ICD-10-CM | POA: Diagnosis not present

## 2023-01-08 DIAGNOSIS — Z7901 Long term (current) use of anticoagulants: Secondary | ICD-10-CM | POA: Diagnosis not present

## 2023-01-08 DIAGNOSIS — I69398 Other sequelae of cerebral infarction: Secondary | ICD-10-CM | POA: Diagnosis not present

## 2023-01-08 DIAGNOSIS — I5022 Chronic systolic (congestive) heart failure: Secondary | ICD-10-CM | POA: Diagnosis not present

## 2023-01-08 DIAGNOSIS — I110 Hypertensive heart disease with heart failure: Secondary | ICD-10-CM | POA: Diagnosis not present

## 2023-01-08 DIAGNOSIS — E119 Type 2 diabetes mellitus without complications: Secondary | ICD-10-CM | POA: Diagnosis not present

## 2023-01-08 DIAGNOSIS — Z978 Presence of other specified devices: Secondary | ICD-10-CM | POA: Diagnosis not present

## 2023-01-08 DIAGNOSIS — E782 Mixed hyperlipidemia: Secondary | ICD-10-CM | POA: Diagnosis not present

## 2023-01-08 DIAGNOSIS — Z951 Presence of aortocoronary bypass graft: Secondary | ICD-10-CM | POA: Diagnosis not present

## 2023-01-08 DIAGNOSIS — G9340 Encephalopathy, unspecified: Secondary | ICD-10-CM | POA: Diagnosis not present

## 2023-01-09 DIAGNOSIS — I5022 Chronic systolic (congestive) heart failure: Secondary | ICD-10-CM | POA: Diagnosis not present

## 2023-01-09 DIAGNOSIS — E46 Unspecified protein-calorie malnutrition: Secondary | ICD-10-CM | POA: Diagnosis not present

## 2023-01-09 DIAGNOSIS — I959 Hypotension, unspecified: Secondary | ICD-10-CM | POA: Diagnosis not present

## 2023-01-09 DIAGNOSIS — H6123 Impacted cerumen, bilateral: Secondary | ICD-10-CM | POA: Diagnosis not present

## 2023-01-09 DIAGNOSIS — I2510 Atherosclerotic heart disease of native coronary artery without angina pectoris: Secondary | ICD-10-CM | POA: Diagnosis not present

## 2023-01-09 DIAGNOSIS — I340 Nonrheumatic mitral (valve) insufficiency: Secondary | ICD-10-CM | POA: Diagnosis not present

## 2023-01-09 DIAGNOSIS — I4819 Other persistent atrial fibrillation: Secondary | ICD-10-CM | POA: Diagnosis not present

## 2023-01-09 DIAGNOSIS — Z978 Presence of other specified devices: Secondary | ICD-10-CM | POA: Diagnosis not present

## 2023-01-09 DIAGNOSIS — I69398 Other sequelae of cerebral infarction: Secondary | ICD-10-CM | POA: Diagnosis not present

## 2023-01-09 DIAGNOSIS — K219 Gastro-esophageal reflux disease without esophagitis: Secondary | ICD-10-CM | POA: Diagnosis not present

## 2023-01-09 DIAGNOSIS — I110 Hypertensive heart disease with heart failure: Secondary | ICD-10-CM | POA: Diagnosis not present

## 2023-01-09 DIAGNOSIS — Z9181 History of falling: Secondary | ICD-10-CM | POA: Diagnosis not present

## 2023-01-09 DIAGNOSIS — R531 Weakness: Secondary | ICD-10-CM | POA: Diagnosis not present

## 2023-01-09 DIAGNOSIS — Z7984 Long term (current) use of oral hypoglycemic drugs: Secondary | ICD-10-CM | POA: Diagnosis not present

## 2023-01-09 DIAGNOSIS — Z951 Presence of aortocoronary bypass graft: Secondary | ICD-10-CM | POA: Diagnosis not present

## 2023-01-09 DIAGNOSIS — Z7901 Long term (current) use of anticoagulants: Secondary | ICD-10-CM | POA: Diagnosis not present

## 2023-01-09 DIAGNOSIS — G250 Essential tremor: Secondary | ICD-10-CM | POA: Diagnosis not present

## 2023-01-09 DIAGNOSIS — G9340 Encephalopathy, unspecified: Secondary | ICD-10-CM | POA: Diagnosis not present

## 2023-01-09 DIAGNOSIS — E119 Type 2 diabetes mellitus without complications: Secondary | ICD-10-CM | POA: Diagnosis not present

## 2023-01-09 DIAGNOSIS — E782 Mixed hyperlipidemia: Secondary | ICD-10-CM | POA: Diagnosis not present

## 2023-01-09 DIAGNOSIS — R1310 Dysphagia, unspecified: Secondary | ICD-10-CM | POA: Diagnosis not present

## 2023-01-09 DIAGNOSIS — M150 Primary generalized (osteo)arthritis: Secondary | ICD-10-CM | POA: Diagnosis not present

## 2023-01-09 DIAGNOSIS — Z87440 Personal history of urinary (tract) infections: Secondary | ICD-10-CM | POA: Diagnosis not present

## 2023-01-14 DIAGNOSIS — I340 Nonrheumatic mitral (valve) insufficiency: Secondary | ICD-10-CM | POA: Diagnosis not present

## 2023-01-14 DIAGNOSIS — G9340 Encephalopathy, unspecified: Secondary | ICD-10-CM | POA: Diagnosis not present

## 2023-01-14 DIAGNOSIS — E46 Unspecified protein-calorie malnutrition: Secondary | ICD-10-CM | POA: Diagnosis not present

## 2023-01-14 DIAGNOSIS — H6123 Impacted cerumen, bilateral: Secondary | ICD-10-CM | POA: Diagnosis not present

## 2023-01-14 DIAGNOSIS — Z951 Presence of aortocoronary bypass graft: Secondary | ICD-10-CM | POA: Diagnosis not present

## 2023-01-14 DIAGNOSIS — I959 Hypotension, unspecified: Secondary | ICD-10-CM | POA: Diagnosis not present

## 2023-01-14 DIAGNOSIS — I2510 Atherosclerotic heart disease of native coronary artery without angina pectoris: Secondary | ICD-10-CM | POA: Diagnosis not present

## 2023-01-14 DIAGNOSIS — E782 Mixed hyperlipidemia: Secondary | ICD-10-CM | POA: Diagnosis not present

## 2023-01-14 DIAGNOSIS — K219 Gastro-esophageal reflux disease without esophagitis: Secondary | ICD-10-CM | POA: Diagnosis not present

## 2023-01-14 DIAGNOSIS — R1310 Dysphagia, unspecified: Secondary | ICD-10-CM | POA: Diagnosis not present

## 2023-01-14 DIAGNOSIS — I5022 Chronic systolic (congestive) heart failure: Secondary | ICD-10-CM | POA: Diagnosis not present

## 2023-01-14 DIAGNOSIS — M150 Primary generalized (osteo)arthritis: Secondary | ICD-10-CM | POA: Diagnosis not present

## 2023-01-14 DIAGNOSIS — R531 Weakness: Secondary | ICD-10-CM | POA: Diagnosis not present

## 2023-01-14 DIAGNOSIS — G250 Essential tremor: Secondary | ICD-10-CM | POA: Diagnosis not present

## 2023-01-14 DIAGNOSIS — Z87440 Personal history of urinary (tract) infections: Secondary | ICD-10-CM | POA: Diagnosis not present

## 2023-01-14 DIAGNOSIS — I69398 Other sequelae of cerebral infarction: Secondary | ICD-10-CM | POA: Diagnosis not present

## 2023-01-14 DIAGNOSIS — I4819 Other persistent atrial fibrillation: Secondary | ICD-10-CM | POA: Diagnosis not present

## 2023-01-14 DIAGNOSIS — I110 Hypertensive heart disease with heart failure: Secondary | ICD-10-CM | POA: Diagnosis not present

## 2023-01-14 DIAGNOSIS — Z7901 Long term (current) use of anticoagulants: Secondary | ICD-10-CM | POA: Diagnosis not present

## 2023-01-14 DIAGNOSIS — Z7984 Long term (current) use of oral hypoglycemic drugs: Secondary | ICD-10-CM | POA: Diagnosis not present

## 2023-01-14 DIAGNOSIS — Z978 Presence of other specified devices: Secondary | ICD-10-CM | POA: Diagnosis not present

## 2023-01-14 DIAGNOSIS — E119 Type 2 diabetes mellitus without complications: Secondary | ICD-10-CM | POA: Diagnosis not present

## 2023-01-14 DIAGNOSIS — Z9181 History of falling: Secondary | ICD-10-CM | POA: Diagnosis not present

## 2023-01-18 ENCOUNTER — Encounter: Payer: Self-pay | Admitting: Family

## 2023-01-18 ENCOUNTER — Ambulatory Visit (INDEPENDENT_AMBULATORY_CARE_PROVIDER_SITE_OTHER): Payer: Medicare Other | Admitting: Family

## 2023-01-18 VITALS — BP 141/89 | HR 67 | Temp 97.2°F | Ht 64.0 in | Wt 122.2 lb

## 2023-01-18 DIAGNOSIS — E46 Unspecified protein-calorie malnutrition: Secondary | ICD-10-CM | POA: Diagnosis not present

## 2023-01-18 DIAGNOSIS — R600 Localized edema: Secondary | ICD-10-CM | POA: Diagnosis not present

## 2023-01-18 DIAGNOSIS — E1169 Type 2 diabetes mellitus with other specified complication: Secondary | ICD-10-CM | POA: Diagnosis not present

## 2023-01-18 DIAGNOSIS — I4891 Unspecified atrial fibrillation: Secondary | ICD-10-CM

## 2023-01-18 DIAGNOSIS — I10 Essential (primary) hypertension: Secondary | ICD-10-CM | POA: Diagnosis not present

## 2023-01-18 DIAGNOSIS — I110 Hypertensive heart disease with heart failure: Secondary | ICD-10-CM

## 2023-01-18 DIAGNOSIS — I509 Heart failure, unspecified: Secondary | ICD-10-CM | POA: Insufficient documentation

## 2023-01-18 MED ORDER — FUROSEMIDE 20 MG PO TABS: 20.0000 mg | ORAL_TABLET | Freq: Every day | ORAL | 1 refills | Status: DC

## 2023-01-18 NOTE — Progress Notes (Signed)
Subjective:    Patient ID: Virginia [name], female    DOB: 28-Jan-1941, 82 y.o.   MRN: [id number]  Chief Complaint  Patient presents with   Follow-up   PT presents to the office today for chronic follow up and follow up on weight loss.    She was seen on 12/04/22 and we stopped her lasix related to hypotension. We started her on Remeron 7.5 mg at that visit because she had lost 47 lbs since 08/10/22. She gained 14l bs back.  Denies any SOB, chest pain, or edema. She is drinking Ensure daily.      01/18/2023   11:18 AM 12/18/2022    3:20 PM 12/04/2022    8:20 AM  Last 3 Weights  Weight (lbs) 122 lb 3.2 oz 118 lb 108 lb  Weight (kg) 55.43 kg 53.524 kg 48.988 kg     Pt lives at home with her Down Syndrome daughter. PT is working once a week with patient.    She had seen GI for GI Bleed and was cleared. Continue to avoid NSAID's.    She is followed by Cardiologists annually for A Fib, CAD, and HTN. Continues to take Eliquis. These are stable.   Hypertension This is a chronic problem. The current episode started more than 1 year ago. The problem has been waxing and waning since onset. The problem is uncontrolled. Associated symptoms include peripheral edema (traace). Pertinent negatives include no blurred vision, malaise/fatigue or shortness of breath. Risk factors for coronary artery disease include dyslipidemia.  Diabetes She presents for her follow-up diabetic visit. She has type 2 diabetes mellitus. Pertinent negatives for diabetes include no blurred vision and no foot paresthesias. Risk factors for coronary artery disease include diabetes mellitus, dyslipidemia, hypertension and sedentary lifestyle.      Review of Systems  Constitutional:  Negative for malaise/fatigue.  Eyes:  Negative for blurred vision.  Respiratory:  Negative for shortness of breath.   All other systems reviewed and are negative.      Objective:   Physical Exam Vitals reviewed.  Constitutional:       General: She is not in acute distress.    Appearance: She is well-developed.  HENT:     Head: Normocephalic and atraumatic.     Right Ear: Tympanic membrane normal.     Left Ear: Tympanic membrane normal.  Eyes:     Pupils: Pupils are equal, round, and reactive to light.  Neck:     Thyroid: No thyromegaly.  Cardiovascular:     Rate and Rhythm: Normal rate and regular rhythm.     Heart sounds: Normal heart sounds. No murmur heard. Pulmonary:     Effort: Pulmonary effort is normal. No respiratory distress.     Breath sounds: Normal breath sounds. No wheezing.  Abdominal:     General: Bowel sounds are normal. There is no distension.     Palpations: Abdomen is soft.     Tenderness: There is no abdominal tenderness.  Musculoskeletal:        General: No tenderness. Normal range of motion.     Cervical back: Normal range of motion and neck supple.     Right lower leg: Edema (trace) present.     Left lower leg: Edema (trace) present.  Skin:    General: Skin is warm and dry.  Neurological:     Mental Status: She is alert and oriented to person, place, and time.     Cranial Nerves: No cranial nerve deficit.  Deep Tendon Reflexes: Reflexes are normal and symmetric.  Psychiatric:        Behavior: Behavior normal.        Thought Content: Thought content normal.        Judgment: Judgment normal.       BP (!) 141/89   Pulse 67   Temp (!) 97.2 F (36.2 C) (Temporal)   Ht 5\' 4"  (1.626 m)   Wt 122 lb 3.2 oz (55.4 kg)   SpO2 94%   BMI 20.98 kg/m      Assessment & Plan:  GERLINE RATTO comes in today with chief complaint of Follow-up   Diagnosis and orders addressed:  1. Type 2 diabetes mellitus with other specified complication, without long-term current use of insulin (HCC) - CMP14+EGFR  2. Protein malnutrition (HCC) - CMP14+EGFR  3. Acute on chronic congestive heart failure, unspecified heart failure type (HCC) - furosemide (LASIX) 20 MG tablet; Take 1 tablet (20 mg  total) by mouth daily.  Dispense: 90 tablet; Refill: 1 - CMP14+EGFR  4. Primary hypertension - furosemide (LASIX) 20 MG tablet; Take 1 tablet (20 mg total) by mouth daily.  Dispense: 90 tablet; Refill: 1 - CMP14+EGFR  5. Peripheral edema - furosemide (LASIX) 20 MG tablet; Take 1 tablet (20 mg total) by mouth daily.  Dispense: 90 tablet; Refill: 1 - CMP14+EGFR   Labs pending Start Lasix 20 mg Continue Remeron 7.5 mg  Continue Ensure daily Low salt diet  Health Maintenance reviewed Diet and exercise encouraged  Follow up plan: 1 month  Jannifer Rodney, FNP

## 2023-01-18 NOTE — Patient Instructions (Signed)
Heart Failure, Diagnosis  Heart failure is a condition in which the heart has trouble pumping blood. This may mean that the heart cannot pump enough blood out to the body or that the heart does not fill up with enough blood. For some people with heart failure, fluid may back up into the lungs. There may also be swelling (edema) in the lower legs. Heart failure is usually a long-term (chronic) condition. It is important for you to take good care of yourself and follow the treatment plan from your health care provider. Different stages of heart failure have different treatment plans. The stages are: Stage A: At risk for heart failure. Having no symptoms of heart failure, but being at risk for developing heart failure. Stage B: Pre-heart failure. Having no symptoms of heart failure, but having structural changes to the heart that indicate heart failure. Stage C: Symptomatic heart failure. Having symptoms of heart failure in addition to structural changes to the heart that indicate heart failure. Stage D: Advanced heart failure. Having symptoms that interfere with daily life and frequent hospitalizations related to heart failure. What are the causes? This condition may be caused by: High blood pressure (hypertension). Hypertension causes the heart muscle to work harder than normal. Coronary artery disease, or CAD. CAD is the buildup of cholesterol and fat (plaque) in the arteries of the heart. Heart attack, also called myocardial infarction. This injures the heart muscle, making it hard for the heart to pump blood. Abnormal heart valves. The valves do not open and close properly, forcing the heart to pump harder to keep the blood flowing. Heart muscle disease, inflammation, or infection (cardiomyopathy or myocarditis). This is damage to the heart muscle. It can increase the risk of heart failure. Lung disease. The heart works harder when the lungs are not healthy. What increases the risk? The risk  of heart failure increases as a person ages. This condition is also more likely to develop in people who: Are obese. Use tobacco or nicotine products. Abuse alcohol or drugs. Have taken medicines that can damage the heart, such as chemotherapy drugs. Have any of these conditions: Diabetes. Abnormal heart rhythms. Thyroid problems. Low blood counts (anemia). Chronic kidney disease. Have a family history of heart failure. What are the signs or symptoms? Symptoms of this condition include: Shortness of breath with activity, such as when climbing stairs. A cough that does not go away. Swelling of the feet, ankles, legs, or abdomen. Losing or gaining weight for no reason. Trouble breathing when lying flat. Waking from sleep because of the need to sit up and get more air. Rapid heartbeat. Other symptoms may include: Tiredness (fatigue) and loss of energy. Feeling light-headed, dizzy, or close to fainting. Nausea or loss of appetite. Waking up more often during the night to urinate (nocturia). Confusion. How is this diagnosed? This condition is diagnosed based on: Your medical history, symptoms, and a physical exam. Blood tests. Diagnostic tests, which may include: Echocardiogram. Electrocardiogram (ECG). Chest X-ray. Exercise stress test. Cardiac MRI. Cardiac catheterization and angiogram. Radionuclide scans. How is this treated? Treatment for this condition is aimed at managing the symptoms of heart failure. Medicines Treatment may include medicines that: Help lower blood pressure by relaxing (dilating) the blood vessels. These medicines are called ACE inhibitors (angiotensin-converting enzyme), ARBs (angiotensin receptor blockers), or vasodilators. Cause the kidneys to remove salt and water from the blood through urination (diuretics). Improve heart muscle strength and prevent the heart from beating too fast (beta blockers). Increase the   force of the heartbeat  (digoxin). Lower heart rates. Certain diabetes medicines (SGLT-2 inhibitors) may also be used in treatment. Healthy behavior changes Treatment may also include making healthy lifestyle changes, such as: Reaching and staying at a healthy weight. Not using tobacco or nicotine products. Eating heart-healthy foods. Limiting or avoiding alcohol. Stopping the use of illegal drugs. Being physically active. Participating in a cardiac rehabilitation program, which is a treatment program to improve your health and well-being through exercise training, education, and counseling. Other treatments Other treatments may include: Procedures to open blocked arteries or repair damaged valves. Placing a pacemaker to improve heart function (cardiac resynchronization therapy). Placing a device to treat serious abnormal heart rhythms (implantable cardioverter defibrillator, or ICD). Placing a device to improve the pumping ability of the heart (left ventricular assist device, or LVAD). Receiving a healthy heart from a donor (heart transplant). This is done when other treatments have not helped. Follow these instructions at home: Manage other health conditions as told by your health care provider. These may include hypertension, diabetes, thyroid disease, or abnormal heart rhythms. Get ongoing education and support as needed. Learn as much as you can about heart failure. Keep all follow-up visits. This is important. Where to find more information American Heart Association: www.heart.org Centers for Disease Control and Prevention: www.cdc.gov NIH National Institute on Aging: www.nia.nih.gov Summary Heart failure is a condition in which the heart has trouble pumping blood. This condition is commonly caused by high blood pressure and other diseases of the heart and lungs. Symptoms of this condition include shortness of breath, tiredness (fatigue), nausea, and swelling of the feet, ankles, legs, or  abdomen. Treatments for this condition may include medicines, lifestyle changes, and surgery. Manage other health conditions as told by your health care provider. This information is not intended to replace advice given to you by your health care provider. Make sure you discuss any questions you have with your health care provider. Document Revised: 09/12/2021 Document Reviewed: 12/26/2019 Elsevier Patient Education  2024 Elsevier Inc.  

## 2023-01-22 DIAGNOSIS — I69398 Other sequelae of cerebral infarction: Secondary | ICD-10-CM | POA: Diagnosis not present

## 2023-01-22 DIAGNOSIS — Z7901 Long term (current) use of anticoagulants: Secondary | ICD-10-CM | POA: Diagnosis not present

## 2023-01-22 DIAGNOSIS — I5022 Chronic systolic (congestive) heart failure: Secondary | ICD-10-CM | POA: Diagnosis not present

## 2023-01-22 DIAGNOSIS — Z951 Presence of aortocoronary bypass graft: Secondary | ICD-10-CM | POA: Diagnosis not present

## 2023-01-22 DIAGNOSIS — I4819 Other persistent atrial fibrillation: Secondary | ICD-10-CM | POA: Diagnosis not present

## 2023-01-22 DIAGNOSIS — Z978 Presence of other specified devices: Secondary | ICD-10-CM | POA: Diagnosis not present

## 2023-01-22 DIAGNOSIS — E119 Type 2 diabetes mellitus without complications: Secondary | ICD-10-CM | POA: Diagnosis not present

## 2023-01-22 DIAGNOSIS — I110 Hypertensive heart disease with heart failure: Secondary | ICD-10-CM | POA: Diagnosis not present

## 2023-01-22 DIAGNOSIS — K219 Gastro-esophageal reflux disease without esophagitis: Secondary | ICD-10-CM | POA: Diagnosis not present

## 2023-01-22 DIAGNOSIS — G250 Essential tremor: Secondary | ICD-10-CM | POA: Diagnosis not present

## 2023-01-22 DIAGNOSIS — I340 Nonrheumatic mitral (valve) insufficiency: Secondary | ICD-10-CM | POA: Diagnosis not present

## 2023-01-22 DIAGNOSIS — R531 Weakness: Secondary | ICD-10-CM | POA: Diagnosis not present

## 2023-01-22 DIAGNOSIS — I2510 Atherosclerotic heart disease of native coronary artery without angina pectoris: Secondary | ICD-10-CM | POA: Diagnosis not present

## 2023-01-22 DIAGNOSIS — Z87440 Personal history of urinary (tract) infections: Secondary | ICD-10-CM | POA: Diagnosis not present

## 2023-01-22 DIAGNOSIS — E782 Mixed hyperlipidemia: Secondary | ICD-10-CM | POA: Diagnosis not present

## 2023-01-22 DIAGNOSIS — I959 Hypotension, unspecified: Secondary | ICD-10-CM | POA: Diagnosis not present

## 2023-01-22 DIAGNOSIS — Z9181 History of falling: Secondary | ICD-10-CM | POA: Diagnosis not present

## 2023-01-22 DIAGNOSIS — H6123 Impacted cerumen, bilateral: Secondary | ICD-10-CM | POA: Diagnosis not present

## 2023-01-22 DIAGNOSIS — E46 Unspecified protein-calorie malnutrition: Secondary | ICD-10-CM | POA: Diagnosis not present

## 2023-01-22 DIAGNOSIS — G9340 Encephalopathy, unspecified: Secondary | ICD-10-CM | POA: Diagnosis not present

## 2023-01-22 DIAGNOSIS — Z7984 Long term (current) use of oral hypoglycemic drugs: Secondary | ICD-10-CM | POA: Diagnosis not present

## 2023-01-22 DIAGNOSIS — R1310 Dysphagia, unspecified: Secondary | ICD-10-CM | POA: Diagnosis not present

## 2023-01-22 DIAGNOSIS — M150 Primary generalized (osteo)arthritis: Secondary | ICD-10-CM | POA: Diagnosis not present

## 2023-01-23 ENCOUNTER — Ambulatory Visit: Payer: Medicare Other

## 2023-01-23 VITALS — Ht 65.0 in | Wt 122.0 lb

## 2023-01-23 DIAGNOSIS — Z780 Asymptomatic menopausal state: Secondary | ICD-10-CM | POA: Diagnosis not present

## 2023-01-23 DIAGNOSIS — Z0000 Encounter for general adult medical examination without abnormal findings: Secondary | ICD-10-CM

## 2023-01-23 NOTE — Progress Notes (Signed)
Subjective:   Virginia [name] is a 82 y.o. female who presents for Medicare Annual (Subsequent) preventive examination.  Visit Complete: Virtual  I connected with  Criss Rosales on 01/23/23 by a audio enabled telemedicine application and verified that I am speaking with the correct person using two identifiers.  Patient Location: Home  Provider Location: Home Office  I discussed the limitations of evaluation and management by telemedicine. The patient expressed understanding and agreed to proceed.  Patient Medicare AWV questionnaire was completed by the patient on 01/23/2023; I have confirmed that all information answered by patient is correct and no changes since this date.  Review of Systems    Vital Signs: Unable to obtain new vitals due to this being a telehealth visit.  Cardiac Risk Factors include: advanced age (>76men, >23 women);dyslipidemia;hypertension;diabetes mellitus Nutrition Risk Assessment:  Has the patient had any N/V/D within the last 2 months?  No  Does the patient have any non-healing wounds?  No  Has the patient had any unintentional weight loss or weight gain?  No   Diabetes:  Is the patient diabetic?  Yes  If diabetic, was a CBG obtained today?  No  Did the patient bring in their glucometer from home?  No  How often do you monitor your CBG's? Daily .   Financial Strains and Diabetes Management:  Are you having any financial strains with the device, your supplies or your medication? No .  Does the patient want to be seen by Chronic Care Management for management of their diabetes?  No  Would the patient like to be referred to a Nutritionist or for Diabetic Management?  No   Diabetic Exams:  Diabetic Eye Exam: Completed 07/2022 Diabetic Foot Exam: Overdue, Pt has been advised about the importance in completing this exam. Pt is scheduled for diabetic foot exam on next office visit .     Objective:    Today's Vitals   01/23/23 1000  Weight: 122 lb  (55.3 kg)  Height: 5\' 5"  (1.651 m)   Body mass index is 20.3 kg/m.     01/23/2023   10:03 AM 01/03/2022    3:44 PM 10/17/2021    7:16 AM 10/13/2021   10:12 AM 08/19/2021    9:34 AM 08/19/2021   12:35 AM 08/18/2021    3:46 PM  Advanced Directives  Does Patient Have a Medical Advance Directive? Yes Yes No No No No No  Type of Estate agent of [place];Living will Healthcare Power of [place];Living will       Does patient want to make changes to medical advance directive?  No - Patient declined       Copy of Healthcare Power of Attorney in Chart? No - copy requested No - copy requested       Would patient like information on creating a medical advance directive?   No - Patient declined No - Patient declined  No - Patient declined No - Patient declined    Current Medications (verified) Outpatient Encounter Medications as of 01/23/2023  Medication Sig   acetaminophen (TYLENOL) 500 MG tablet Take 500-1,000 mg by mouth every 6 (six) hours as needed (for pain.).   apixaban (ELIQUIS) 5 MG TABS tablet Take 1 tablet (5 mg total) by mouth 2 (two) times daily.   atorvastatin (LIPITOR) 80 MG tablet TAKE 1 TABLET BY MOUTH DAILY   cetirizine (ZYRTEC ALLERGY) 10 MG tablet Take 1 tablet (10 mg total) by mouth daily.   dapagliflozin propanediol (  FARXIGA) 10 MG TABS tablet Take 1 tablet (10 mg total) by mouth daily before breakfast.   feeding supplement (ENSURE ENLIVE / ENSURE PLUS) LIQD Take 237 mLs by mouth 3 (three) times daily between meals.   ferrous sulfate 325 (65 FE) MG tablet Take 650 mg by mouth at bedtime.   furosemide (LASIX) 20 MG tablet Take 1 tablet (20 mg total) by mouth daily.   metoprolol succinate (TOPROL-XL) 50 MG 24 hr tablet TAKE 1 AND 1/2 TABLETS BY MOUTH  DAILY TAKE WITH OR IMMEDIATELY  FOLLOWING A MEAL   mirtazapine (REMERON) 7.5 MG tablet Take 1 tablet (7.5 mg total) by mouth at bedtime.   nystatin cream (MYCOSTATIN) Apply 1 Application topically 2 (two) times daily.    pantoprazole (PROTONIX) 40 MG tablet Take 1 tablet (40 mg total) by mouth daily.   PARoxetine (PAXIL) 10 MG tablet Take 1 tablet (10 mg total) by mouth every morning.   No facility-administered encounter medications on file as of 01/23/2023.    Allergies (verified) Patient has no known allergies.   History: Past Medical History:  Diagnosis Date   Coronary artery disease    status post coronary artery bypass grafting x3 in 1999   GERD (gastroesophageal reflux disease)    Hyperlipidemia    Hypertension    Renal artery stenosis (HCC)    S/P CABG (coronary artery bypass graft) 06/18/1997   x3   Type 2 diabetes mellitus (HCC)    Past Surgical History:  Procedure Laterality Date   BIOPSY  08/19/2021   Procedure: BIOPSY;  Surgeon: Marguerita Merles, Reuel Boom, MD;  Location: [facility];  Service: Gastroenterology;;   CARDIAC CATHETERIZATION  04/17/2007   L main 50-60% ostital stenosis, patent LAD, 90% stenosis at L Cfx, dominant RCA with 60-70% segmental stenosis, patent LIMA-LAD, patent free LIMA-OM, patent VG-PDA (Dr. Erlene Quan)   CARDIAC CATHETERIZATION  03/02/2003   L main with 50% osital stenosis, LAD totally occluded after 2nd diagonal, L Cfx with 90% ostial OM, RCA with 70-80% segmental stenosis, patent LIMA-LAD, patent free RIMA-Cfx marginal, patent VG to PDA (Dr. Erlene Quan)   CARDIAC CATHETERIZATION  05/18/1998   significant ostial Cfx & OM & mid LAD disease - subsequent CABG (Dr. Erlene Quan)   CAROTID DOPPLER  03/2006   right bulb with 0-49% diameter reduction   CORONARY ARTERY BYPASS GRAFT  05/19/1998   LIMA to LAD, free RIMA to OM, VG to distal RCA   ESOPHAGOGASTRODUODENOSCOPY (EGD) WITH PROPOFOL N/A 08/19/2021   Procedure: ESOPHAGOGASTRODUODENOSCOPY (EGD) WITH PROPOFOL;  Surgeon: Dolores Frame, MD;  Location: [facility];  Service: Gastroenterology;  Laterality: N/A;   ESOPHAGOGASTRODUODENOSCOPY (EGD) WITH PROPOFOL N/A 10/17/2021   Procedure: ESOPHAGOGASTRODUODENOSCOPY  (EGD) WITH PROPOFOL;  Surgeon: Dolores Frame, MD;  Location: [facility];  Service: Gastroenterology;  Laterality: N/A;  730   NM MYOCAR PERF WALL MOTION  04/2011   lexiscan - fixed paical and basal-mid lateral defect, no reversible ischemia; EF 58%; PVCs noted; low risk   RENAL DOPPLER  09/2012   right renal artery 60-99% diameter reduction, left renal artery 1-59% diameter reduction   TOTAL KNEE ARTHROPLASTY Right    Dr. Hilda Lias   Family History  Problem Relation Age of Onset   Stroke Mother    Heart attack Son    Social History   Socioeconomic History   Marital status: Widowed    Spouse name: Not on file   Number of children: 3   Years of education: 36  Highest education level: High school graduate  Occupational History   Occupation: retired  Tobacco Use   Smoking status: Never   Smokeless tobacco: Never  Vaping Use   Vaping status: Never Used  Substance and Sexual Activity   Alcohol use: No   Drug use: No   Sexual activity: Not Currently    Birth control/protection: Post-menopausal  Other Topics Concern   Not on file  Social History Narrative   Not on file   Social Determinants of Health   Financial Resource Strain: Low Risk  (01/23/2023)   Overall Financial Resource Strain (CARDIA)    Difficulty of Paying Living Expenses: Not hard at all  Food Insecurity: No Food Insecurity (01/23/2023)   Hunger Vital Sign    Worried About Running Out of Food in the Last Year: Never true    Ran Out of Food in the Last Year: Never true  Transportation Needs: No Transportation Needs (01/23/2023)   PRAPARE - Administrator, Civil Service (Medical): No    Lack of Transportation (Non-Medical): No  Physical Activity: Insufficiently Active (01/23/2023)   Exercise Vital Sign    Days of Exercise per Week: 2 days    Minutes of Exercise per Session: 30 min  Stress: No Stress Concern Present (01/23/2023)   [employer] of Occupational Health - Occupational Stress  Questionnaire    Feeling of Stress : Not at all  Social Connections: Moderately Isolated (01/23/2023)   Social Connection and Isolation Panel [NHANES]    Frequency of Communication with Friends and Family: More than three times a week    Frequency of Social Gatherings with Friends and Family: More than three times a week    Attends Religious Services: More than 4 times per year    Active Member of [employer] or Organizations: No    Attends Banker Meetings: Never    Marital Status: Widowed    Tobacco Counseling Counseling given: Not Answered   Clinical Intake:  Pre-visit preparation completed: Yes  Pain : No/denies pain     Nutritional Risks: None Diabetes: Yes CBG done?: No Did pt. bring in CBG monitor from home?: No  How often do you need to have someone help you when you read instructions, pamphlets, or other written materials from your doctor or pharmacy?: 1 - Never  Interpreter Needed?: No  Information entered by :: Renie Ora, LPN   Activities of Daily Living    01/23/2023   10:03 AM  In your present state of health, do you have any difficulty performing the following activities:  Hearing? 0  Vision? 0  Difficulty concentrating or making decisions? 0  Walking or climbing stairs? 0  Dressing or bathing? 0  Doing errands, shopping? 0  Preparing Food and eating ? N  Using the Toilet? N  In the past six months, have you accidently leaked urine? N  Do you have problems with loss of bowel control? N  Managing your Medications? N  Managing your Finances? N  Housekeeping or managing your Housekeeping? N    Patient Care Team: Junie Spencer, FNP as PCP - General (Family Medicine) Runell Gess, MD as PCP - Cardiology (Cardiology) Michaelle Copas, MD as Referring Physician (Optometry)  Indicate any recent Medical Services you may have received from other than [facility] providers in the past year (date may be approximate).     Assessment:   This is  a routine wellness examination for [place].  Hearing/Vision screen Vision Screening -  Comments:: Wears rx glasses - up to date with routine eye exams with  Dr.Le   Dietary issues and exercise activities discussed:     Goals Addressed             This Visit's Progress    DIET - INCREASE WATER INTAKE   On track    Try to drink 6-8 glasses of water daily.       Depression Screen    01/23/2023   10:02 AM 08/10/2022    2:35 PM 06/05/2022   10:24 AM 01/03/2022    3:42 PM 12/28/2021    9:44 AM 12/07/2021    2:32 PM 05/19/2021    9:21 AM  PHQ 2/9 Scores  PHQ - 2 Score 0 0 0 0 0 0 0  PHQ- 9 Score  0 0 0 2 0 0    Fall Risk    01/23/2023   10:01 AM 12/18/2022    3:22 PM 10/08/2022    9:46 AM 08/24/2022    2:01 PM 08/10/2022    2:35 PM  Fall Risk   Falls in the past year? 0 0 0 0 0  Number falls in past yr: 0 1 0  0  Injury with Fall? 0 1 0  0  Risk for fall due to : No Fall Risks History of fall(s) History of fall(s)  No Fall Risks  Follow up Falls prevention discussed Falls evaluation completed;Education provided Education provided;Falls evaluation completed  Falls evaluation completed;Education provided    MEDICARE RISK AT HOME:  Medicare Risk at Home - 01/23/23 1001     Any stairs in or around the home? Yes    If so, are there any without handrails? No    Home free of loose throw rugs in walkways, pet beds, electrical cords, etc? Yes    Adequate lighting in your home to reduce risk of falls? Yes    Life alert? No    Use of a cane, walker or w/c? No    Grab bars in the bathroom? Yes    Shower chair or bench in shower? Yes    Elevated toilet seat or a handicapped toilet? Yes             TIMED UP AND GO:  Was the test performed?  No    Cognitive Function:    03/06/2021   11:32 AM  MMSE - Mini Mental State Exam  Orientation to time 4  Orientation to Place 4  Registration 3  Attention/ Calculation 4  Recall 0  Language- name 2 objects 2  Language- repeat 0   Language- follow 3 step command 3  Language- read & follow direction 1  Write a sentence 1  Copy design 0  Total score 22        01/23/2023   10:03 AM 01/03/2022    3:50 PM 09/29/2019    9:30 AM  6CIT Screen  What Year? 0 points 0 points 0 points  What month? 0 points 3 points 0 points  What time? 0 points 0 points 0 points  Count back from 20 0 points 0 points 0 points  Months in reverse 0 points 4 points 4 points  Repeat phrase 2 points 10 points 2 points  Total Score 2 points 17 points 6 points    Immunizations Immunization History  Administered Date(s) Administered   Fluad Quad(high Dose 65+) 04/18/2016, 04/08/2017, 03/26/2018, 03/06/2019, 05/19/2020, 05/19/2021   Influenza, High Dose Seasonal PF 04/07/2015, 04/18/2016, 04/08/2017, 03/26/2018  Influenza,inj,Quad PF,6+ Mos 04/18/2016, 04/08/2017, 03/26/2018   Influenza,trivalent, recombinat, inj, PF 04/12/2014   Influenza-Unspecified 04/12/2014   Moderna Sars-Covid-2 Vaccination 08/12/2019, 09/09/2019, 05/24/2020   Pneumococcal Conjugate-13 07/27/2016   Pneumococcal Polysaccharide-23 11/12/2010, 12/25/2017    TDAP status: Due, Education has been provided regarding the importance of this vaccine. Advised may receive this vaccine at local pharmacy or Health Dept. Aware to provide a copy of the vaccination record if obtained from local pharmacy or Health Dept. Verbalized acceptance and understanding.  Flu Vaccine status: Up to date  Pneumococcal vaccine status: Up to date  Covid-19 vaccine status: Completed vaccines  Qualifies for Shingles Vaccine? Yes   Zostavax completed No   Shingrix Completed?: No.    Education has been provided regarding the importance of this vaccine. Patient has been advised to call insurance company to determine out of pocket expense if they have not yet received this vaccine. Advised may also receive vaccine at local pharmacy or Health Dept. Verbalized acceptance and understanding.  Screening  Tests Health Maintenance  Topic Date Due   DTaP/Tdap/Td (1 - Tdap) Never done   Zoster Vaccines- Shingrix (1 of 2) Never done   COVID-19 Vaccine (4 - 2023-24 season) 02/16/2022   INFLUENZA VACCINE  01/17/2023   Diabetic kidney evaluation - Urine ACR  02/13/2023   OPHTHALMOLOGY EXAM  05/01/2023   FOOT EXAM  06/06/2023   HEMOGLOBIN A1C  06/20/2023   Diabetic kidney evaluation - eGFR measurement  01/18/2024   Medicare Annual Wellness (AWV)  01/23/2024   Pneumonia Vaccine 69+ Years old  Completed   DEXA SCAN  Completed   HPV VACCINES  Aged Out   Hepatitis C Screening  Discontinued    Health Maintenance  Health Maintenance Due  Topic Date Due   DTaP/Tdap/Td (1 - Tdap) Never done   Zoster Vaccines- Shingrix (1 of 2) Never done   COVID-19 Vaccine (4 - 2023-24 season) 02/16/2022   INFLUENZA VACCINE  01/17/2023   Diabetic kidney evaluation - Urine ACR  02/13/2023    Colorectal cancer screening: No longer required.   Mammogram status: No longer required due to age.  Bone Density status: Ordered 01/23/2023. Pt provided with contact info and advised to call to schedule appt.  Lung Cancer Screening: (Low Dose CT Chest recommended if Age 63-80 years, 20 pack-year currently smoking OR have quit w/in 15years.) does not qualify.   Lung Cancer Screening Referral: n/a  Additional Screening:  Hepatitis C Screening: does not qualify;   Vision Screening: Recommended annual ophthalmology exams for early detection of glaucoma and other disorders of the eye. Is the patient up to date with their annual eye exam?  Yes  Who is the provider or what is the name of the office in which the patient attends annual eye exams? Dr.Le  If pt is not established with a provider, would they like to be referred to a provider to establish care? No .   Dental Screening: Recommended annual dental exams for proper oral hygiene    Community Resource Referral / Chronic Care Management: CRR required this visit?   No   CCM required this visit?  No     Plan:     I have personally reviewed and noted the following in the patient's chart:   Medical and social history Use of alcohol, tobacco or illicit drugs  Current medications and supplements including opioid prescriptions. Patient is not currently taking opioid prescriptions. Functional ability and status Nutritional status Physical activity Advanced directives List of other physicians  Hospitalizations, surgeries, and ER visits in previous 12 months Vitals Screenings to include cognitive, depression, and falls Referrals and appointments  In addition, I have reviewed and discussed with patient certain preventive protocols, quality metrics, and best practice recommendations. A written personalized care plan for preventive services as well as general preventive health recommendations were provided to patient.     Lorrene Reid, LPN   02/20/6386   After Visit Summary: (MyChart) Due to this being a telephonic visit, the after visit summary with patients personalized plan was offered to patient via MyChart   Nurse Notes: Due TDAP Vaccine

## 2023-01-23 NOTE — Patient Instructions (Signed)
Ms. Haufe , Thank you for taking time to come for your Medicare Wellness Visit. I appreciate your ongoing commitment to your health goals. Please review the following plan we discussed and let me know if I can assist you in the future.   Referrals/Orders/Follow-Ups/Clinician Recommendations: Aim for 30 minutes of exercise or brisk walking, 6-8 glasses of water, and 5 servings of fruits and vegetables each day.   This is a list of the screening recommended for you and due dates:  Health Maintenance  Topic Date Due   DTaP/Tdap/Td vaccine (1 - Tdap) Never done   Zoster (Shingles) Vaccine (1 of 2) Never done   COVID-19 Vaccine (4 - 2023-24 season) 02/16/2022   Flu Shot  01/17/2023   Yearly kidney health urinalysis for diabetes  02/13/2023   Eye exam for diabetics  05/01/2023   Complete foot exam   06/06/2023   Hemoglobin A1C  06/20/2023   Yearly kidney function blood test for diabetes  01/18/2024   Medicare Annual Wellness Visit  01/23/2024   Pneumonia Vaccine  Completed   DEXA scan (bone density measurement)  Completed   HPV Vaccine  Aged Out   Hepatitis C Screening  Discontinued    Advanced directives: (Copy Requested) Please bring a copy of your health care power of attorney and living will to the office to be added to your chart at your convenience.  Next Medicare Annual Wellness Visit scheduled for next year: Yes  Preventive Care 59 Years and Older, Female Preventive care refers to lifestyle choices and visits with your health care provider that can promote health and wellness. What does preventive care include? A yearly physical exam. This is also called an annual well check. Dental exams once or twice a year. Routine eye exams. Ask your health care provider how often you should have your eyes checked. Personal lifestyle choices, including: Daily care of your teeth and gums. Regular physical activity. Eating a healthy diet. Avoiding tobacco and drug use. Limiting alcohol  use. Practicing safe sex. Taking low-dose aspirin every day. Taking vitamin and mineral supplements as recommended by your health care provider. What happens during an annual well check? The services and screenings done by your health care provider during your annual well check will depend on your age, overall health, lifestyle risk factors, and family history of disease. Counseling  Your health care provider may ask you questions about your: Alcohol use. Tobacco use. Drug use. Emotional well-being. Home and relationship well-being. Sexual activity. Eating habits. History of falls. Memory and ability to understand (cognition). Work and work Astronomer. Reproductive health. Screening  You may have the following tests or measurements: Height, weight, and BMI. Blood pressure. Lipid and cholesterol levels. These may be checked every 5 years, or more frequently if you are over 32 years old. Skin check. Lung cancer screening. You may have this screening every year starting at age 53 if you have a 30-pack-year history of smoking and currently smoke or have quit within the past 15 years. Fecal occult blood test (FOBT) of the stool. You may have this test every year starting at age 30. Flexible sigmoidoscopy or colonoscopy. You may have a sigmoidoscopy every 5 years or a colonoscopy every 10 years starting at age 39. Hepatitis C blood test. Hepatitis B blood test. Sexually transmitted disease (STD) testing. Diabetes screening. This is done by checking your blood sugar (glucose) after you have not eaten for a while (fasting). You may have this done every 1-3 years. Bone density scan.  This is done to screen for osteoporosis. You may have this done starting at age 44. Mammogram. This may be done every 1-2 years. Talk to your health care provider about how often you should have regular mammograms. Talk with your health care provider about your test results, treatment options, and if necessary,  the need for more tests. Vaccines  Your health care provider may recommend certain vaccines, such as: Influenza vaccine. This is recommended every year. Tetanus, diphtheria, and acellular pertussis (Tdap, Td) vaccine. You may need a Td booster every 10 years. Zoster vaccine. You may need this after age 26. Pneumococcal 13-valent conjugate (PCV13) vaccine. One dose is recommended after age 21. Pneumococcal polysaccharide (PPSV23) vaccine. One dose is recommended after age 55. Talk to your health care provider about which screenings and vaccines you need and how often you need them. This information is not intended to replace advice given to you by your health care provider. Make sure you discuss any questions you have with your health care provider. Document Released: 07/01/2015 Document Revised: 02/22/2016 Document Reviewed: 04/05/2015 Elsevier Interactive Patient Education  2017 [employer].  Fall Prevention in the Home Falls can cause injuries. They can happen to people of all ages. There are many things you can do to make your home safe and to help prevent falls. What can I do on the outside of my home? Regularly fix the edges of walkways and driveways and fix any cracks. Remove anything that might make you trip as you walk through a door, such as a raised step or threshold. Trim any bushes or trees on the path to your home. Use bright outdoor lighting. Clear any walking paths of anything that might make someone trip, such as rocks or tools. Regularly check to see if handrails are loose or broken. Make sure that both sides of any steps have handrails. Any raised decks and porches should have guardrails on the edges. Have any leaves, snow, or ice cleared regularly. Use sand or salt on walking paths during winter. Clean up any spills in your garage right away. This includes oil or grease spills. What can I do in the bathroom? Use night lights. Install grab bars by the toilet and in the  tub and shower. Do not use towel bars as grab bars. Use non-skid mats or decals in the tub or shower. If you need to sit down in the shower, use a plastic, non-slip stool. Keep the floor dry. Clean up any water that spills on the floor as soon as it happens. Remove soap buildup in the tub or shower regularly. Attach bath mats securely with double-sided non-slip rug tape. Do not have throw rugs and other things on the floor that can make you trip. What can I do in the bedroom? Use night lights. Make sure that you have a light by your bed that is easy to reach. Do not use any sheets or blankets that are too big for your bed. They should not hang down onto the floor. Have a firm chair that has side arms. You can use this for support while you get dressed. Do not have throw rugs and other things on the floor that can make you trip. What can I do in the kitchen? Clean up any spills right away. Avoid walking on wet floors. Keep items that you use a lot in easy-to-reach places. If you need to reach something above you, use a strong step stool that has a grab bar. Keep electrical cords  out of the way. Do not use floor polish or wax that makes floors slippery. If you must use wax, use non-skid floor wax. Do not have throw rugs and other things on the floor that can make you trip. What can I do with my stairs? Do not leave any items on the stairs. Make sure that there are handrails on both sides of the stairs and use them. Fix handrails that are broken or loose. Make sure that handrails are as long as the stairways. Check any carpeting to make sure that it is firmly attached to the stairs. Fix any carpet that is loose or worn. Avoid having throw rugs at the top or bottom of the stairs. If you do have throw rugs, attach them to the floor with carpet tape. Make sure that you have a light switch at the top of the stairs and the bottom of the stairs. If you do not have them, ask someone to add them for  you. What else can I do to help prevent falls? Wear shoes that: Do not have high heels. Have rubber bottoms. Are comfortable and fit you well. Are closed at the toe. Do not wear sandals. If you use a stepladder: Make sure that it is fully opened. Do not climb a closed stepladder. Make sure that both sides of the stepladder are locked into place. Ask someone to hold it for you, if possible. Clearly mark and make sure that you can see: Any grab bars or handrails. First and last steps. Where the edge of each step is. Use tools that help you move around (mobility aids) if they are needed. These include: Canes. Walkers. Scooters. Crutches. Turn on the lights when you go into a dark area. Replace any light bulbs as soon as they burn out. Set up your furniture so you have a clear path. Avoid moving your furniture around. If any of your floors are uneven, fix them. If there are any pets around you, be aware of where they are. Review your medicines with your doctor. Some medicines can make you feel dizzy. This can increase your chance of falling. Ask your doctor what other things that you can do to help prevent falls. This information is not intended to replace advice given to you by your health care provider. Make sure you discuss any questions you have with your health care provider. Document Released: 03/31/2009 Document Revised: 11/10/2015 Document Reviewed: 07/09/2014 Elsevier Interactive Patient Education  2017 [employer].

## 2023-01-24 ENCOUNTER — Other Ambulatory Visit: Payer: Self-pay

## 2023-01-28 ENCOUNTER — Other Ambulatory Visit: Payer: Self-pay | Admitting: Family

## 2023-01-31 ENCOUNTER — Other Ambulatory Visit: Payer: Self-pay | Admitting: Family

## 2023-01-31 DIAGNOSIS — Z978 Presence of other specified devices: Secondary | ICD-10-CM | POA: Diagnosis not present

## 2023-01-31 DIAGNOSIS — I110 Hypertensive heart disease with heart failure: Secondary | ICD-10-CM | POA: Diagnosis not present

## 2023-01-31 DIAGNOSIS — I10 Essential (primary) hypertension: Secondary | ICD-10-CM

## 2023-01-31 DIAGNOSIS — M150 Primary generalized (osteo)arthritis: Secondary | ICD-10-CM | POA: Diagnosis not present

## 2023-01-31 DIAGNOSIS — R1310 Dysphagia, unspecified: Secondary | ICD-10-CM | POA: Diagnosis not present

## 2023-01-31 DIAGNOSIS — Z87440 Personal history of urinary (tract) infections: Secondary | ICD-10-CM | POA: Diagnosis not present

## 2023-01-31 DIAGNOSIS — I2510 Atherosclerotic heart disease of native coronary artery without angina pectoris: Secondary | ICD-10-CM | POA: Diagnosis not present

## 2023-01-31 DIAGNOSIS — E119 Type 2 diabetes mellitus without complications: Secondary | ICD-10-CM | POA: Diagnosis not present

## 2023-01-31 DIAGNOSIS — E46 Unspecified protein-calorie malnutrition: Secondary | ICD-10-CM | POA: Diagnosis not present

## 2023-01-31 DIAGNOSIS — I340 Nonrheumatic mitral (valve) insufficiency: Secondary | ICD-10-CM | POA: Diagnosis not present

## 2023-01-31 DIAGNOSIS — G250 Essential tremor: Secondary | ICD-10-CM | POA: Diagnosis not present

## 2023-01-31 DIAGNOSIS — G9340 Encephalopathy, unspecified: Secondary | ICD-10-CM | POA: Diagnosis not present

## 2023-01-31 DIAGNOSIS — K219 Gastro-esophageal reflux disease without esophagitis: Secondary | ICD-10-CM | POA: Diagnosis not present

## 2023-01-31 DIAGNOSIS — Z951 Presence of aortocoronary bypass graft: Secondary | ICD-10-CM | POA: Diagnosis not present

## 2023-01-31 DIAGNOSIS — H6123 Impacted cerumen, bilateral: Secondary | ICD-10-CM | POA: Diagnosis not present

## 2023-01-31 DIAGNOSIS — R531 Weakness: Secondary | ICD-10-CM | POA: Diagnosis not present

## 2023-01-31 DIAGNOSIS — I69398 Other sequelae of cerebral infarction: Secondary | ICD-10-CM | POA: Diagnosis not present

## 2023-01-31 DIAGNOSIS — Z7901 Long term (current) use of anticoagulants: Secondary | ICD-10-CM | POA: Diagnosis not present

## 2023-01-31 DIAGNOSIS — Z9181 History of falling: Secondary | ICD-10-CM | POA: Diagnosis not present

## 2023-01-31 DIAGNOSIS — I959 Hypotension, unspecified: Secondary | ICD-10-CM | POA: Diagnosis not present

## 2023-01-31 DIAGNOSIS — E782 Mixed hyperlipidemia: Secondary | ICD-10-CM | POA: Diagnosis not present

## 2023-01-31 DIAGNOSIS — Z7984 Long term (current) use of oral hypoglycemic drugs: Secondary | ICD-10-CM | POA: Diagnosis not present

## 2023-01-31 DIAGNOSIS — R600 Localized edema: Secondary | ICD-10-CM

## 2023-01-31 DIAGNOSIS — I5022 Chronic systolic (congestive) heart failure: Secondary | ICD-10-CM | POA: Diagnosis not present

## 2023-01-31 DIAGNOSIS — I4819 Other persistent atrial fibrillation: Secondary | ICD-10-CM | POA: Diagnosis not present

## 2023-02-01 ENCOUNTER — Encounter: Payer: Self-pay | Admitting: Family

## 2023-02-19 ENCOUNTER — Ambulatory Visit (INDEPENDENT_AMBULATORY_CARE_PROVIDER_SITE_OTHER): Payer: Medicare Other | Admitting: Family

## 2023-02-19 ENCOUNTER — Ambulatory Visit: Payer: Medicare Other

## 2023-02-19 ENCOUNTER — Encounter: Payer: Self-pay | Admitting: Family

## 2023-02-19 VITALS — BP 136/70 | HR 63 | Temp 96.8°F | Ht 65.0 in | Wt 124.0 lb

## 2023-02-19 DIAGNOSIS — I2510 Atherosclerotic heart disease of native coronary artery without angina pectoris: Secondary | ICD-10-CM | POA: Diagnosis not present

## 2023-02-19 DIAGNOSIS — E1169 Type 2 diabetes mellitus with other specified complication: Secondary | ICD-10-CM | POA: Diagnosis not present

## 2023-02-19 DIAGNOSIS — M150 Primary generalized (osteo)arthritis: Secondary | ICD-10-CM

## 2023-02-19 DIAGNOSIS — I10 Essential (primary) hypertension: Secondary | ICD-10-CM | POA: Diagnosis not present

## 2023-02-19 DIAGNOSIS — I2583 Coronary atherosclerosis due to lipid rich plaque: Secondary | ICD-10-CM | POA: Diagnosis not present

## 2023-02-19 DIAGNOSIS — M159 Polyosteoarthritis, unspecified: Secondary | ICD-10-CM

## 2023-02-19 DIAGNOSIS — K219 Gastro-esophageal reflux disease without esophagitis: Secondary | ICD-10-CM | POA: Diagnosis not present

## 2023-02-19 DIAGNOSIS — I482 Chronic atrial fibrillation, unspecified: Secondary | ICD-10-CM

## 2023-02-19 DIAGNOSIS — E46 Unspecified protein-calorie malnutrition: Secondary | ICD-10-CM

## 2023-02-19 NOTE — Progress Notes (Signed)
Subjective:    Patient ID: Virginia [name], female    DOB: Mar 09, 1941, 82 y.o.   MRN: [id number]  Chief Complaint  Patient presents with   Follow-up    Going to dentists having to go to oral surgery and on antibiotic does not know the name    PT presents to the office today for chronic follow up and follow up on weight loss.    She is taking  Remeron 7.5 mg because she had lost 47 lbs and was down to 108 lb since 08/10/22. She gained 16 lb back.  Denies any SOB, chest pain, or edema. She is drinking Ensure daily. She is taking Lasix 20 mg daily.      02/19/2023    2:24 PM 01/23/2023   10:00 AM 01/18/2023   11:18 AM  Last 3 Weights  Weight (lbs) 124 lb 122 lb 122 lb 3.2 oz  Weight (kg) 56.246 kg 55.339 kg 55.43 kg     Pt lives at home with her Down Syndrome daughter.    She had seen GI for GI Bleed and was cleared. Continue to avoid NSAID's.    She is followed by Cardiologists annually for A Fib, CAD, and HTN. Continues to take Eliquis. These are stable.    She was started on lasix for edema and CHF. No edema or SOB at this time.  Hypertension This is a chronic problem. The current episode started more than 1 year ago. The problem has been resolved since onset. The problem is controlled. Pertinent negatives include no blurred vision, malaise/fatigue, peripheral edema or shortness of breath. Risk factors for coronary artery disease include dyslipidemia, diabetes mellitus and sedentary lifestyle. The current treatment provides moderate improvement.  Congestive Heart Failure Presents for follow-up visit. Pertinent negatives include no edema, fatigue or shortness of breath. The symptoms have been stable.  Gastroesophageal Reflux She complains of belching and heartburn. This is a chronic problem. The current episode started more than 1 year ago. The problem occurs occasionally. Pertinent negatives include no fatigue. She has tried a PPI for the symptoms. The treatment provided moderate  relief.  Diabetes She presents for her follow-up diabetic visit. She has type 2 diabetes mellitus. Pertinent negatives for diabetes include no blurred vision, no fatigue and no foot paresthesias. Diabetic complications include heart disease. Risk factors for coronary artery disease include dyslipidemia, diabetes mellitus, hypertension, sedentary lifestyle and post-menopausal. (Does not check glucose at home)  Depression        This is a chronic problem.  The current episode started more than 1 year ago.   The problem occurs intermittently.  Associated symptoms include no fatigue, no helplessness, no hopelessness and not sad.     Review of Systems  Constitutional:  Negative for fatigue and malaise/fatigue.  Eyes:  Negative for blurred vision.  Respiratory:  Negative for shortness of breath.   Gastrointestinal:  Positive for heartburn.  Psychiatric/Behavioral:  Positive for depression.   All other systems reviewed and are negative.      Objective:   Physical Exam Vitals reviewed.  Constitutional:      General: She is not in acute distress.    Appearance: She is well-developed.  HENT:     Head: Normocephalic and atraumatic.     Right Ear: External ear normal.  Eyes:     Pupils: Pupils are equal, round, and reactive to light.  Neck:     Thyroid: No thyromegaly.  Cardiovascular:     Rate and Rhythm: Normal  rate. Rhythm irregular.     Heart sounds: Normal heart sounds. No murmur heard. Pulmonary:     Effort: Pulmonary effort is normal. No respiratory distress.     Breath sounds: Normal breath sounds. No wheezing.  Abdominal:     General: Bowel sounds are normal. There is no distension.     Palpations: Abdomen is soft.     Tenderness: There is no abdominal tenderness.  Musculoskeletal:        General: No tenderness. Normal range of motion.     Cervical back: Normal range of motion and neck supple.  Skin:    General: Skin is warm and dry.  Neurological:     Mental Status: She  is alert and oriented to person, place, and time.     Cranial Nerves: No cranial nerve deficit.     Motor: Weakness present.     Gait: Gait abnormal (using rolling walker).     Deep Tendon Reflexes: Reflexes are normal and symmetric.  Psychiatric:        Behavior: Behavior normal.        Thought Content: Thought content normal.        Judgment: Judgment normal.      BP 136/70   Pulse 63   Temp (!) 96.8 F (36 C) (Temporal)   Ht 5\' 5"  (1.651 m)   Wt 124 lb (56.2 kg)   BMI 20.63 kg/m      Assessment & Plan:   Virginia [name] comes in today with chief complaint of Follow-up (Going to dentists having to go to oral surgery and on antibiotic does not know the name )   Diagnosis and orders addressed:  1. Type 2 diabetes mellitus with other specified complication, without long-term current use of insulin (HCC)  2. Protein malnutrition (HCC  3. Primary hypertension   4. Primary osteoarthritis involving multiple joints  5. Gastroesophageal reflux disease, unspecified whether esophagitis present  6. Chronic atrial fibrillation (HCC)  7. Coronary artery disease due to lipid rich plaque   Labs reviewed from last visit Continue current medications  Will continue current dose of Remeron 7.5 mg and Ensure. Weight stable. High protein diet. If weight decreases will need to increase Remeron.  Health Maintenance reviewed Diet and exercise encouraged  Follow up plan: 3 months    Jannifer Rodney, FNP

## 2023-02-19 NOTE — Patient Instructions (Signed)
High-Protein and High-Calorie Diet Eating high-protein and high-calorie foods can help you to gain weight, heal after an injury, and recover after an illness or surgery. The specific amount of daily protein and calories you need depends on: Your body weight. The reason this diet is recommended for you. Generally, a high-protein, high-calorie diet involves: Eating 250-500 extra calories each day. Making sure that you get enough of your daily calories from protein. Ask your health care provider how many of your calories should come from protein. Talk with a health care provider or a dietitian about how much protein and how many calories you need each day. Follow the diet as directed by your health care provider. What are tips for following this plan?  Reading food labels Check the nutrition facts label for calories, grams of fat and protein. Items with more than 4 grams of protein are high-protein foods. Preparing meals Add whole milk, half-and-half, or heavy cream to cereal, pudding, soup, or hot cocoa. Add whole milk to instant breakfast drinks. Add peanut butter to oatmeal or smoothies. Add powdered milk to baked goods, smoothies, or milkshakes. Add powdered milk, cream, or butter to mashed potatoes. Add cheese to cooked vegetables. Make whole-milk yogurt parfaits. Top them with granola, fruit, or nuts. Add cottage cheese to fruit. Add avocado, cheese, or both to sandwiches or salads. Add avocado to smoothies. Add meat, poultry, or seafood to rice, pasta, casseroles, salads, and soups. Use mayonnaise when making egg salad, chicken salad, or tuna salad. Use peanut butter as a dip for fruits and vegetables or as a topping for pretzels, celery, or crackers. Add beans to casseroles, dips, and spreads. Add pureed beans to sauces and soups. Replace calorie-free drinks with calorie-containing drinks, such as milk and fruit juice. Replace water with milk or heavy cream when making foods such as  oatmeal, pudding, or cocoa. Add oil or butter to cooked vegetables and grains. Add cream cheese to sandwiches or as a topping on crackers and bread. Make cream-based pastas and soups. General information Ask your health care provider if you should take a nutritional supplement. Try to eat six small meals each day instead of three large meals. A general goal is to eat every 2 to 3 hours. Eat a balanced diet. In each meal, include one food that is high in protein and one food with fat in it. Keep nutritious snacks available, such as nuts, trail mixes, dried fruit, and yogurt. If you have kidney disease or diabetes, talk with your health care provider about how much protein is safe for you. Too much protein may put extra stress on your kidneys. Drink your calories. Choose high-calorie drinks and have them after your meals. Consider setting a timer to remind you to eat. You will want to eat even if you do not feel very hungry. What high-protein foods should I eat?  Vegetables Soybeans. Peas. Grains Quinoa. Bulgur wheat. Buckwheat. Meats and other proteins Beef, pork, and poultry. Fish and seafood. Eggs. Tofu. Textured vegetable protein (TVP). Peanut butter. Nuts and seeds. Dried beans. Protein powders. Hummus. Dairy Whole milk. Whole-milk yogurt. Powdered milk. Cheese. Cottage Cheese. Eggnog. Beverages High-protein supplement drinks. Soy milk. Other foods Protein bars. The items listed above may not be a complete list of foods and beverages you can eat and drink. Contact a dietitian for more information. What high-calorie foods should I eat? Fruits Dried fruit. Fruit leather. Canned fruit in syrup. Fruit juice. Avocado. Vegetables Vegetables cooked in oil or butter. Fried potatoes. Grains   Pasta. Quick breads. Muffins. Pancakes. Ready-to-eat cereal. Meats and other proteins Peanut butter. Nuts and seeds. Dairy Heavy cream. Whipped cream. Cream cheese. Sour cream. Ice cream. Custard.  Pudding. Whole milk dairy products. Beverages Meal-replacement beverages. Nutrition shakes. Fruit juice. Seasonings and condiments Salad dressing. Mayonnaise. Alfredo sauce. Fruit preserves or jelly. Honey. Syrup. Sweets and desserts Cake. Cookies. Pie. Pastries. Candy bars. Chocolate. Fats and oils Butter or margarine. Oil. Gravy. Other foods Meal-replacement bars. The items listed above may not be a complete list of foods and beverages you can eat and drink. Contact a dietitian for more information. Summary A high-protein, high-calorie diet can help you gain weight or heal faster after an injury, illness, or surgery. To increase your protein and calories, add ingredients such as whole milk, peanut butter, cheese, beans, meat, or seafood to meal items. To get enough extra calories each day, include high-calorie foods and beverages at each meal. Adding a high-calorie drink or shake can be an easy way to help you get enough calories each day. Talk with your healthcare provider or dietitian about the best options for you. This information is not intended to replace advice given to you by your health care provider. Make sure you discuss any questions you have with your health care provider. Document Revised: 05/06/2020 Document Reviewed: 05/08/2020 Elsevier Patient Education  2024 [employer]  

## 2023-02-25 ENCOUNTER — Other Ambulatory Visit: Payer: Self-pay | Admitting: Family

## 2023-02-25 DIAGNOSIS — I509 Heart failure, unspecified: Secondary | ICD-10-CM

## 2023-02-28 ENCOUNTER — Encounter: Payer: Self-pay | Admitting: Family

## 2023-03-02 ENCOUNTER — Other Ambulatory Visit: Payer: Self-pay | Admitting: Family

## 2023-04-12 ENCOUNTER — Other Ambulatory Visit: Payer: Self-pay | Admitting: Family

## 2023-05-28 ENCOUNTER — Other Ambulatory Visit: Payer: Self-pay

## 2023-05-28 ENCOUNTER — Encounter (HOSPITAL_COMMUNITY): Payer: Self-pay | Admitting: *Deleted

## 2023-05-28 ENCOUNTER — Emergency Department (HOSPITAL_COMMUNITY)
Admission: EM | Admit: 2023-05-28 | Discharge: 2023-05-28 | Disposition: A | Discharge status: Home / Self Care | Payer: Medicare Other | Attending: Emergency Medicine | Admitting: Emergency Medicine

## 2023-05-28 DIAGNOSIS — Z79899 Other long term (current) drug therapy: Secondary | ICD-10-CM | POA: Diagnosis not present

## 2023-05-28 DIAGNOSIS — M25562 Pain in left knee: Secondary | ICD-10-CM | POA: Diagnosis not present

## 2023-05-28 DIAGNOSIS — Z7901 Long term (current) use of anticoagulants: Secondary | ICD-10-CM | POA: Insufficient documentation

## 2023-05-28 DIAGNOSIS — M1120 Other chondrocalcinosis, unspecified site: Secondary | ICD-10-CM | POA: Insufficient documentation

## 2023-05-28 DIAGNOSIS — I10 Essential (primary) hypertension: Secondary | ICD-10-CM | POA: Insufficient documentation

## 2023-05-28 DIAGNOSIS — Z951 Presence of aortocoronary bypass graft: Secondary | ICD-10-CM | POA: Diagnosis not present

## 2023-05-28 DIAGNOSIS — E119 Type 2 diabetes mellitus without complications: Secondary | ICD-10-CM | POA: Diagnosis not present

## 2023-05-28 DIAGNOSIS — I2510 Atherosclerotic heart disease of native coronary artery without angina pectoris: Secondary | ICD-10-CM | POA: Diagnosis not present

## 2023-05-28 LAB — BASIC METABOLIC PANEL
Anion gap: 12 (ref 5–15)
BUN: 27 mg/dL — ABNORMAL HIGH (ref 8–23)
CO2: 26 mmol/L (ref 22–32)
Calcium: 9.8 mg/dL (ref 8.9–10.3)
Chloride: 100 mmol/L (ref 98–111)
Creatinine, Ser: 0.67 mg/dL (ref 0.44–1.00)
GFR, Estimated: >60 mL/min (ref >60)
Glucose, Bld: 152 mg/dL — ABNORMAL HIGH (ref 70–99)
Potassium: 3.7 mmol/L (ref 3.5–5.1)
Sodium: 138 mmol/L (ref 135–145)

## 2023-05-28 LAB — CBC WITH DIFFERENTIAL/PLATELET
Abs Immature Granulocytes: 0.02 10*3/uL (ref 0.00–0.07)
Basophils Absolute: 0 10*3/uL (ref 0.0–0.1)
Basophils Relative: 0 %
Eosinophils Absolute: 0 10*3/uL (ref 0.0–0.5)
Eosinophils Relative: 0 %
HCT: 42.3 % (ref 36.0–46.0)
Hemoglobin: 13.6 g/dL (ref 12.0–15.0)
Immature Granulocytes: 0 %
Lymphocytes Relative: 16 %
Lymphs Abs: 1.2 10*3/uL (ref 0.7–4.0)
MCH: 31.7 pg (ref 26.0–34.0)
MCHC: 32.2 g/dL (ref 30.0–36.0)
MCV: 98.6 fL (ref 80.0–100.0)
Monocytes Absolute: 0.9 10*3/uL (ref 0.1–1.0)
Monocytes Relative: 12 %
Neutro Abs: 5.4 10*3/uL (ref 1.7–7.7)
Neutrophils Relative %: 72 %
Platelets: 192 10*3/uL (ref 150–400)
RBC: 4.29 MIL/uL (ref 3.87–5.11)
RDW: 13.8 % (ref 11.5–15.5)
WBC: 7.6 10*3/uL (ref 4.0–10.5)
nRBC: 0 % (ref 0.0–0.2)

## 2023-05-28 LAB — SYNOVIAL CELL COUNT + DIFF, W/ CRYSTALS
Eosinophils-Synovial: 0 % (ref 0–1)
Lymphocytes-Synovial Fld: 0 % (ref 0–20)
Monocyte-Macrophage-Synovial Fluid: 6 % — ABNORMAL LOW (ref 50–90)
Neutrophil, Synovial: 94 % — ABNORMAL HIGH (ref 0–25)
Other Cells-SYN: 0
WBC, Synovial: 9820 /mm3 — ABNORMAL HIGH (ref 0–200)

## 2023-05-28 MED ORDER — OXYCODONE HCL 5 MG PO TABS
2.5000 mg | ORAL_TABLET | Freq: Once | ORAL | Status: AC
  Administered 2023-05-28: 2.5 mg via ORAL
  Filled 2023-05-28: qty 1

## 2023-05-28 MED ORDER — COLCHICINE 0.6 MG PO TABS
1.2000 mg | ORAL_TABLET | Freq: Once | ORAL | Status: AC
  Administered 2023-05-28: 1.2 mg via ORAL
  Filled 2023-05-28: qty 2

## 2023-05-28 MED ORDER — LIDOCAINE-EPINEPHRINE (PF) 2 %-1:200000 IJ SOLN
10.0000 mL | Freq: Once | INTRAMUSCULAR | Status: AC
  Administered 2023-05-28: 10 mL via INTRADERMAL
  Filled 2023-05-28: qty 20

## 2023-05-28 MED ORDER — ACETAMINOPHEN 500 MG PO TABS
1000.0000 mg | ORAL_TABLET | Freq: Once | ORAL | Status: AC
  Administered 2023-05-28: 1000 mg via ORAL
  Filled 2023-05-28: qty 2

## 2023-05-28 MED ORDER — PREDNISONE 10 MG PO TABS: ORAL_TABLET | ORAL | 0 refills | Status: DC

## 2023-05-28 MED ORDER — PREDNISONE 20 MG PO TABS
30.0000 mg | ORAL_TABLET | Freq: Once | ORAL | Status: AC
  Administered 2023-05-28: 30 mg via ORAL
  Filled 2023-05-28: qty 1

## 2023-05-28 NOTE — ED Triage Notes (Signed)
Pt in c/o L knee pain chronic worsening yesterday, denies injury, lives at home with family uses cane and wheelchair, swelling noted to L knee, A&O x4

## 2023-05-28 NOTE — ED Provider Notes (Signed)
  [facility] AT [facility] Provider Note   CSN: [id number] Arrival date & time: 05/28/23  6213     Chief Complaint  Patient presents with   Knee Pain    Virginia [name] is a 82 y.o. female.   Knee Pain   Procedures .Joint Aspiration/Arthrocentesis  Date/Time: 05/28/2023 10:56 AM  Performed by: Peter Garter, PA Authorized by: Peter Garter, PA   Consent:    Consent obtained:  Verbal   Consent given by:  Patient   Risks, benefits, and alternatives were discussed: yes     Risks discussed:  Incomplete drainage, bleeding, infection and pain   Alternatives discussed:  No treatment and delayed treatment Universal protocol:    Procedure explained and questions answered to patient or proxy's satisfaction: yes     Patient identity confirmed:  Verbally with patient and arm band Location:    Location:  Knee   Knee:  L knee Anesthesia:    Anesthesia method:  Local infiltration   Local anesthetic:  Lidocaine 2% WITH epi Procedure details:    Preparation: Patient was prepped and draped in usual sterile fashion     Needle gauge:  22 G   Ultrasound guidance: no     Approach:  Anterior   Aspirate amount:  50cc   Aspirate characteristics:  Yellow and serous   Steroid injected: no     Specimen collected: yes   Post-procedure details:    Dressing:  Adhesive bandage   Procedure completion:  Tolerated well, no immediate complications        Peter Garter, PA 05/28/23 1057    Loetta Rough, MD 05/28/23 1500

## 2023-05-28 NOTE — ED Provider Notes (Signed)
[place] EMERGENCY DEPARTMENT AT [facility] Provider Note   CSN: [id number] Arrival date & time: 05/28/23  0815     History  Chief Complaint  Patient presents with   Knee Pain    Virginia [name] is a 82 y.o. female with PMH as listed below who presents with L knee pain. States that it is chronic worsening yesterday, denies injury, lives at home with family uses cane and wheelchair. She states that this pain in her L knee seems to be different than her normal pain and worse. She is accompanied by her son and daughter. Patient's daughter lives with her and cares for her as best she can but patient has had difficulty ambulating/transferring due to severe pain. She denies f/c, new injuries, falls, trauma. No redness to the knee but has noted some redness on the top of the left foot, though the swelling is confined to the left knee. No h/o DVT. No numbness/tingling in that leg. Has had steroid injections in that knee before but not recently.   Past Medical History:  Diagnosis Date   Coronary artery disease    status post coronary artery bypass grafting x3 in 1999   GERD (gastroesophageal reflux disease)    Hyperlipidemia    Hypertension    Renal artery stenosis (HCC)    S/P CABG (coronary artery bypass graft) 06/18/1997   x3   Type 2 diabetes mellitus (HCC)        Home Medications Prior to Admission medications   Medication Sig Start Date End Date Taking? Authorizing Provider  acetaminophen (TYLENOL) 500 MG tablet Take 500-1,000 mg by mouth every 6 (six) hours as needed (for pain.).   Yes [provider]  apixaban (ELIQUIS) 5 MG TABS tablet Take 1 tablet (5 mg total) by mouth 2 (two) times daily. 12/18/22  Yes Hawks, Christy A, FNP  ascorbic acid (VITAMIN C) 500 MG tablet Take 500 mg by mouth daily.   Yes [provider]  atorvastatin (LIPITOR) 80 MG tablet TAKE 1 TABLET BY MOUTH DAILY 04/12/23  Yes Hawks, Christy A, FNP  calcium-vitamin D (OSCAL WITH  D) 500-5 MG-MCG tablet Take 1 tablet by mouth.   Yes [provider]  cetirizine (ZYRTEC) 10 MG tablet TAKE ONE (1) TABLET BY MOUTH EVERY DAY 03/04/23  Yes Hawks, Christy A, FNP  dapagliflozin propanediol (FARXIGA) 10 MG TABS tablet TAKE 1 TABLET BY MOUTH DAILY  BEFORE BREAKFAST 02/25/23  Yes Hawks, Christy A, FNP  feeding supplement (ENSURE ENLIVE / ENSURE PLUS) LIQD Take 237 mLs by mouth 3 (three) times daily between meals. 12/04/22  Yes Hawks, Christy A, FNP  ferrous sulfate 325 (65 FE) MG tablet Take 650 mg by mouth at bedtime.   Yes [provider]  furosemide (LASIX) 20 MG tablet Take 1 tablet (20 mg total) by mouth daily. 01/18/23  Yes Hawks, Christy A, FNP  mirtazapine (REMERON) 7.5 MG tablet Take 1 tablet (7.5 mg total) by mouth at bedtime. 12/18/22  Yes Hawks, Christy A, FNP  pantoprazole (PROTONIX) 40 MG tablet Take 1 tablet (40 mg total) by mouth daily. 12/18/22  Yes Hawks, Christy A, FNP  PARoxetine (PAXIL) 10 MG tablet Take 1 tablet (10 mg total) by mouth every morning. 12/18/22  Yes Hawks, Christy A, FNP  predniSONE (DELTASONE) 10 MG tablet Take 3 tablets (30 mg total) by mouth daily for 2 days, THEN 2 tablets (20 mg total) daily for 2 days, THEN 1 tablet (10 mg total) daily for 2  days, THEN 0.5 tablets (5 mg total) daily for 2 days. 05/29/23 06/06/23 Yes Loetta Rough, MD  metoprolol succinate (TOPROL-XL) 50 MG 24 hr tablet TAKE 1 AND 1/2 TABLETS BY MOUTH  DAILY TAKE WITH OR IMMEDIATELY  FOLLOWING A MEAL 05/29/23   Hawks, Christy A, FNP  nystatin cream (MYCOSTATIN) Apply 1 Application topically 2 (two) times daily. Patient not taking: Reported on 05/28/2023 06/05/22   Junie Spencer, FNP      Allergies    Patient has no known allergies.    Review of Systems   Review of Systems A 10 point review of systems was performed and is negative unless otherwise reported in HPI.  Physical Exam Updated Vital Signs BP 120/87   Pulse 71   Temp 98.3 F (36.8 C)   Resp 16   Ht  5\' 5"  (1.651 m)   Wt 59 kg   SpO2 95%   BMI 21.63 kg/m  Physical Exam General: Normal appearing elderly female, lying in bed.  HEENT: Sclera anicteric, MMM, trachea midline.  Cardiology: RRR, no murmurs/rubs/gallops. BL DP pulses equal bilaterally.  Resp: Normal respiratory rate and effort. CTAB, no wheezes, rhonchi, crackles.  Abd: Soft, non-tender, non-distended. No rebound tenderness or guarding.  GU: Deferred. MSK: L knee effusion with no erythema or overlying induration, wounds, fluctuance. Diffuse TTP to joint. Pain with passive and active ROM. Able to flex/extend knee but with discomfort. No diffuse swelling throughout lower leg. Mild small area of erythema to top of left foot that is non-tender with no induration/fluctuance. No signs of trauma. Extremities without deformity. No cyanosis or clubbing. Skin: warm, dry Neuro: A&Ox4, CNs II-XII grossly intact. MAEs.  Psych: Normal mood and affect.   ED Results / Procedures / Treatments   Labs (all labs ordered are listed, but only abnormal results are displayed) Labs Reviewed  BASIC METABOLIC PANEL - Abnormal; Notable for the following components:      Result Value   Glucose, Bld 152 (*)    BUN 27 (*)    All other components within normal limits  SYNOVIAL CELL COUNT + DIFF, W/ CRYSTALS - Abnormal; Notable for the following components:   Color, Synovial YELLOW (*)    Appearance-Synovial CLOUDY (*)    WBC, Synovial 9,820 (*)    Neutrophil, Synovial 94 (*)    Monocyte-Macrophage-Synovial Fluid 6 (*)    All other components within normal limits  BODY FLUID CULTURE W GRAM STAIN  CBC WITH DIFFERENTIAL/PLATELET  GLUCOSE, BODY FLUID OTHER            PROTEIN, BODY FLUID (OTHER)  URIC ACID, BODY FLUID   Procedures Procedures   Please see separately noted arthrocentesis documentation performed by EDPA Sherral Hammers     Medications Ordered in ED Medications  lidocaine-EPINEPHrine (XYLOCAINE W/EPI) 2 %-1:200000 (PF) injection 10 mL  (10 mLs Intradermal Given by Other 05/28/23 1115)  acetaminophen (TYLENOL) tablet 1,000 mg (1,000 mg Oral Given 05/28/23 1228)  oxyCODONE (Oxy IR/ROXICODONE) immediate release tablet 2.5 mg (2.5 mg Oral Given 05/28/23 1228)  colchicine tablet 1.2 mg (1.2 mg Oral Given 05/28/23 1607)  predniSONE (DELTASONE) tablet 30 mg (30 mg Oral Given 05/28/23 1606)    ED Course/ Medical Decision Making/ A&P                          Medical Decision Making Amount and/or Complexity of Data Reviewed Labs: ordered. Decision-making details documented in ED Course.  Risk OTC drugs. Prescription drug  management.    This patient presents to the ED for concern of left knee pain, this involves an extensive number of treatment options, and is a complaint that carries with it a high risk of complications and morbidity.   MDM:    This patient presents with knee pain, suspicious for arthritis, whether septic, inflammatory, reactive, or osteoarthritic in nature. Able to flex and extend although somewhat limited by pain. Pain is atraumatic, so doubt, tibial plateau fracture or other acute unstable fracture. No e/o significant neurovascular compromise or compartment syndrome.  Lower c/f DVT given that swelling seems to be confined to L knee join effusion rather than entire LLE. Consider acute on chronic arthritis, gout vs pseudogout, septic arthritis. No succinct swelling of bursa to indicate bursitis or septic bursitis. Discussed arthrocentesis with patient and her family who understand and consent to procedure. Patient given pain control in the meantime and will attempt ambulation to assess ability to care for herself at home. Will also obtain basic labs.   Clinical Course as of 06/02/23 1603  Tue May 28, 2023  1153 WBC: 7.6 No leukocytosis  [HN]  1233 Arthrocentesis was collected at 11:15 AM and Cheney RN walked sample to lab at that time. Sample still does not say in process. RN called lab who stated they had  the sample but hadn't started running it yet. They will start running it now. [HN]  1423 Crystals, Fluid: INTRACELLULAR CALCIUM PYROPHOSPHATE CRYSTALS Patient likely with pseudogout in her left knee. Awaiting other synovial labs. [HN]  1504 Looks to be inflammatory arthritis. No organisms seen on gram stain. No septic arthritis.   Patient ambulated by RN and she was able to walk with walker [HN]  1536 Will avoid NSAIDs as she is on eliquis. Will load with 1.2 mg PO colchicine and give 30 mg prednisone PO as well.  [HN]    Clinical Course User Index [HN] Loetta Rough, MD    Labs: I Ordered, and personally interpreted labs.  The pertinent results include:  those listed above  Additional history obtained from chart review, family at bedside  Reevaluation: After the interventions noted above, I reevaluated the patient and found that they have :improved  Social Determinants of Health: Lives independently with help from her daughter  Disposition:  Patient with pseudogout in left knee. Given colchicine here and prescribed prednisone taper. Rx: R.I.C.E .  DC w/ discharge instructions/return precautions. All questions answered to patient's satisfaction.    Co morbidities that complicate the patient evaluation  Past Medical History:  Diagnosis Date   Coronary artery disease    status post coronary artery bypass grafting x3 in 1999   GERD (gastroesophageal reflux disease)    Hyperlipidemia    Hypertension    Renal artery stenosis (HCC)    S/P CABG (coronary artery bypass graft) 06/18/1997   x3   Type 2 diabetes mellitus (HCC)      Medicines Meds ordered this encounter  Medications   lidocaine-EPINEPHrine (XYLOCAINE W/EPI) 2 %-1:200000 (PF) injection 10 mL   acetaminophen (TYLENOL) tablet 1,000 mg   oxyCODONE (Oxy IR/ROXICODONE) immediate release tablet 2.5 mg   colchicine tablet 1.2 mg   predniSONE (DELTASONE) tablet 30 mg   predniSONE (DELTASONE) 10 MG tablet    Sig: Take 3  tablets (30 mg total) by mouth daily for 2 days, THEN 2 tablets (20 mg total) daily for 2 days, THEN 1 tablet (10 mg total) daily for 2 days, THEN 0.5 tablets (5 mg total) daily  for 2 days.    Dispense:  13 tablet    Refill:  0    I have reviewed the patients home medicines and have made adjustments as needed  Problem List / ED Course: Problem List Items Addressed This Visit   None Visit Diagnoses       Acute pain of left knee    -  Primary     Pseudogout       Relevant Medications   acetaminophen (TYLENOL) tablet 1,000 mg (Completed)   oxyCODONE (Oxy IR/ROXICODONE) immediate release tablet 2.5 mg (Completed)   colchicine tablet 1.2 mg (Completed)   predniSONE (DELTASONE) tablet 30 mg (Completed)   predniSONE (DELTASONE) 10 MG tablet                   This note was created using dictation software, which may contain spelling or grammatical errors.    Loetta Rough, MD 06/02/23 [phone]

## 2023-05-28 NOTE — ED Notes (Signed)
Pt verbalized understanding of discharge instructions. Opportunity for questions provided.  

## 2023-05-28 NOTE — Discharge Instructions (Addendum)
Thank you for coming to [facility] Emergency Department. You were seen for left knee pain/swelling. We did an exam, labs, and these showed pseudogout, a type of inflammatory arthritis caused by calcium pyrophosphate crystals in the knee. Rest, ice, elevation can help the pain. You were treated with colchicine in the emergency department as well as prednisone, and we have prescribed a prednisone taper to take at home. You can take 1,000 mg of tylenol every 8 hours for pain as well.   Please follow up with your primary care provider within 1 week.   Do not hesitate to return to the ED or call 911 if you experience: -Worsening symptoms -Pain so severe you cannot walk -Lightheadedness, passing out -Fevers/chills -Anything else that concerns you

## 2023-05-29 ENCOUNTER — Other Ambulatory Visit: Payer: Self-pay | Admitting: Family

## 2023-05-29 ENCOUNTER — Telehealth: Payer: Self-pay

## 2023-05-29 DIAGNOSIS — I482 Chronic atrial fibrillation, unspecified: Secondary | ICD-10-CM

## 2023-05-29 DIAGNOSIS — I2510 Atherosclerotic heart disease of native coronary artery without angina pectoris: Secondary | ICD-10-CM

## 2023-05-29 LAB — PROTEIN, BODY FLUID (OTHER): Total Protein, Body Fluid Other: 4.4 g/dL

## 2023-05-29 LAB — GLUCOSE, BODY FLUID OTHER: Glucose, Body Fluid Other: 129 mg/dL

## 2023-05-29 LAB — URIC ACID, BODY FLUID: Uric Acid Body Fluid: 4 mg/dL

## 2023-05-29 NOTE — Transitions of Care (Post Inpatient/ED Visit) (Signed)
05/29/2023  Name: Virginia [name] MRN: [id number] DOB: Mar 29, 1941  Today's TOC FU Call Status: Today's TOC FU Call Status:: Successful TOC FU Call Completed Patient's Name and Date of Birth confirmed.  Transition Care Management Follow-up Telephone Call Date of Discharge: 06/03/23 [name] Type of Discharge: Emergency Department Reason for ED Visit: Other: (knee pain) How have you been since you were released from the hospital?: Same Any questions or concerns?: No  Items Reviewed: Did you receive and understand the discharge instructions provided?: Yes Medications obtained,verified, and reconciled?: Yes (Medications Reviewed) Any new allergies since your discharge?: No Dietary orders reviewed?: NA Do you have support at home?: No  Medications Reviewed Today: Medications Reviewed Today     Reviewed by Karena Addison, LPN (Licensed Practical Nurse) on 05/29/23 at 1538  Med List Status: <None>   Medication Order Taking? Sig Documenting Provider Last Dose Status Informant  acetaminophen (TYLENOL) 500 MG tablet [id number] No Take 500-1,000 mg by mouth every 6 (six) hours as needed (for pain.). [provider] 05/28/2023 Active Self, Child  apixaban (ELIQUIS) 5 MG TABS tablet [id number] No Take 1 tablet (5 mg total) by mouth 2 (two) times daily. Junie Spencer, [place] 05/28/2023 0630 Active Self, Child  ascorbic acid (VITAMIN C) 500 MG tablet [id number] No Take 500 mg by mouth daily. [provider] 05/28/2023 Active Self, Child  atorvastatin (LIPITOR) 80 MG tablet [id number] No TAKE 1 TABLET BY MOUTH DAILY Junie Spencer, FNP 05/28/2023 Active Self, Child  calcium-vitamin D (OSCAL WITH D) 500-5 MG-MCG tablet [id number] No Take 1 tablet by mouth. [provider] 05/27/2023 Active Self, Child  cetirizine (ZYRTEC) 10 MG tablet [id number] No TAKE ONE (1) TABLET BY MOUTH EVERY DAY Junie Spencer, FNP 05/28/2023 Active Self, Child   dapagliflozin propanediol (FARXIGA) 10 MG TABS tablet [id number] No TAKE 1 TABLET BY MOUTH DAILY  BEFORE BREAKFAST Junie Spencer, FNP 05/28/2023 Active Self, Child  feeding supplement (ENSURE ENLIVE / ENSURE PLUS) LIQD [id number] No Take 237 mLs by mouth 3 (three) times daily between meals. Junie Spencer, [place] 05/28/2023 Active Self, Child  ferrous sulfate 325 (65 FE) MG tablet [id number] No Take 650 mg by mouth at bedtime. [provider] 05/27/2023 Active Self, Child  furosemide (LASIX) 20 MG tablet [id number] No Take 1 tablet (20 mg total) by mouth daily. Junie Spencer, [place] 05/28/2023 Active Self, Child  metoprolol succinate (TOPROL-XL) 50 MG 24 hr tablet [id number]  TAKE 1 AND 1/2 TABLETS BY MOUTH  DAILY TAKE WITH OR IMMEDIATELY  FOLLOWING A MEAL Hawks, Christy A, FNP  Active   mirtazapine (REMERON) 7.5 MG tablet [id number] No Take 1 tablet (7.5 mg total) by mouth at bedtime. Junie Spencer, FNP 05/27/2023 Active Self, Child  nystatin cream (MYCOSTATIN) [id number] No Apply 1 Application topically 2 (two) times daily.  Patient not taking: Reported on 05/28/2023   Junie Spencer, FNP Not Taking Active Self, Child  pantoprazole (PROTONIX) 40 MG tablet [id number] No Take 1 tablet (40 mg total) by mouth daily. Junie Spencer, FNP 05/28/2023 Active Self, Child  PARoxetine (PAXIL) 10 MG tablet [id number] No Take 1 tablet (10 mg total) by mouth every morning. Junie Spencer, FNP 05/28/2023 Active Self, Child  predniSONE (DELTASONE) 10 MG tablet [id number]  Take 3 tablets (30 mg total) by mouth daily for 2 days, THEN 2 tablets (20 mg total) daily for 2 days, THEN 1 tablet (10 mg total) daily for 2 days, THEN 0.5  tablets (5 mg total) daily for 2 days. Loetta Rough, MD  Active             Home Care and Equipment/Supplies: Were Home Health Services Ordered?: NA Any new equipment or medical supplies ordered?: NA  Functional Questionnaire: Do you need assistance with bathing/showering  or dressing?: No Do you need assistance with meal preparation?: No Do you need assistance with eating?: No Do you have difficulty maintaining continence: No Do you need assistance with getting out of bed/getting out of a chair/moving?: No Do you have difficulty managing or taking your medications?: No  Follow up appointments reviewed: PCP Follow-up appointment confirmed?: Yes Date of PCP follow-up appointment?: 06/03/23 Follow-up Provider: [facility] Follow-up appointment confirmed?: NA Do you need transportation to your follow-up appointment?: No Do you understand care options if your condition(s) worsen?: Yes-patient verbalized understanding    SIGNATURE Karena Addison, LPN [facility] Nurse Health Advisor Direct Dial [phone]

## 2023-05-31 LAB — BODY FLUID CULTURE W GRAM STAIN
Culture: NO GROWTH
Gram Stain: NONE SEEN

## 2023-06-03 ENCOUNTER — Ambulatory Visit (INDEPENDENT_AMBULATORY_CARE_PROVIDER_SITE_OTHER): Payer: Medicare Other | Admitting: Family

## 2023-06-03 ENCOUNTER — Encounter: Payer: Self-pay | Admitting: Family

## 2023-06-03 ENCOUNTER — Other Ambulatory Visit: Payer: Medicare Other

## 2023-06-03 VITALS — BP 123/69 | HR 57 | Temp 97.0°F | Resp 96 | Ht 65.0 in | Wt 139.0 lb

## 2023-06-03 DIAGNOSIS — I2583 Coronary atherosclerosis due to lipid rich plaque: Secondary | ICD-10-CM | POA: Diagnosis not present

## 2023-06-03 DIAGNOSIS — I10 Essential (primary) hypertension: Secondary | ICD-10-CM

## 2023-06-03 DIAGNOSIS — I2510 Atherosclerotic heart disease of native coronary artery without angina pectoris: Secondary | ICD-10-CM | POA: Diagnosis not present

## 2023-06-03 DIAGNOSIS — E46 Unspecified protein-calorie malnutrition: Secondary | ICD-10-CM | POA: Diagnosis not present

## 2023-06-03 DIAGNOSIS — R600 Localized edema: Secondary | ICD-10-CM | POA: Diagnosis not present

## 2023-06-03 DIAGNOSIS — K219 Gastro-esophageal reflux disease without esophagitis: Secondary | ICD-10-CM

## 2023-06-03 DIAGNOSIS — I509 Heart failure, unspecified: Secondary | ICD-10-CM | POA: Diagnosis not present

## 2023-06-03 DIAGNOSIS — M150 Primary generalized (osteo)arthritis: Secondary | ICD-10-CM | POA: Diagnosis not present

## 2023-06-03 DIAGNOSIS — E1169 Type 2 diabetes mellitus with other specified complication: Secondary | ICD-10-CM | POA: Diagnosis not present

## 2023-06-03 DIAGNOSIS — E782 Mixed hyperlipidemia: Secondary | ICD-10-CM | POA: Diagnosis not present

## 2023-06-03 DIAGNOSIS — F331 Major depressive disorder, recurrent, moderate: Secondary | ICD-10-CM

## 2023-06-03 LAB — BAYER DCA HB A1C WAIVED: HB A1C (BAYER DCA - WAIVED): 6.4 % — ABNORMAL HIGH (ref 4.8–5.6)

## 2023-06-03 MED ORDER — FUROSEMIDE 20 MG PO TABS: 20.0000 mg | ORAL_TABLET | Freq: Every day | ORAL | 1 refills | Status: AC

## 2023-06-03 NOTE — Patient Instructions (Signed)
Heart Failure, Diagnosis  Heart failure is a condition in which the heart has trouble pumping blood. This may mean that the heart cannot pump enough blood out to the body or that the heart does not fill up with enough blood. For some people with heart failure, fluid may back up into the lungs. There may also be swelling (edema) in the lower legs. Heart failure is usually a long-term (chronic) condition. It is important for you to take good care of yourself and follow the treatment plan from your health care provider. Different stages of heart failure have different treatment plans. The stages are: Stage A: At risk for heart failure. Having no symptoms of heart failure, but being at risk for developing heart failure. Stage B: Pre-heart failure. Having no symptoms of heart failure, but having structural changes to the heart that indicate heart failure. Stage C: Symptomatic heart failure. Having symptoms of heart failure in addition to structural changes to the heart that indicate heart failure. Stage D: Advanced heart failure. Having symptoms that interfere with daily life and frequent hospitalizations related to heart failure. What are the causes? This condition may be caused by: High blood pressure (hypertension). Hypertension causes the heart muscle to work harder than normal. Coronary artery disease, or CAD. CAD is the buildup of cholesterol and fat (plaque) in the arteries of the heart. Heart attack, also called myocardial infarction. This injures the heart muscle, making it hard for the heart to pump blood. Abnormal heart valves. The valves do not open and close properly, forcing the heart to pump harder to keep the blood flowing. Heart muscle disease, inflammation, or infection (cardiomyopathy or myocarditis). This is damage to the heart muscle. It can increase the risk of heart failure. Lung disease. The heart works harder when the lungs are not healthy. What increases the risk? The risk  of heart failure increases as a person ages. This condition is also more likely to develop in people who: Are obese. Use tobacco or nicotine products. Abuse alcohol or drugs. Have taken medicines that can damage the heart, such as chemotherapy drugs. Have any of these conditions: Diabetes. Abnormal heart rhythms. Thyroid problems. Low blood counts (anemia). Chronic kidney disease. Have a family history of heart failure. What are the signs or symptoms? Symptoms of this condition include: Shortness of breath with activity, such as when climbing stairs. A cough that does not go away. Swelling of the feet, ankles, legs, or abdomen. Losing or gaining weight for no reason. Trouble breathing when lying flat. Waking from sleep because of the need to sit up and get more air. Rapid heartbeat. Other symptoms may include: Tiredness (fatigue) and loss of energy. Feeling light-headed, dizzy, or close to fainting. Nausea or loss of appetite. Waking up more often during the night to urinate (nocturia). Confusion. How is this diagnosed? This condition is diagnosed based on: Your medical history, symptoms, and a physical exam. Blood tests. Diagnostic tests, which may include: Echocardiogram. Electrocardiogram (ECG). Chest X-ray. Exercise stress test. Cardiac MRI. Cardiac catheterization and angiogram. Radionuclide scans. How is this treated? Treatment for this condition is aimed at managing the symptoms of heart failure. Medicines Treatment may include medicines that: Help lower blood pressure by relaxing (dilating) the blood vessels. These medicines are called ACE inhibitors (angiotensin-converting enzyme), ARBs (angiotensin receptor blockers), or vasodilators. Cause the kidneys to remove salt and water from the blood through urination (diuretics). Improve heart muscle strength and prevent the heart from beating too fast (beta blockers). Increase the   force of the heartbeat  (digoxin). Lower heart rates. Certain diabetes medicines (SGLT-2 inhibitors) may also be used in treatment. Healthy behavior changes Treatment may also include making healthy lifestyle changes, such as: Reaching and staying at a healthy weight. Not using tobacco or nicotine products. Eating heart-healthy foods. Limiting or avoiding alcohol. Stopping the use of illegal drugs. Being physically active. Participating in a cardiac rehabilitation program, which is a treatment program to improve your health and well-being through exercise training, education, and counseling. Other treatments Other treatments may include: Procedures to open blocked arteries or repair damaged valves. Placing a pacemaker to improve heart function (cardiac resynchronization therapy). Placing a device to treat serious abnormal heart rhythms (implantable cardioverter defibrillator, or ICD). Placing a device to improve the pumping ability of the heart (left ventricular assist device, or LVAD). Receiving a healthy heart from a donor (heart transplant). This is done when other treatments have not helped. Follow these instructions at home: Manage other health conditions as told by your health care provider. These may include hypertension, diabetes, thyroid disease, or abnormal heart rhythms. Get ongoing education and support as needed. Learn as much as you can about heart failure. Keep all follow-up visits. This is important. Where to find more information American Heart Association: www.heart.org Centers for Disease Control and Prevention: www.cdc.gov NIH National Institute on Aging: www.nia.nih.gov Summary Heart failure is a condition in which the heart has trouble pumping blood. This condition is commonly caused by high blood pressure and other diseases of the heart and lungs. Symptoms of this condition include shortness of breath, tiredness (fatigue), nausea, and swelling of the feet, ankles, legs, or  abdomen. Treatments for this condition may include medicines, lifestyle changes, and surgery. Manage other health conditions as told by your health care provider. This information is not intended to replace advice given to you by your health care provider. Make sure you discuss any questions you have with your health care provider. Document Revised: 09/12/2021 Document Reviewed: 12/26/2019 Elsevier Patient Education  2024 Elsevier Inc.  

## 2023-06-03 NOTE — Progress Notes (Signed)
Subjective:    Patient ID: Virginia [name], female    DOB: 01-18-1941, 82 y.o.   MRN: [id number]  Chief Complaint  Patient presents with   Medical Management of Chronic Issues   Knee Pain    LEFT KNEE FLUID DRAWN OFF AT HOSPITAL LAST WEEK   PT presents to the office today for chronic follow up and follow up on weight loss.    She is taking  Remeron 7.5 mg because she had lost 47 lbs and was down to 108 lb since 08/10/22. She gained 25 lb back.  She is drinking Ensure daily. She is taking Lasix 20 mg daily. She was started on lasix for edema and CHF. No edema or SOB at this time.     06/03/2023    8:50 AM 05/28/2023    8:29 AM 02/19/2023    2:24 PM  Last 3 Weights  Weight (lbs) 139 lb 130 lb 124 lb  Weight (kg) 63.05 kg 58.968 kg 56.246 kg    Pt lives at home with her Down Syndrome daughter.    She had seen GI for GI Bleed and was cleared. Continue to avoid NSAID's.    She is followed by Cardiologists annually for A Fib, CAD, and HTN. Continues to take Eliquis. These are stable.  Knee Pain  The incident occurred more than 1 week ago. There was no injury mechanism. The pain is present in the left knee. The pain is at a severity of 8/10. The pain is moderate. The pain has been Intermittent since onset. She reports no foreign bodies present. The symptoms are aggravated by weight bearing. She has tried acetaminophen and non-weight bearing for the symptoms. The treatment provided mild relief.  Hypertension This is a chronic problem. The current episode started more than 1 year ago. The problem has been waxing and waning since onset. The problem is uncontrolled. Associated symptoms include malaise/fatigue and peripheral edema. Pertinent negatives include no blurred vision or shortness of breath. Risk factors for coronary artery disease include dyslipidemia and sedentary lifestyle. The current treatment provides moderate improvement.  Congestive Heart Failure Presents for follow-up visit.  Associated symptoms include edema, fatigue and orthopnea. Pertinent negatives include no shortness of breath. The symptoms have been stable.  Gastroesophageal Reflux She complains of belching and heartburn. This is a chronic problem. The current episode started more than 1 year ago. The problem occurs occasionally. Associated symptoms include fatigue and orthopnea. She has tried a PPI for the symptoms. The treatment provided moderate relief.  Diabetes She presents for her follow-up diabetic visit. She has type 2 diabetes mellitus. Associated symptoms include fatigue. Pertinent negatives for diabetes include no blurred vision and no foot paresthesias. Symptoms are stable. Risk factors for coronary artery disease include dyslipidemia, diabetes mellitus, hypertension, sedentary lifestyle and post-menopausal. She is following a generally healthy diet. (Does not check regularly) Eye exam is not current.  Arthritis Presents for follow-up visit. She complains of pain and stiffness. Affected locations include the right knee, left knee, left shoulder, right shoulder, left MCP and right MCP. Her pain is at a severity of 7/10. Associated symptoms include fatigue.  Depression        This is a chronic problem.  The current episode started more than 1 year ago.   The problem occurs intermittently.  Associated symptoms include fatigue.  Associated symptoms include no helplessness, no hopelessness and not sad.  Past treatments include SSRIs - Selective serotonin reuptake inhibitors. Hyperlipidemia This is a chronic problem.  The current episode started more than 1 year ago. The problem is controlled. Recent lipid tests were reviewed and are normal. Pertinent negatives include no shortness of breath. Current antihyperlipidemic treatment includes statins. The current treatment provides moderate improvement of lipids. Risk factors for coronary artery disease include dyslipidemia, hypertension and a sedentary lifestyle.       Review of Systems  Constitutional:  Positive for fatigue and malaise/fatigue.  Eyes:  Negative for blurred vision.  Respiratory:  Negative for shortness of breath.   Gastrointestinal:  Positive for heartburn.  Musculoskeletal:  Positive for arthritis and stiffness.  Psychiatric/Behavioral:  Positive for depression.   All other systems reviewed and are negative.      Objective:   Physical Exam Vitals reviewed.  Constitutional:      General: She is not in acute distress.    Appearance: She is well-developed.  HENT:     Head: Normocephalic and atraumatic.     Right Ear: Tympanic membrane normal.     Left Ear: Tympanic membrane normal.     Mouth/Throat:     Dentition: Dental tenderness, gingival swelling and dental caries present.  Eyes:     Pupils: Pupils are equal, round, and reactive to light.  Neck:     Thyroid: No thyromegaly.  Cardiovascular:     Rate and Rhythm: Normal rate and regular rhythm.     Heart sounds: Normal heart sounds. No murmur heard. Pulmonary:     Effort: Pulmonary effort is normal. No respiratory distress.     Breath sounds: Normal breath sounds. No wheezing.  Abdominal:     General: Bowel sounds are normal. There is no distension.     Palpations: Abdomen is soft.     Tenderness: There is no abdominal tenderness.  Musculoskeletal:        General: Tenderness (left knee pain with flexion and extension) present.     Cervical back: Normal range of motion and neck supple.     Right lower leg: Edema (2+) present.     Left lower leg: Edema (2+) present.  Skin:    General: Skin is warm and dry.  Neurological:     Mental Status: She is alert and oriented to person, place, and time.     Cranial Nerves: No cranial nerve deficit.     Deep Tendon Reflexes: Reflexes are normal and symmetric.  Psychiatric:        Behavior: Behavior normal.        Thought Content: Thought content normal.        Judgment: Judgment normal.       BP 123/69   Pulse  (!) 57   Temp (!) 97 F (36.1 C) (Temporal)   Resp (!) 96   Ht 5\' 5"  (1.651 m)   Wt 139 lb (63 kg)   BMI 23.13 kg/m      Assessment & Plan:  Virginia [name] comes in today with chief complaint of Medical Management of Chronic Issues and Knee Pain (LEFT KNEE FLUID DRAWN OFF AT HOSPITAL LAST WEEK)   Diagnosis and orders addressed:  1. Coronary artery disease due to lipid rich plaque (Primary) - CMP14+EGFR  2. Gastroesophageal reflux disease, unspecified whether esophagitis present - CMP14+EGFR  3. Mixed hyperlipidemia - CMP14+EGFR - Lipid panel  4. Acute on chronic congestive heart failure, unspecified heart failure type (HCC) - CMP14+EGFR - furosemide (LASIX) 20 MG tablet; Take 1 tablet (20 mg total) by mouth daily.  Dispense: 90 tablet; Refill: 1 - Brain natriuretic  peptide - Compression stockings  5. Moderate episode of recurrent major depressive disorder (HCC) - CMP14+EGFR  6. Primary osteoarthritis involving multiple joints - CMP14+EGFR  7. Primary hypertension - CMP14+EGFR - furosemide (LASIX) 20 MG tablet; Take 1 tablet (20 mg total) by mouth daily.  Dispense: 90 tablet; Refill: 1  8. Protein malnutrition (HCC)  - CMP14+EGFR  9. Type 2 diabetes mellitus with other specified complication, without long-term current use of insulin (HCC) - CMP14+EGFR - Bayer DCA Hb A1c Waived - Microalbumin / creatinine urine ratio  10. Peripheral edema - CMP14+EGFR - furosemide (LASIX) 20 MG tablet; Take 1 tablet (20 mg total) by mouth daily.  Dispense: 90 tablet; Refill: 1 - Compression stockings   Labs pending Continue Lasix 20 mg Start wearing compression hose daily Low salt diet BNP pending, may increase lasix to 40 mg for several days if elevated.  Health Maintenance reviewed Diet and exercise encouraged  Follow up plan: 3 months   Jannifer Rodney, FNP

## 2023-06-04 LAB — CMP14+EGFR
ALT: 24 [IU]/L (ref 0–32)
AST: 28 [IU]/L (ref 0–40)
Albumin: 4.1 g/dL (ref 3.7–4.7)
Alkaline Phosphatase: 150 [IU]/L — ABNORMAL HIGH (ref 44–121)
BUN/Creatinine Ratio: 22 (ref 12–28)
BUN: 16 mg/dL (ref 8–27)
Bilirubin Total: 0.8 mg/dL (ref 0.0–1.2)
CO2: 26 mmol/L (ref 20–29)
Calcium: 10 mg/dL (ref 8.7–10.3)
Chloride: 99 mmol/L (ref 96–106)
Creatinine, Ser: 0.73 mg/dL (ref 0.57–1.00)
Globulin, Total: 2.5 g/dL (ref 1.5–4.5)
Glucose: 181 mg/dL — ABNORMAL HIGH (ref 70–99)
Potassium: 3.6 mmol/L (ref 3.5–5.2)
Sodium: 142 mmol/L (ref 134–144)
Total Protein: 6.6 g/dL (ref 6.0–8.5)
eGFR: 82 mL/min/{1.73_m2} (ref >59)

## 2023-06-04 LAB — LIPID PANEL
Chol/HDL Ratio: 2.2 {ratio} (ref 0.0–4.4)
Cholesterol, Total: 148 mg/dL (ref 100–199)
HDL: 68 mg/dL (ref >39)
LDL Chol Calc (NIH): 62 mg/dL (ref 0–99)
Triglycerides: 98 mg/dL (ref 0–149)
VLDL Cholesterol Cal: 18 mg/dL (ref 5–40)

## 2023-06-04 LAB — MICROALBUMIN / CREATININE URINE RATIO
Creatinine, Urine: 10.9 mg/dL
Microalb/Creat Ratio: <28 mg/g{creat} (ref 0–29)
Microalbumin, Urine: <3 ug/mL

## 2023-06-04 LAB — BRAIN NATRIURETIC PEPTIDE: BNP: 420.1 pg/mL — ABNORMAL HIGH (ref 0.0–100.0)

## 2023-06-06 ENCOUNTER — Other Ambulatory Visit: Payer: Self-pay | Admitting: Family

## 2023-06-20 ENCOUNTER — Other Ambulatory Visit: Payer: Self-pay | Admitting: Family

## 2023-06-20 DIAGNOSIS — F331 Major depressive disorder, recurrent, moderate: Secondary | ICD-10-CM

## 2023-06-21 ENCOUNTER — Other Ambulatory Visit: Payer: Self-pay | Admitting: Family

## 2023-06-26 ENCOUNTER — Encounter: Payer: Self-pay | Admitting: Orthopaedic Surgery

## 2023-06-26 ENCOUNTER — Ambulatory Visit: Payer: Medicare Other | Admitting: Orthopaedic Surgery

## 2023-06-26 VITALS — Ht 64.0 in | Wt 135.0 lb

## 2023-06-26 DIAGNOSIS — M25562 Pain in left knee: Secondary | ICD-10-CM | POA: Diagnosis not present

## 2023-06-26 DIAGNOSIS — G8929 Other chronic pain: Secondary | ICD-10-CM

## 2023-06-26 NOTE — Progress Notes (Signed)
 PROCEDURE NOTE:  The patient request injection, verbal consent was obtained.  The left knee was prepped appropriately after time out was performed.   Sterile technique was observed and anesthesia was provided by ethyl chloride and a 20-gauge needle was used to inject the knee area.  A 16-gauge needle was then used to aspirate the knee.  Color of fluid aspirated was straw  Total cc's aspirated was 20.    Injection of 1 cc of DepoMedrol 40 with several cc's of plain xylocaine  was then performed.  A band aid dressing was applied.  The patient was advised to apply ice later today and tomorrow to the injection sight as needed.   Encounter Diagnosis  Name Primary?   Chronic pain of left knee Yes   Return in one month.  Call if any problem.  Precautions discussed.  Electronically Signed Lemond Stable, MD 1/8/20258:56 AM

## 2023-07-14 ENCOUNTER — Other Ambulatory Visit: Payer: Self-pay | Admitting: Family

## 2023-07-14 DIAGNOSIS — E46 Unspecified protein-calorie malnutrition: Secondary | ICD-10-CM

## 2023-07-15 ENCOUNTER — Ambulatory Visit: Payer: Medicare Other | Admitting: Family

## 2023-07-15 ENCOUNTER — Encounter (HOSPITAL_COMMUNITY): Payer: Self-pay

## 2023-07-15 ENCOUNTER — Other Ambulatory Visit: Payer: Medicare Other

## 2023-07-15 ENCOUNTER — Inpatient Hospital Stay (HOSPITAL_COMMUNITY)
Admission: EM | Admit: 2023-07-15 | Discharge: 2023-07-22 | DRG: 522 | Disposition: A | Discharge status: Skilled Nursing Facility | Payer: Medicare Other | Attending: Internal Medicine | Admitting: Internal Medicine

## 2023-07-15 ENCOUNTER — Other Ambulatory Visit: Payer: Self-pay

## 2023-07-15 ENCOUNTER — Emergency Department (HOSPITAL_COMMUNITY): Payer: Medicare Other

## 2023-07-15 DIAGNOSIS — I110 Hypertensive heart disease with heart failure: Secondary | ICD-10-CM | POA: Diagnosis not present

## 2023-07-15 DIAGNOSIS — I701 Atherosclerosis of renal artery: Secondary | ICD-10-CM | POA: Diagnosis not present

## 2023-07-15 DIAGNOSIS — W19XXXA Unspecified fall, initial encounter: Secondary | ICD-10-CM | POA: Diagnosis present

## 2023-07-15 DIAGNOSIS — I2510 Atherosclerotic heart disease of native coronary artery without angina pectoris: Secondary | ICD-10-CM | POA: Diagnosis not present

## 2023-07-15 DIAGNOSIS — R296 Repeated falls: Secondary | ICD-10-CM | POA: Diagnosis not present

## 2023-07-15 DIAGNOSIS — Y92009 Unspecified place in unspecified non-institutional (private) residence as the place of occurrence of the external cause: Secondary | ICD-10-CM

## 2023-07-15 DIAGNOSIS — I509 Heart failure, unspecified: Secondary | ICD-10-CM | POA: Diagnosis not present

## 2023-07-15 DIAGNOSIS — Z79899 Other long term (current) drug therapy: Secondary | ICD-10-CM | POA: Diagnosis not present

## 2023-07-15 DIAGNOSIS — E1169 Type 2 diabetes mellitus with other specified complication: Secondary | ICD-10-CM | POA: Diagnosis not present

## 2023-07-15 DIAGNOSIS — D509 Iron deficiency anemia, unspecified: Secondary | ICD-10-CM | POA: Diagnosis present

## 2023-07-15 DIAGNOSIS — E119 Type 2 diabetes mellitus without complications: Secondary | ICD-10-CM | POA: Diagnosis not present

## 2023-07-15 DIAGNOSIS — I517 Cardiomegaly: Secondary | ICD-10-CM | POA: Diagnosis not present

## 2023-07-15 DIAGNOSIS — Z7984 Long term (current) use of oral hypoglycemic drugs: Secondary | ICD-10-CM | POA: Diagnosis not present

## 2023-07-15 DIAGNOSIS — Z951 Presence of aortocoronary bypass graft: Secondary | ICD-10-CM | POA: Diagnosis not present

## 2023-07-15 DIAGNOSIS — K5730 Diverticulosis of large intestine without perforation or abscess without bleeding: Secondary | ICD-10-CM | POA: Diagnosis not present

## 2023-07-15 DIAGNOSIS — M1712 Unilateral primary osteoarthritis, left knee: Secondary | ICD-10-CM

## 2023-07-15 DIAGNOSIS — N390 Urinary tract infection, site not specified: Secondary | ICD-10-CM | POA: Diagnosis not present

## 2023-07-15 DIAGNOSIS — R6889 Other general symptoms and signs: Secondary | ICD-10-CM | POA: Diagnosis not present

## 2023-07-15 DIAGNOSIS — Z96651 Presence of right artificial knee joint: Secondary | ICD-10-CM | POA: Diagnosis not present

## 2023-07-15 DIAGNOSIS — E785 Hyperlipidemia, unspecified: Secondary | ICD-10-CM | POA: Diagnosis not present

## 2023-07-15 DIAGNOSIS — K219 Gastro-esophageal reflux disease without esophagitis: Secondary | ICD-10-CM | POA: Diagnosis present

## 2023-07-15 DIAGNOSIS — F32A Depression, unspecified: Secondary | ICD-10-CM | POA: Diagnosis present

## 2023-07-15 DIAGNOSIS — R609 Edema, unspecified: Secondary | ICD-10-CM | POA: Diagnosis not present

## 2023-07-15 DIAGNOSIS — M79652 Pain in left thigh: Secondary | ICD-10-CM | POA: Diagnosis not present

## 2023-07-15 DIAGNOSIS — Z8249 Family history of ischemic heart disease and other diseases of the circulatory system: Secondary | ICD-10-CM | POA: Diagnosis not present

## 2023-07-15 DIAGNOSIS — R531 Weakness: Secondary | ICD-10-CM | POA: Diagnosis not present

## 2023-07-15 DIAGNOSIS — F419 Anxiety disorder, unspecified: Secondary | ICD-10-CM | POA: Diagnosis present

## 2023-07-15 DIAGNOSIS — I482 Chronic atrial fibrillation, unspecified: Secondary | ICD-10-CM | POA: Diagnosis not present

## 2023-07-15 DIAGNOSIS — S72002A Fracture of unspecified part of neck of left femur, initial encounter for closed fracture: Secondary | ICD-10-CM | POA: Diagnosis not present

## 2023-07-15 DIAGNOSIS — I10 Essential (primary) hypertension: Secondary | ICD-10-CM | POA: Diagnosis not present

## 2023-07-15 DIAGNOSIS — Z823 Family history of stroke: Secondary | ICD-10-CM | POA: Diagnosis not present

## 2023-07-15 DIAGNOSIS — Z7901 Long term (current) use of anticoagulants: Secondary | ICD-10-CM | POA: Diagnosis not present

## 2023-07-15 DIAGNOSIS — G250 Essential tremor: Secondary | ICD-10-CM | POA: Diagnosis present

## 2023-07-15 DIAGNOSIS — R627 Adult failure to thrive: Secondary | ICD-10-CM | POA: Diagnosis not present

## 2023-07-15 DIAGNOSIS — M25562 Pain in left knee: Secondary | ICD-10-CM | POA: Diagnosis not present

## 2023-07-15 DIAGNOSIS — M1612 Unilateral primary osteoarthritis, left hip: Secondary | ICD-10-CM | POA: Diagnosis not present

## 2023-07-15 DIAGNOSIS — R600 Localized edema: Secondary | ICD-10-CM | POA: Diagnosis not present

## 2023-07-15 DIAGNOSIS — B9620 Unspecified Escherichia coli [E. coli] as the cause of diseases classified elsewhere: Secondary | ICD-10-CM | POA: Diagnosis present

## 2023-07-15 DIAGNOSIS — Z743 Need for continuous supervision: Secondary | ICD-10-CM | POA: Diagnosis not present

## 2023-07-15 LAB — BASIC METABOLIC PANEL
Anion gap: 11 (ref 5–15)
BUN: 16 mg/dL (ref 8–23)
CO2: 28 mmol/L (ref 22–32)
Calcium: 10 mg/dL (ref 8.9–10.3)
Chloride: 100 mmol/L (ref 98–111)
Creatinine, Ser: 0.67 mg/dL (ref 0.44–1.00)
GFR, Estimated: >60 mL/min (ref >60)
Glucose, Bld: 168 mg/dL — ABNORMAL HIGH (ref 70–99)
Potassium: 4 mmol/L (ref 3.5–5.1)
Sodium: 139 mmol/L (ref 135–145)

## 2023-07-15 LAB — CBC WITH DIFFERENTIAL/PLATELET
Abs Immature Granulocytes: 0.03 10*3/uL (ref 0.00–0.07)
Basophils Absolute: 0 10*3/uL (ref 0.0–0.1)
Basophils Relative: 0 %
Eosinophils Absolute: 0.2 10*3/uL (ref 0.0–0.5)
Eosinophils Relative: 2 %
HCT: 46.8 % — ABNORMAL HIGH (ref 36.0–46.0)
Hemoglobin: 15.8 g/dL — ABNORMAL HIGH (ref 12.0–15.0)
Immature Granulocytes: 0 %
Lymphocytes Relative: 15 %
Lymphs Abs: 1.2 10*3/uL (ref 0.7–4.0)
MCH: 32.8 pg (ref 26.0–34.0)
MCHC: 33.8 g/dL (ref 30.0–36.0)
MCV: 97.3 fL (ref 80.0–100.0)
Monocytes Absolute: 0.7 10*3/uL (ref 0.1–1.0)
Monocytes Relative: 9 %
Neutro Abs: 5.6 10*3/uL (ref 1.7–7.7)
Neutrophils Relative %: 74 %
Platelets: 143 10*3/uL — ABNORMAL LOW (ref 150–400)
RBC: 4.81 MIL/uL (ref 3.87–5.11)
RDW: 12.9 % (ref 11.5–15.5)
WBC: 7.7 10*3/uL (ref 4.0–10.5)
nRBC: 0 % (ref 0.0–0.2)

## 2023-07-15 LAB — URINALYSIS, ROUTINE W REFLEX MICROSCOPIC
Bilirubin Urine: NEGATIVE
Glucose, UA: >=500 mg/dL — AB
Ketones, ur: 5 mg/dL — AB
Nitrite: NEGATIVE
Protein, ur: NEGATIVE mg/dL
Specific Gravity, Urine: 1.031 — ABNORMAL HIGH (ref 1.005–1.030)
pH: 5 (ref 5.0–8.0)

## 2023-07-15 NOTE — ED Provider Notes (Signed)
Signout from [employer], PA-C at shift change. Briefly, patient is an 83 year old female with dementia who presents for multiple falls in the past week.  She has not hit her head or lost consciousness.  She has had generalized failure to thrive, increased weakness and severe pain in the left knee with known advanced OA.  She is accompanied by her family who reports concerns of her returning home.  Please review Raynelle Fanning PA-C's note for further details     12:15 AM Reassessment performed. Patient appears comfortable, she is hemodynamically stable, she has chronic deformity to left knee with advanced OA.  She does have tenderness to left hip as well.  She has no neurodeficits on exam she is.  She has no chest pain shortness of breath abdominal pain vomiting or diarrhea.  No cough.  No urinary symptoms.  Labs and imaging personally reviewed and interpreted including: BMP and CBC without significant abnormality, UA with elevated specific gravity, small amount of hemoglobin, trace leukocytes, rare bacteria, no nitrates  Left knee x-ray with significant tricompartment OA, CT left hip with acute impacted fracture of left hip  Offered patient something for pain, however she declines at this time.     Most current vital signs reviewed and are as follows: BP 134/73   Pulse 86   Temp 98.2 F (36.8 C) (Oral)   Resp 17   Ht 5\' 4"  (1.626 m)   Wt 61.2 kg   SpO2 91%   BMI 23.16 kg/m     Plan: Plan to admit patient for left hip fracture and failure to thrive Consult hospitalist Dr. Thomes Dinning agreed to accept patient for failure to thrive and impacted left femoral neck fracture.  She does have an equivocal UA without any urinary symptoms, however given recent falls and failure to thrive would still treat empirically for asymptomatic UTI. Consult Ortho Dr Romeo Apple, agreed to see her with plan to admit to hospitalist service    Halford Decamp, PA-C 07/16/23 0015    Gloris Manchester, MD 07/17/23 [phone]

## 2023-07-15 NOTE — ED Provider Notes (Incomplete)
Signout from [employer], PA-C at shift change. Briefly, patient is an 83 year old female with dementia who presents for multiple falls in the past week.  She has not hit her head or lost consciousness.  She has had generalized failure to thrive, increased weakness and severe pain in the left knee with known advanced OA.  She is accompanied by her family who reports concerns of her returning home.  Please review Raynelle Fanning PA-C's note for further details     11:41 PM Reassessment performed. Patient appears comfortable, she is hemodynamically stable, she has chronic deformity to left knee with advanced OA.  She does have tenderness to left hip as well.  She has no neurodeficits on exam she is.  She has no chest pain shortness of breath abdominal pain vomiting or diarrhea.  No cough.  No urinary symptoms.  Labs and imaging personally reviewed and interpreted including: BMP and CBC without significant abnormality, UA with elevated specific gravity, small amount of hemoglobin, trace leukocytes, rare bacteria, no nitrates  Left knee x-ray with significant tricompartment OA, CT left hip with acute impacted fracture of left hip  Patient something for pain, however she declines at this time.     Most current vital signs reviewed and are as follows: BP (!) 156/80   Pulse 97   Temp 98.2 F (36.8 C) (Oral)   Resp 15   Ht 5\' 4"  (1.626 m)   Wt 61.2 kg   SpO2 91%   BMI 23.16 kg/m     Plan: Plan to admit patient for left hip fracture and failure to thrive Consult hospitalist Dr. Thomes Dinning

## 2023-07-15 NOTE — ED Notes (Signed)
Patient transported to CT

## 2023-07-15 NOTE — ED Triage Notes (Signed)
Pt arrived via [facility] from home c/o left knee pain and swelling for a month. Pt reports she has been compliant with her medications at home. Pt reports she is wheelchair bound for the past 6 months. Pt reports multiple recent falls.

## 2023-07-15 NOTE — ED Provider Notes (Signed)
[place] EMERGENCY DEPARTMENT AT [facility] Provider Note   CSN: [id number] Arrival date & time: 07/15/23  1724     History  Chief Complaint  Patient presents with   Knee Pain    Virginia [name] is a 83 y.o. female with history including CAD, hypertension, type 2 diabetes, history of chronic atrial fibrillation, GERD, severe osteoarthritis in her left knee and history of CHF presenting for evaluation of multiple falls, increasing weakness and severe pain in her left knee.  She lives with her son and a disabled daughter, and is having great difficulty caring for herself in her home.  The son at the bedside who is also a long-distance truck driver states that she has difficulty getting out of bed, out of her recliner chair or even out of the home and has difficulty with her ADLs.  She concurs that a great deal over problem is the severe pain in her left knee, but she also has generalized weakness.  She has had 3 falls in the past 3 weeks, most recently yesterday morning, she was trying to get water at the kitchen sink, she fell but is unsure of the reason for the fall, she denies LOC.  Her son was not home at the time, she had a crawl into the next room in order to get to her phone but was unable to get off the floor.  Since then she has had increased pain in the left knee but also in the left hip making it impossible to weight-bear on that side.  She has seen Dr. Hilda Lias for orthopedic issues,  had right knee arthroplasty years ago and wants her left knee fixed as well.  Son is requesting patient be evaluated for rehab or some sort of placement as she is unsafe in their home at this time.  The history is provided by the patient and a relative (son at bedside, primary caregiver).       Home Medications Prior to Admission medications   Medication Sig Start Date End Date Taking? Authorizing Provider  acetaminophen (TYLENOL) 500 MG tablet Take 500-1,000 mg by mouth every 6 (six)  hours as needed (for pain.).    [provider]  apixaban (ELIQUIS) 5 MG TABS tablet Take 1 tablet (5 mg total) by mouth 2 (two) times daily. 12/18/22   Jannifer Rodney A, FNP  ascorbic acid (VITAMIN C) 500 MG tablet Take 500 mg by mouth daily.    [provider]  atorvastatin (LIPITOR) 80 MG tablet TAKE 1 TABLET BY MOUTH DAILY 06/21/23   Jannifer Rodney A, FNP  calcium-vitamin D (OSCAL WITH D) 500-5 MG-MCG tablet Take 1 tablet by mouth.    [provider]  cetirizine (ZYRTEC) 10 MG tablet TAKE ONE (1) TABLET BY MOUTH EVERY DAY 03/04/23   Jannifer Rodney A, FNP  dapagliflozin propanediol (FARXIGA) 10 MG TABS tablet TAKE 1 TABLET BY MOUTH DAILY  BEFORE BREAKFAST 02/25/23   Hawks, Neysa Bonito A, FNP  feeding supplement (ENSURE ENLIVE / ENSURE PLUS) LIQD Take 237 mLs by mouth 3 (three) times daily between meals. 12/04/22   Jannifer Rodney A, FNP  ferrous sulfate 325 (65 FE) MG tablet Take 650 mg by mouth at bedtime.    [provider]  furosemide (LASIX) 20 MG tablet Take 1 tablet (20 mg total) by mouth daily. 06/03/23   Jannifer Rodney A, FNP  metoprolol succinate (TOPROL-XL) 50 MG 24 hr tablet TAKE 1 AND 1/2 TABLETS BY MOUTH  DAILY TAKE WITH  OR IMMEDIATELY  FOLLOWING A MEAL 05/29/23   Jannifer Rodney A, FNP  mirtazapine (REMERON) 7.5 MG tablet TAKE 1 TABLET BY MOUTH AT  BEDTIME 07/15/23   Hawks, Neysa Bonito A, FNP  nystatin cream (MYCOSTATIN) Apply 1 Application topically 2 (two) times daily. 06/05/22   Jannifer Rodney A, FNP  pantoprazole (PROTONIX) 40 MG tablet Take 1 tablet (40 mg total) by mouth daily. 12/18/22   Jannifer Rodney A, FNP  PARoxetine (PAXIL) 10 MG tablet TAKE 1 TABLET BY MOUTH EVERY  MORNING 06/21/23   Junie Spencer, FNP      Allergies    Patient has no known allergies.    Review of Systems   Review of Systems  Constitutional:  Positive for appetite change. Negative for fever.       Reports poor appetite  HENT:  Negative for congestion and sore throat.   Eyes:  Negative.   Respiratory:  Negative for chest tightness and shortness of breath.   Cardiovascular:  Negative for chest pain and leg swelling.  Gastrointestinal:  Negative for abdominal pain, nausea and vomiting.  Endocrine: Positive for polydipsia. Negative for polyuria.  Genitourinary: Negative.   Musculoskeletal:  Positive for arthralgias and joint swelling. Negative for neck pain.  Skin: Negative.  Negative for rash.  Neurological:  Positive for weakness. Negative for dizziness, light-headedness, numbness and headaches.  Psychiatric/Behavioral: Negative.      Physical Exam Updated Vital Signs BP (!) 136/102 (BP Location: Right Arm)   Pulse 95   Temp 98.2 F (36.8 C) (Oral)   Resp 16   Ht 5\' 4"  (1.626 m)   Wt 61.2 kg   SpO2 96%   BMI 23.16 kg/m  Physical Exam Vitals and nursing note reviewed.  Constitutional:      Appearance: She is well-developed.  HENT:     Head: Normocephalic and atraumatic.  Eyes:     Conjunctiva/sclera: Conjunctivae normal.     Comments: Pale conjunctiva  Cardiovascular:     Rate and Rhythm: Normal rate and regular rhythm.     Heart sounds: Normal heart sounds.  Pulmonary:     Effort: Pulmonary effort is normal.     Breath sounds: Normal breath sounds. No wheezing.  Abdominal:     General: Bowel sounds are normal.     Palpations: Abdomen is soft.     Tenderness: There is no abdominal tenderness.  Musculoskeletal:        General: Tenderness present.     Cervical back: Normal range of motion.     Left hip: Tenderness present. No deformity.     Comments: Tenderness left medial groin.  Tender to palpation left knee with significant bony deformity noted.  No effusion.  Skin:    General: Skin is warm and dry.  Neurological:     Mental Status: She is alert.  Psychiatric:        Mood and Affect: Mood is depressed.        Behavior: Behavior normal. Behavior is cooperative.        Thought Content: Thought content normal.     ED Results /  Procedures / Treatments   Labs (all labs ordered are listed, but only abnormal results are displayed) Labs Reviewed  CBC WITH DIFFERENTIAL/PLATELET - Abnormal; Notable for the following components:      Result Value   Hemoglobin 15.8 (*)    HCT 46.8 (*)    Platelets 143 (*)    All other components within normal limits  URINALYSIS, ROUTINE W  REFLEX MICROSCOPIC - Abnormal; Notable for the following components:   APPearance HAZY (*)    Specific Gravity, Urine 1.031 (*)    Glucose, UA >=500 (*)    Hgb urine dipstick SMALL (*)    Ketones, ur 5 (*)    Leukocytes,Ua TRACE (*)    Bacteria, UA RARE (*)    All other components within normal limits  BASIC METABOLIC PANEL    EKG None  Radiology DG Knee 2 Views Left Result Date: 07/15/2023 CLINICAL DATA:  Pain after fall, injury. EXAM: LEFT KNEE - 1-2 VIEW; LEFT FEMUR 2 VIEWS COMPARISON:  Knee radiograph 11/30/2021 FINDINGS: Knee: No acute fracture or dislocation. Moderate to advanced tricompartmental osteoarthritis. Suspected ossified intra-articular body posteriorly. Chronic calcifications anteriorly in the region of the quadriceps tendon. No large joint effusion. There is mild anterior soft tissue edema. Femur: No acute fracture. Hip alignment is maintained. No focal bone abnormalities. Vascular calcifications are seen. IMPRESSION: 1. No acute fracture or dislocation of the left knee or femur. 2. Moderate to advanced tricompartmental osteoarthritis of the knee. Electronically Signed   By: Narda Rutherford M.D.   On: 07/15/2023 19:56   DG FEMUR MIN 2 VIEWS LEFT Result Date: 07/15/2023 CLINICAL DATA:  Pain after fall, injury. EXAM: LEFT KNEE - 1-2 VIEW; LEFT FEMUR 2 VIEWS COMPARISON:  Knee radiograph 11/30/2021 FINDINGS: Knee: No acute fracture or dislocation. Moderate to advanced tricompartmental osteoarthritis. Suspected ossified intra-articular body posteriorly. Chronic calcifications anteriorly in the region of the quadriceps tendon. No large  joint effusion. There is mild anterior soft tissue edema. Femur: No acute fracture. Hip alignment is maintained. No focal bone abnormalities. Vascular calcifications are seen. IMPRESSION: 1. No acute fracture or dislocation of the left knee or femur. 2. Moderate to advanced tricompartmental osteoarthritis of the knee. Electronically Signed   By: Narda Rutherford M.D.   On: 07/15/2023 19:56    Procedures Procedures    Medications Ordered in ED Medications - No data to display  ED Course/ Medical Decision Making/ A&P                                 Medical Decision Making Patient presenting with a generalized failure to thrive, increased weakness, poor appetite, multiple falls, severe pain in the left knee with advanced osteoarthritis.  Is unclear the nature of these falls whether it is weakness, orthopedic pain or combination of both.  X-rays of her left knee and left femur are negative for obvious acute fractures, given degree of pain and her inability to weight-bear after yesterday's fall, will send her back for CT of her hip and pelvis to rule out occult fracture.  Labs have also been ordered looking for a reason for weakness and/or need for admission.  If unable to find a reason to admit this patient, she may need to be boarded here for TOC and PT eval in the morning for consideration of rehab/SNF placement.  Patient signed out to [facility], [place].  Amount and/or Complexity of Data Reviewed Labs: ordered. Radiology: ordered.           Final Clinical Impression(s) / ED Diagnoses Final diagnoses:  Frequent falls  Primary osteoarthritis of left knee  Weakness    Rx / DC Orders ED Discharge Orders     None         Victoriano Lain 07/15/23 2235    Gloris Manchester, MD 07/15/23 2330

## 2023-07-16 DIAGNOSIS — I509 Heart failure, unspecified: Secondary | ICD-10-CM | POA: Diagnosis present

## 2023-07-16 DIAGNOSIS — I480 Paroxysmal atrial fibrillation: Secondary | ICD-10-CM | POA: Diagnosis not present

## 2023-07-16 DIAGNOSIS — Z8249 Family history of ischemic heart disease and other diseases of the circulatory system: Secondary | ICD-10-CM | POA: Diagnosis not present

## 2023-07-16 DIAGNOSIS — I10 Essential (primary) hypertension: Secondary | ICD-10-CM | POA: Diagnosis not present

## 2023-07-16 DIAGNOSIS — D649 Anemia, unspecified: Secondary | ICD-10-CM | POA: Diagnosis not present

## 2023-07-16 DIAGNOSIS — K219 Gastro-esophageal reflux disease without esophagitis: Secondary | ICD-10-CM | POA: Diagnosis not present

## 2023-07-16 DIAGNOSIS — Z96642 Presence of left artificial hip joint: Secondary | ICD-10-CM | POA: Diagnosis not present

## 2023-07-16 DIAGNOSIS — I701 Atherosclerosis of renal artery: Secondary | ICD-10-CM | POA: Diagnosis present

## 2023-07-16 DIAGNOSIS — N390 Urinary tract infection, site not specified: Secondary | ICD-10-CM | POA: Diagnosis not present

## 2023-07-16 DIAGNOSIS — R2689 Other abnormalities of gait and mobility: Secondary | ICD-10-CM | POA: Diagnosis not present

## 2023-07-16 DIAGNOSIS — S72002D Fracture of unspecified part of neck of left femur, subsequent encounter for closed fracture with routine healing: Secondary | ICD-10-CM | POA: Diagnosis not present

## 2023-07-16 DIAGNOSIS — Z7984 Long term (current) use of oral hypoglycemic drugs: Secondary | ICD-10-CM | POA: Diagnosis not present

## 2023-07-16 DIAGNOSIS — I482 Chronic atrial fibrillation, unspecified: Secondary | ICD-10-CM | POA: Diagnosis not present

## 2023-07-16 DIAGNOSIS — M1712 Unilateral primary osteoarthritis, left knee: Secondary | ICD-10-CM | POA: Diagnosis not present

## 2023-07-16 DIAGNOSIS — Z951 Presence of aortocoronary bypass graft: Secondary | ICD-10-CM | POA: Diagnosis not present

## 2023-07-16 DIAGNOSIS — W19XXXA Unspecified fall, initial encounter: Secondary | ICD-10-CM | POA: Diagnosis present

## 2023-07-16 DIAGNOSIS — F32A Depression, unspecified: Secondary | ICD-10-CM | POA: Diagnosis present

## 2023-07-16 DIAGNOSIS — Z96651 Presence of right artificial knee joint: Secondary | ICD-10-CM | POA: Diagnosis present

## 2023-07-16 DIAGNOSIS — R296 Repeated falls: Secondary | ICD-10-CM | POA: Diagnosis not present

## 2023-07-16 DIAGNOSIS — S72042A Displaced fracture of base of neck of left femur, initial encounter for closed fracture: Secondary | ICD-10-CM | POA: Diagnosis not present

## 2023-07-16 DIAGNOSIS — E1169 Type 2 diabetes mellitus with other specified complication: Secondary | ICD-10-CM | POA: Diagnosis not present

## 2023-07-16 DIAGNOSIS — Z8719 Personal history of other diseases of the digestive system: Secondary | ICD-10-CM | POA: Diagnosis not present

## 2023-07-16 DIAGNOSIS — S72002A Fracture of unspecified part of neck of left femur, initial encounter for closed fracture: Secondary | ICD-10-CM | POA: Diagnosis present

## 2023-07-16 DIAGNOSIS — B9620 Unspecified Escherichia coli [E. coli] as the cause of diseases classified elsewhere: Secondary | ICD-10-CM | POA: Diagnosis present

## 2023-07-16 DIAGNOSIS — I2510 Atherosclerotic heart disease of native coronary artery without angina pectoris: Secondary | ICD-10-CM | POA: Diagnosis not present

## 2023-07-16 DIAGNOSIS — M62561 Muscle wasting and atrophy, not elsewhere classified, right lower leg: Secondary | ICD-10-CM | POA: Diagnosis not present

## 2023-07-16 DIAGNOSIS — E785 Hyperlipidemia, unspecified: Secondary | ICD-10-CM | POA: Diagnosis not present

## 2023-07-16 DIAGNOSIS — Z79899 Other long term (current) drug therapy: Secondary | ICD-10-CM | POA: Diagnosis not present

## 2023-07-16 DIAGNOSIS — Y92009 Unspecified place in unspecified non-institutional (private) residence as the place of occurrence of the external cause: Secondary | ICD-10-CM | POA: Diagnosis not present

## 2023-07-16 DIAGNOSIS — F419 Anxiety disorder, unspecified: Secondary | ICD-10-CM | POA: Diagnosis present

## 2023-07-16 DIAGNOSIS — G250 Essential tremor: Secondary | ICD-10-CM | POA: Diagnosis not present

## 2023-07-16 DIAGNOSIS — M62562 Muscle wasting and atrophy, not elsewhere classified, left lower leg: Secondary | ICD-10-CM | POA: Diagnosis not present

## 2023-07-16 DIAGNOSIS — D509 Iron deficiency anemia, unspecified: Secondary | ICD-10-CM | POA: Diagnosis present

## 2023-07-16 DIAGNOSIS — I110 Hypertensive heart disease with heart failure: Secondary | ICD-10-CM | POA: Diagnosis present

## 2023-07-16 DIAGNOSIS — Z7901 Long term (current) use of anticoagulants: Secondary | ICD-10-CM | POA: Diagnosis not present

## 2023-07-16 DIAGNOSIS — R627 Adult failure to thrive: Secondary | ICD-10-CM | POA: Diagnosis not present

## 2023-07-16 DIAGNOSIS — R531 Weakness: Secondary | ICD-10-CM | POA: Diagnosis not present

## 2023-07-16 DIAGNOSIS — E119 Type 2 diabetes mellitus without complications: Secondary | ICD-10-CM | POA: Diagnosis not present

## 2023-07-16 DIAGNOSIS — Z823 Family history of stroke: Secondary | ICD-10-CM | POA: Diagnosis not present

## 2023-07-16 LAB — TYPE AND SCREEN
ABO/RH(D): B POS
Antibody Screen: NEGATIVE

## 2023-07-16 LAB — GLUCOSE, CAPILLARY
Glucose-Capillary: 146 mg/dL — ABNORMAL HIGH (ref 70–99)
Glucose-Capillary: 160 mg/dL — ABNORMAL HIGH (ref 70–99)
Glucose-Capillary: 193 mg/dL — ABNORMAL HIGH (ref 70–99)

## 2023-07-16 LAB — MRSA NEXT GEN BY PCR, NASAL: MRSA by PCR Next Gen: NOT DETECTED

## 2023-07-16 MED ORDER — POLYETHYLENE GLYCOL 3350 17 G PO PACK: 17.0000 g | PACK | Freq: Every day | ORAL | Status: DC | PRN

## 2023-07-16 MED ORDER — SODIUM CHLORIDE 0.9% FLUSH
3.0000 mL | Freq: Two times a day (BID) | INTRAVENOUS | Status: DC
  Administered 2023-07-17 – 2023-07-22 (×6): 3 mL via INTRAVENOUS

## 2023-07-16 MED ORDER — POVIDONE-IODINE 10 % EX SWAB: 2.0000 | Freq: Once | CUTANEOUS | Status: DC

## 2023-07-16 MED ORDER — SODIUM CHLORIDE 0.9% FLUSH
3.0000 mL | Freq: Two times a day (BID) | INTRAVENOUS | Status: DC
  Administered 2023-07-17 – 2023-07-22 (×8): 3 mL via INTRAVENOUS

## 2023-07-16 MED ORDER — ACETAMINOPHEN 325 MG PO TABS
650.0000 mg | ORAL_TABLET | Freq: Four times a day (QID) | ORAL | Status: DC | PRN
  Administered 2023-07-16 – 2023-07-22 (×2): 650 mg via ORAL
  Filled 2023-07-16 (×2): qty 2

## 2023-07-16 MED ORDER — ONDANSETRON HCL 4 MG/2ML IJ SOLN: 4.0000 mg | Freq: Four times a day (QID) | INTRAMUSCULAR | Status: DC | PRN

## 2023-07-16 MED ORDER — ACETAMINOPHEN 650 MG RE SUPP: 650.0000 mg | Freq: Four times a day (QID) | RECTAL | Status: DC | PRN

## 2023-07-16 MED ORDER — FUROSEMIDE 20 MG PO TABS
20.0000 mg | ORAL_TABLET | Freq: Every day | ORAL | Status: DC
  Administered 2023-07-18 – 2023-07-22 (×5): 20 mg via ORAL
  Filled 2023-07-16 (×5): qty 1

## 2023-07-16 MED ORDER — SODIUM CHLORIDE 0.9 % IV SOLN: INTRAVENOUS | Status: AC | PRN

## 2023-07-16 MED ORDER — METHOCARBAMOL 500 MG PO TABS
500.0000 mg | ORAL_TABLET | Freq: Three times a day (TID) | ORAL | Status: AC
  Administered 2023-07-16 – 2023-07-18 (×6): 500 mg via ORAL
  Filled 2023-07-16 (×6): qty 1

## 2023-07-16 MED ORDER — LABETALOL HCL 5 MG/ML IV SOLN: 10.0000 mg | INTRAVENOUS | Status: DC | PRN

## 2023-07-16 MED ORDER — BISACODYL 10 MG RE SUPP: 10.0000 mg | Freq: Every day | RECTAL | Status: DC | PRN

## 2023-07-16 MED ORDER — ATORVASTATIN CALCIUM 40 MG PO TABS
80.0000 mg | ORAL_TABLET | Freq: Every day | ORAL | Status: DC
  Administered 2023-07-16 – 2023-07-22 (×6): 80 mg via ORAL
  Filled 2023-07-16 (×6): qty 2

## 2023-07-16 MED ORDER — SODIUM CHLORIDE 0.9% FLUSH
3.0000 mL | INTRAVENOUS | Status: DC | PRN
  Administered 2023-07-16 (×2): 3 mL via INTRAVENOUS

## 2023-07-16 MED ORDER — HYDROMORPHONE HCL 1 MG/ML IJ SOLN
0.5000 mg | INTRAMUSCULAR | Status: DC | PRN
  Administered 2023-07-16 – 2023-07-21 (×11): 0.5 mg via INTRAVENOUS
  Filled 2023-07-16 (×2): qty 1
  Filled 2023-07-16: qty 0.5
  Filled 2023-07-16: qty 1
  Filled 2023-07-16 (×2): qty 0.5
  Filled 2023-07-16 (×4): qty 1
  Filled 2023-07-16: qty 0.5

## 2023-07-16 MED ORDER — CHLORHEXIDINE GLUCONATE 4 % EX SOLN: 60.0000 mL | Freq: Once | CUTANEOUS | Status: DC

## 2023-07-16 MED ORDER — ONDANSETRON HCL 4 MG PO TABS: 4.0000 mg | ORAL_TABLET | Freq: Four times a day (QID) | ORAL | Status: DC | PRN

## 2023-07-16 MED ORDER — FERROUS SULFATE 325 (65 FE) MG PO TABS
650.0000 mg | ORAL_TABLET | Freq: Every day | ORAL | Status: DC
  Administered 2023-07-16 – 2023-07-21 (×6): 650 mg via ORAL
  Filled 2023-07-16 (×7): qty 2

## 2023-07-16 MED ORDER — PANTOPRAZOLE SODIUM 40 MG PO TBEC
40.0000 mg | DELAYED_RELEASE_TABLET | Freq: Every day | ORAL | Status: DC
  Administered 2023-07-16 – 2023-07-22 (×6): 40 mg via ORAL
  Filled 2023-07-16 (×6): qty 1

## 2023-07-16 MED ORDER — TRANEXAMIC ACID-NACL 1000-0.7 MG/100ML-% IV SOLN
1000.0000 mg | INTRAVENOUS | Status: AC
  Administered 2023-07-17: 1000 mg via INTRAVENOUS

## 2023-07-16 MED ORDER — PAROXETINE HCL 10 MG PO TABS
10.0000 mg | ORAL_TABLET | Freq: Every morning | ORAL | Status: DC
  Administered 2023-07-16 – 2023-07-22 (×6): 10 mg via ORAL
  Filled 2023-07-16 (×6): qty 1

## 2023-07-16 MED ORDER — TRAZODONE HCL 50 MG PO TABS
50.0000 mg | ORAL_TABLET | Freq: Every evening | ORAL | Status: DC | PRN
  Administered 2023-07-20 – 2023-07-22 (×2): 50 mg via ORAL
  Filled 2023-07-16 (×2): qty 1

## 2023-07-16 MED ORDER — CEFAZOLIN SODIUM-DEXTROSE 2-4 GM/100ML-% IV SOLN
2.0000 g | INTRAVENOUS | Status: AC
  Administered 2023-07-17: 2 g via INTRAVENOUS

## 2023-07-16 MED ORDER — OXYCODONE HCL 5 MG PO TABS
5.0000 mg | ORAL_TABLET | ORAL | Status: DC | PRN
  Administered 2023-07-18: 5 mg via ORAL
  Filled 2023-07-16: qty 1

## 2023-07-16 MED ORDER — MIRTAZAPINE 15 MG PO TABS
7.5000 mg | ORAL_TABLET | Freq: Every day | ORAL | Status: DC
  Administered 2023-07-16 – 2023-07-19 (×4): 7.5 mg via ORAL
  Filled 2023-07-16 (×4): qty 1

## 2023-07-16 MED ORDER — METOPROLOL SUCCINATE ER 25 MG PO TB24
50.0000 mg | ORAL_TABLET | Freq: Every day | ORAL | Status: DC
  Administered 2023-07-16 – 2023-07-22 (×7): 50 mg via ORAL
  Filled 2023-07-16 (×7): qty 2

## 2023-07-16 NOTE — H&P (Signed)
Patient Demographics:    Virginia [name], is a 83 y.o. female  MRN: [id number]   DOB - June 23, 1940  Admit Date - 07/15/2023  Outpatient Primary MD for the patient is Junie Spencer, FNP   Assessment & Plan:   Assessment and Plan:  1)Lt Hip Fx/Acute impacted fracture of the left femoral neck---  Discussed with orthopedic surgeon Dr. Romeo Apple -Plan for operative fixation on 07/17/2023 -N.p.o. after midnight -As needed opiates for pain control -Hold Eliquis--to allow for surgery  2)CAD--post 3 Vessel CABG in 1999---  --Patient denies chest pain palpitations or dizziness prior to falling or after falling, -EKG sinus rhythm without acute findings -Continue atorvastatin, metoprolol for secondary prevention -No aspirin as patient is on Eliquis for A-fib stroke prophylaxis  3)GERD--- continue Protonix  4)DM2-A1c 6.4 reflecting excellent diabetic control PTA -Hold Farxiga Use Novolog/Humalog Sliding scale insulin with Accu-Cheks/Fingersticks as ordered   5) chronic anemia--- prior history of GI bleed, continue iron replacement  6)Possible UTI--UA suspicious for UTI,  -No fevers, no leukocytosis  -Patient is not the best historian , hold off on antibiotics -await urine culture  7) recurrent falls --- -patient reports multiple falls over the last 3 weeks or so--- she has had ambulatory dysfunction partly due to significant left hip pain--- she gets around with a wheelchair at times -Await PT eval after hip replacement  8)Depression/anxiety-----stable, continue Remeron and Paxil  Status is: Inpatient  Dispo: The patient is from: Home              Anticipated d/c is to: SNF              Anticipated d/c date is: 2 days              Patient currently is not medically stable to d/c. Barriers: Not Clinically Stable-    With History of - Reviewed by me  Past Medical History:  Diagnosis Date   Coronary artery disease    status post coronary artery bypass grafting x3 in 1999   GERD (gastroesophageal reflux disease)    Hyperlipidemia    Hypertension    Renal artery stenosis (HCC)    S/P CABG (coronary artery bypass graft) 06/18/1997   x3   Type 2 diabetes mellitus (HCC)       Past Surgical History:  Procedure Laterality Date   BIOPSY  08/19/2021   Procedure: BIOPSY;  Surgeon: Dolores Frame, MD;  Location: [facility];  Service: Gastroenterology;;   CARDIAC CATHETERIZATION  04/17/2007   L main 50-60% ostital stenosis, patent LAD, 90% stenosis at L Cfx, dominant RCA with 60-70% segmental stenosis, patent LIMA-LAD, patent free LIMA-OM, patent VG-PDA (Dr. Erlene Quan)   CARDIAC CATHETERIZATION  03/02/2003   L main with 50% osital stenosis, LAD totally occluded after 2nd diagonal, L Cfx with 90% ostial OM, RCA with 70-80% segmental stenosis, patent LIMA-LAD, patent free RIMA-Cfx marginal, patent VG to PDA (Dr. Erlene Quan)   CARDIAC CATHETERIZATION  05/18/1998   significant ostial Cfx & OM & mid LAD disease - subsequent CABG (Dr. Erlene Quan)   CAROTID DOPPLER  03/2006   right bulb with 0-49% diameter reduction   CORONARY ARTERY BYPASS GRAFT  05/19/1998   LIMA to LAD, free RIMA to OM, VG to distal RCA   ESOPHAGOGASTRODUODENOSCOPY (EGD) WITH PROPOFOL N/A 08/19/2021   Procedure: ESOPHAGOGASTRODUODENOSCOPY (EGD) WITH PROPOFOL;  Surgeon: Dolores Frame, MD;  Location: [facility];  Service: Gastroenterology;  Laterality: N/A;   ESOPHAGOGASTRODUODENOSCOPY (EGD) WITH PROPOFOL N/A 10/17/2021   Procedure: ESOPHAGOGASTRODUODENOSCOPY (EGD) WITH PROPOFOL;  Surgeon: Dolores Frame, MD;  Location: [facility];  Service: Gastroenterology;  Laterality: N/A;  730   NM MYOCAR PERF WALL MOTION  04/2011   lexiscan - fixed paical and basal-mid lateral defect, no reversible ischemia; EF 58%; PVCs  noted; low risk   RENAL DOPPLER  09/2012   right renal artery 60-99% diameter reduction, left renal artery 1-59% diameter reduction   TOTAL KNEE ARTHROPLASTY Right    Dr. Hilda Lias    Chief Complaint  Patient presents with   Knee Pain      HPI:    Virginia [name]  is a 83 y.o. female with past medical history relevant for chronic atrial fibrillation on apixaban, GERD with prior history of GI bleed, CAD--post 3 Vessel CABG in 1999, DM2, chronic anemia, depression/anxiety, she osteoarthritis of the knees and HTN presents to the ED with left knee and left hip pain after falling at home -Denies loss of consciousness -Patient reports multiple falls over the last 3 weeks or so--- she has had ambulatory dysfunction partly due to significant left hip pain--- she gets around with a wheelchair at times -Patient denies chest pain palpitations or dizziness prior to falling or after falling, -EKG sinus rhythm -Chest x-ray without acute findings -CT of the left hip without contrast shows----IMPRESSION: 1. Acute impacted fracture of the left femoral neck. 2. Remote healed fractures of the left superior and inferior pubic Rami - Knee x-rays without fractures -BMP with a creatinine of 0.67 potassium is 4.0 bicarb is 28 -CBC with a white count of 7.7 hemoglobin 15.8 and platelets of 143 -UA suggestive of UTI--- urine culture pending   Review of systems:    In addition to the HPI above,   A full Review of  Systems was done, all other systems reviewed are negative except as noted above in HPI , .    Social History:  Reviewed by me    Social History   Tobacco Use   Smoking status: Never   Smokeless tobacco: Never  Substance Use Topics   Alcohol use: No       Family History :  Reviewed by me    Family History  Problem Relation Age of Onset   Stroke Mother    Heart attack Son     Home Medications:   Prior to Admission medications   Medication Sig Start Date End Date Taking?  Authorizing Provider  acetaminophen (TYLENOL) 500 MG tablet Take 500-1,000 mg by mouth every 6 (six) hours as needed (for pain.).    [provider]  apixaban (ELIQUIS) 5 MG TABS tablet Take 1 tablet (5 mg total) by mouth 2 (two) times daily. 12/18/22   Jannifer Rodney A, FNP  ascorbic acid (VITAMIN C) 500 MG tablet Take 500 mg by mouth daily.    [provider]  atorvastatin (LIPITOR) 80 MG tablet TAKE 1 TABLET BY MOUTH DAILY 06/21/23  Hawks, Christy A, FNP  calcium-vitamin D (OSCAL WITH D) 500-5 MG-MCG tablet Take 1 tablet by mouth.    [provider]  cetirizine (ZYRTEC) 10 MG tablet TAKE ONE (1) TABLET BY MOUTH EVERY DAY 03/04/23   Jannifer Rodney A, FNP  dapagliflozin propanediol (FARXIGA) 10 MG TABS tablet TAKE 1 TABLET BY MOUTH DAILY  BEFORE BREAKFAST 02/25/23   Hawks, Neysa Bonito A, FNP  feeding supplement (ENSURE ENLIVE / ENSURE PLUS) LIQD Take 237 mLs by mouth 3 (three) times daily between meals. 12/04/22   Jannifer Rodney A, FNP  ferrous sulfate 325 (65 FE) MG tablet Take 650 mg by mouth at bedtime.    [provider]  furosemide (LASIX) 20 MG tablet Take 1 tablet (20 mg total) by mouth daily. 06/03/23   Junie Spencer, FNP  metoprolol succinate (TOPROL-XL) 50 MG 24 hr tablet TAKE 1 AND 1/2 TABLETS BY MOUTH  DAILY TAKE WITH OR IMMEDIATELY  FOLLOWING A MEAL 05/29/23   Hawks, Neysa Bonito A, FNP  mirtazapine (REMERON) 7.5 MG tablet TAKE 1 TABLET BY MOUTH AT  BEDTIME 07/15/23   Hawks, Neysa Bonito A, FNP  nystatin cream (MYCOSTATIN) Apply 1 Application topically 2 (two) times daily. 06/05/22   Jannifer Rodney A, FNP  pantoprazole (PROTONIX) 40 MG tablet Take 1 tablet (40 mg total) by mouth daily. 12/18/22   Jannifer Rodney A, FNP  PARoxetine (PAXIL) 10 MG tablet TAKE 1 TABLET BY MOUTH EVERY  MORNING 06/21/23   Jannifer Rodney A, FNP     Allergies:    No Known Allergies   Physical Exam:   Vitals  Blood pressure (!) 110/53, pulse 71, temperature 97.7 F (36.5 C), temperature  source Oral, resp. rate 20, height 5\' 4"  (1.626 m), weight 58.6 kg, SpO2 97%.  Physical Examination: General appearance - alert,  in no distress  Mental status - alert, oriented to person, place, and time (somewhat forgetful) Eyes - sclera anicteric Neck - supple, no JVD elevation , Chest - clear  to auscultation bilaterally, symmetrical air movement,  Heart - S1 and S2 normal, regular  Abdomen - soft, nontender, nondistended, +BS Neurological - screening mental status exam normal, neck supple without rigidity, cranial nerves II through XII intact, DTR's normal and symmetric Extremities - no pedal edema noted, intact peripheral pulses  Skin - warm, dry MSK- Lt Lower extremity is shortened and rotated   Data Review:    CBC Recent Labs  Lab 07/15/23 2224  WBC 7.7  HGB 15.8*  HCT 46.8*  PLT 143*  MCV 97.3  MCH 32.8  MCHC 33.8  RDW 12.9  LYMPHSABS 1.2  MONOABS 0.7  EOSABS 0.2  BASOSABS 0.0   ------------------------------------------------------------------------------------------------------------------  Chemistries  Recent Labs  Lab 07/15/23 2224  NA 139  K 4.0  CL 100  CO2 28  GLUCOSE 168*  BUN 16  CREATININE 0.67  CALCIUM 10.0   ------------------------------------------------------------------------------------------------------------------ estimated creatinine clearance is 46.8 mL/min (by C-G formula based on SCr of 0.67 mg/dL). ------------------------------------------------------------------------------------------------------------------ ------------------------------------------------------------------------------------------------------------------    Component Value Date/Time   BNP 420.1 (H) 06/03/2023 0951   Urinalysis    Component Value Date/Time   COLORURINE YELLOW 07/15/2023 2156   APPEARANCEUR HAZY (A) 07/15/2023 2156   APPEARANCEUR Cloudy (A) 02/12/2022 1119   LABSPEC 1.031 (H) 07/15/2023 2156   PHURINE 5.0 07/15/2023 2156   GLUCOSEU  >=500 (A) 07/15/2023 2156   HGBUR SMALL (A) 07/15/2023 2156   BILIRUBINUR NEGATIVE 07/15/2023 2156   BILIRUBINUR Negative 02/12/2022 1119   KETONESUR 5 (A) 07/15/2023 2156  PROTEINUR NEGATIVE 07/15/2023 2156   UROBILINOGEN 0.2 10/31/2007 1015   NITRITE NEGATIVE 07/15/2023 2156   LEUKOCYTESUR TRACE (A) 07/15/2023 2156     Imaging Results:    DG Chest Portable 1 View Result Date: 07/15/2023 CLINICAL DATA:  Weakness EXAM: PORTABLE CHEST 1 VIEW COMPARISON:  11/15/2022 FINDINGS: Chronic cardiomegaly post CABG. Unchanged mediastinal contours. No focal airspace disease, large pleural effusion or pneumothorax. Prior median sternotomy. Chronic changes about the right shoulder. IMPRESSION: Chronic cardiomegaly post CABG. No acute findings. Electronically Signed   By: Narda Rutherford M.D.   On: 07/15/2023 22:43   CT Hip Left Wo Contrast Result Date: 07/15/2023 CLINICAL DATA:  Hip trauma, fracture suspected, xray done EXAM: CT OF THE LEFT HIP WITHOUT CONTRAST TECHNIQUE: Multidetector CT imaging of the left hip was performed according to the standard protocol. Multiplanar CT image reconstructions were also generated. RADIATION DOSE REDUCTION: This exam was performed according to the departmental dose-optimization program which includes automated exposure control, adjustment of the mA and/or kV according to patient size and/or use of iterative reconstruction technique. COMPARISON:  Femur radiograph earlier today FINDINGS: Bones/Joint/Cartilage Impacted fracture of the femoral neck. Minimal apex anterior angulation. No hip dislocation. There are remote healed fractures of the left superior and inferior pubic rami. There is degenerative change of the pubic symphysis and left sacroiliac joint. Ligaments Suboptimally assessed by CT. Muscles and Tendons No intramuscular hematoma. Soft tissues Tissue edema with probable small hematoma posteriorly. Probable calcified uterine fibroids. Colonic diverticulosis.  IMPRESSION: 1. Acute impacted fracture of the left femoral neck. 2. Remote healed fractures of the left superior and inferior pubic rami. Electronically Signed   By: Narda Rutherford M.D.   On: 07/15/2023 22:42   DG Knee 2 Views Left Result Date: 07/15/2023 CLINICAL DATA:  Pain after fall, injury. EXAM: LEFT KNEE - 1-2 VIEW; LEFT FEMUR 2 VIEWS COMPARISON:  Knee radiograph 11/30/2021 FINDINGS: Knee: No acute fracture or dislocation. Moderate to advanced tricompartmental osteoarthritis. Suspected ossified intra-articular body posteriorly. Chronic calcifications anteriorly in the region of the quadriceps tendon. No large joint effusion. There is mild anterior soft tissue edema. Femur: No acute fracture. Hip alignment is maintained. No focal bone abnormalities. Vascular calcifications are seen. IMPRESSION: 1. No acute fracture or dislocation of the left knee or femur. 2. Moderate to advanced tricompartmental osteoarthritis of the knee. Electronically Signed   By: Narda Rutherford M.D.   On: 07/15/2023 19:56   DG FEMUR MIN 2 VIEWS LEFT Result Date: 07/15/2023 CLINICAL DATA:  Pain after fall, injury. EXAM: LEFT KNEE - 1-2 VIEW; LEFT FEMUR 2 VIEWS COMPARISON:  Knee radiograph 11/30/2021 FINDINGS: Knee: No acute fracture or dislocation. Moderate to advanced tricompartmental osteoarthritis. Suspected ossified intra-articular body posteriorly. Chronic calcifications anteriorly in the region of the quadriceps tendon. No large joint effusion. There is mild anterior soft tissue edema. Femur: No acute fracture. Hip alignment is maintained. No focal bone abnormalities. Vascular calcifications are seen. IMPRESSION: 1. No acute fracture or dislocation of the left knee or femur. 2. Moderate to advanced tricompartmental osteoarthritis of the knee. Electronically Signed   By: Narda Rutherford M.D.   On: 07/15/2023 19:56    Radiological Exams on Admission: DG Chest Portable 1 View Result Date: 07/15/2023 CLINICAL DATA:   Weakness EXAM: PORTABLE CHEST 1 VIEW COMPARISON:  11/15/2022 FINDINGS: Chronic cardiomegaly post CABG. Unchanged mediastinal contours. No focal airspace disease, large pleural effusion or pneumothorax. Prior median sternotomy. Chronic changes about the right shoulder. IMPRESSION: Chronic cardiomegaly post CABG. No acute findings. Electronically  Signed   By: Narda Rutherford M.D.   On: 07/15/2023 22:43   CT Hip Left Wo Contrast Result Date: 07/15/2023 CLINICAL DATA:  Hip trauma, fracture suspected, xray done EXAM: CT OF THE LEFT HIP WITHOUT CONTRAST TECHNIQUE: Multidetector CT imaging of the left hip was performed according to the standard protocol. Multiplanar CT image reconstructions were also generated. RADIATION DOSE REDUCTION: This exam was performed according to the departmental dose-optimization program which includes automated exposure control, adjustment of the mA and/or kV according to patient size and/or use of iterative reconstruction technique. COMPARISON:  Femur radiograph earlier today FINDINGS: Bones/Joint/Cartilage Impacted fracture of the femoral neck. Minimal apex anterior angulation. No hip dislocation. There are remote healed fractures of the left superior and inferior pubic rami. There is degenerative change of the pubic symphysis and left sacroiliac joint. Ligaments Suboptimally assessed by CT. Muscles and Tendons No intramuscular hematoma. Soft tissues Tissue edema with probable small hematoma posteriorly. Probable calcified uterine fibroids. Colonic diverticulosis. IMPRESSION: 1. Acute impacted fracture of the left femoral neck. 2. Remote healed fractures of the left superior and inferior pubic rami. Electronically Signed   By: Narda Rutherford M.D.   On: 07/15/2023 22:42   DG Knee 2 Views Left Result Date: 07/15/2023 CLINICAL DATA:  Pain after fall, injury. EXAM: LEFT KNEE - 1-2 VIEW; LEFT FEMUR 2 VIEWS COMPARISON:  Knee radiograph 11/30/2021 FINDINGS: Knee: No acute fracture or  dislocation. Moderate to advanced tricompartmental osteoarthritis. Suspected ossified intra-articular body posteriorly. Chronic calcifications anteriorly in the region of the quadriceps tendon. No large joint effusion. There is mild anterior soft tissue edema. Femur: No acute fracture. Hip alignment is maintained. No focal bone abnormalities. Vascular calcifications are seen. IMPRESSION: 1. No acute fracture or dislocation of the left knee or femur. 2. Moderate to advanced tricompartmental osteoarthritis of the knee. Electronically Signed   By: Narda Rutherford M.D.   On: 07/15/2023 19:56   DG FEMUR MIN 2 VIEWS LEFT Result Date: 07/15/2023 CLINICAL DATA:  Pain after fall, injury. EXAM: LEFT KNEE - 1-2 VIEW; LEFT FEMUR 2 VIEWS COMPARISON:  Knee radiograph 11/30/2021 FINDINGS: Knee: No acute fracture or dislocation. Moderate to advanced tricompartmental osteoarthritis. Suspected ossified intra-articular body posteriorly. Chronic calcifications anteriorly in the region of the quadriceps tendon. No large joint effusion. There is mild anterior soft tissue edema. Femur: No acute fracture. Hip alignment is maintained. No focal bone abnormalities. Vascular calcifications are seen. IMPRESSION: 1. No acute fracture or dislocation of the left knee or femur. 2. Moderate to advanced tricompartmental osteoarthritis of the knee. Electronically Signed   By: Narda Rutherford M.D.   On: 07/15/2023 19:56    DVT Prophylaxis -SCD /Heparin AM Labs Ordered, also please review Full Orders  Family Communication: Admission, patients condition and plan of care including tests being ordered have been discussed with the patient and son  who indicate understanding and agree with the plan   Condition  -stable  Shon Hale M.D on 07/16/2023 at 4:19 PM Go to [email or web address] -  for contact info  [facility] - Office  [phone]

## 2023-07-16 NOTE — Consult Note (Incomplete)
Treatment Team:  Vickki Hearing, MD     ORTHOPAEDIC CONSULTATION  REQUESTING PHYSICIAN: Shon Hale, MD  ASSESSMENT AND PLAN: 83 y.o. female with the following: fracture left hip   This patient requires inpatient admission to manage this problem appropriately. Orthopedics recommends admission to a medical service and we will provide consultation and follow along  Rec left hip bipolar  The procedure has been fully reviewed with the patient; The risks and benefits of surgery have been discussed and explained and understood. Alternative treatment has also been reviewed, questions were encouraged and answered. The postoperative plan is also been reviewed.   Chief Complaint: pain left hip   HPI: Virginia [name] is a 83 y.o. female with  h/o anticoagulation, household Ambulator with severe OA left knee, fell at home  C/o severe non radiating left hip pain and inability to walk Left knee has caused severe immobility from end stage OA   Past Medical History:  Diagnosis Date   Coronary artery disease    status post coronary artery bypass grafting x3 in 1999   GERD (gastroesophageal reflux disease)    Hyperlipidemia    Hypertension    Renal artery stenosis (HCC)    S/P CABG (coronary artery bypass graft) 06/18/1997   x3   Type 2 diabetes mellitus (HCC)    Past Surgical History:  Procedure Laterality Date   BIOPSY  08/19/2021   Procedure: BIOPSY;  Surgeon: Dolores Frame, MD;  Location: [facility];  Service: Gastroenterology;;   CARDIAC CATHETERIZATION  04/17/2007   L main 50-60% ostital stenosis, patent LAD, 90% stenosis at L Cfx, dominant RCA with 60-70% segmental stenosis, patent LIMA-LAD, patent free LIMA-OM, patent VG-PDA (Dr. Erlene Quan)   CARDIAC CATHETERIZATION  03/02/2003   L main with 50% osital stenosis, LAD totally occluded after 2nd diagonal, L Cfx with 90% ostial OM, RCA with 70-80% segmental stenosis, patent LIMA-LAD, patent free RIMA-Cfx marginal,  patent VG to PDA (Dr. Erlene Quan)   CARDIAC CATHETERIZATION  05/18/1998   significant ostial Cfx & OM & mid LAD disease - subsequent CABG (Dr. Erlene Quan)   CAROTID DOPPLER  03/2006   right bulb with 0-49% diameter reduction   CORONARY ARTERY BYPASS GRAFT  05/19/1998   LIMA to LAD, free RIMA to OM, VG to distal RCA   ESOPHAGOGASTRODUODENOSCOPY (EGD) WITH PROPOFOL N/A 08/19/2021   Procedure: ESOPHAGOGASTRODUODENOSCOPY (EGD) WITH PROPOFOL;  Surgeon: Dolores Frame, MD;  Location: [facility];  Service: Gastroenterology;  Laterality: N/A;   ESOPHAGOGASTRODUODENOSCOPY (EGD) WITH PROPOFOL N/A 10/17/2021   Procedure: ESOPHAGOGASTRODUODENOSCOPY (EGD) WITH PROPOFOL;  Surgeon: Dolores Frame, MD;  Location: [facility];  Service: Gastroenterology;  Laterality: N/A;  730   NM MYOCAR PERF WALL MOTION  04/2011   lexiscan - fixed paical and basal-mid lateral defect, no reversible ischemia; EF 58%; PVCs noted; low risk   RENAL DOPPLER  09/2012   right renal artery 60-99% diameter reduction, left renal artery 1-59% diameter reduction   TOTAL KNEE ARTHROPLASTY Right    Dr. Hilda Lias   Social History   Socioeconomic History   Marital status: Widowed    Spouse name: Not on file   Number of children: 3   Years of education: 12   Highest education level: High school graduate  Occupational History   Occupation: retired  Tobacco Use   Smoking status: Never   Smokeless tobacco: Never  Vaping Use   Vaping status: Never Used  Substance and Sexual Activity   Alcohol  use: No   Drug use: No   Sexual activity: Not Currently    Birth control/protection: Post-menopausal  Other Topics Concern   Not on file  Social History Narrative   Not on file   Social Drivers of Health   Financial Resource Strain: Low Risk  (01/23/2023)   Overall Financial Resource Strain (CARDIA)    Difficulty of Paying Living Expenses: Not hard at all  Food Insecurity: No Food Insecurity (07/16/2023)   Hunger Vital  Sign    Worried About Running Out of Food in the Last Year: Never true    Ran Out of Food in the Last Year: Never true  Transportation Needs: No Transportation Needs (07/16/2023)   PRAPARE - Administrator, Civil Service (Medical): No    Lack of Transportation (Non-Medical): No  Physical Activity: Insufficiently Active (01/23/2023)   Exercise Vital Sign    Days of Exercise per Week: 2 days    Minutes of Exercise per Session: 30 min  Stress: No Stress Concern Present (01/23/2023)   [employer] of Occupational Health - Occupational Stress Questionnaire    Feeling of Stress : Not at all  Social Connections: Moderately Isolated (07/16/2023)   Social Connection and Isolation Panel [NHANES]    Frequency of Communication with Friends and Family: More than three times a week    Frequency of Social Gatherings with Friends and Family: More than three times a week    Attends Religious Services: More than 4 times per year    Active Member of [employer] or Organizations: No    Attends Banker Meetings: Never    Marital Status: Divorced   Family History  Problem Relation Age of Onset   Stroke Mother    Heart attack Son    No Known Allergies Prior to Admission medications   Medication Sig Start Date End Date Taking? Authorizing Provider  acetaminophen (TYLENOL) 500 MG tablet Take 500-1,000 mg by mouth every 6 (six) hours as needed (for pain.).    [provider]  apixaban (ELIQUIS) 5 MG TABS tablet Take 1 tablet (5 mg total) by mouth 2 (two) times daily. 12/18/22   Jannifer Rodney A, FNP  ascorbic acid (VITAMIN C) 500 MG tablet Take 500 mg by mouth daily.    [provider]  atorvastatin (LIPITOR) 80 MG tablet TAKE 1 TABLET BY MOUTH DAILY 06/21/23   Jannifer Rodney A, FNP  calcium-vitamin D (OSCAL WITH D) 500-5 MG-MCG tablet Take 1 tablet by mouth.    [provider]  cetirizine (ZYRTEC) 10 MG tablet TAKE ONE (1) TABLET BY MOUTH EVERY DAY 03/04/23   Jannifer Rodney A, FNP  dapagliflozin propanediol (FARXIGA) 10 MG TABS tablet TAKE 1 TABLET BY MOUTH DAILY  BEFORE BREAKFAST 02/25/23   Hawks, Neysa Bonito A, FNP  feeding supplement (ENSURE ENLIVE / ENSURE PLUS) LIQD Take 237 mLs by mouth 3 (three) times daily between meals. 12/04/22   Jannifer Rodney A, FNP  ferrous sulfate 325 (65 FE) MG tablet Take 650 mg by mouth at bedtime.    [provider]  furosemide (LASIX) 20 MG tablet Take 1 tablet (20 mg total) by mouth daily. 06/03/23   Junie Spencer, FNP  metoprolol succinate (TOPROL-XL) 50 MG 24 hr tablet TAKE 1 AND 1/2 TABLETS BY MOUTH  DAILY TAKE WITH OR IMMEDIATELY  FOLLOWING A MEAL 05/29/23   Hawks, Neysa Bonito A, FNP  mirtazapine (REMERON) 7.5 MG tablet TAKE 1 TABLET BY MOUTH AT  BEDTIME 07/15/23  Hawks, Christy A, FNP  nystatin cream (MYCOSTATIN) Apply 1 Application topically 2 (two) times daily. 06/05/22   Jannifer Rodney A, FNP  pantoprazole (PROTONIX) 40 MG tablet Take 1 tablet (40 mg total) by mouth daily. 12/18/22   Junie Spencer, FNP  PARoxetine (PAXIL) 10 MG tablet TAKE 1 TABLET BY MOUTH EVERY  MORNING 06/21/23   Junie Spencer, FNP   DG Chest Portable 1 View Result Date: 07/15/2023 CLINICAL DATA:  Weakness EXAM: PORTABLE CHEST 1 VIEW COMPARISON:  11/15/2022 FINDINGS: Chronic cardiomegaly post CABG. Unchanged mediastinal contours. No focal airspace disease, large pleural effusion or pneumothorax. Prior median sternotomy. Chronic changes about the right shoulder. IMPRESSION: Chronic cardiomegaly post CABG. No acute findings. Electronically Signed   By: Narda Rutherford M.D.   On: 07/15/2023 22:43   CT Hip Left Wo Contrast Result Date: 07/15/2023 CLINICAL DATA:  Hip trauma, fracture suspected, xray done EXAM: CT OF THE LEFT HIP WITHOUT CONTRAST TECHNIQUE: Multidetector CT imaging of the left hip was performed according to the standard protocol. Multiplanar CT image reconstructions were also generated. RADIATION DOSE REDUCTION: This exam was  performed according to the departmental dose-optimization program which includes automated exposure control, adjustment of the mA and/or kV according to patient size and/or use of iterative reconstruction technique. COMPARISON:  Femur radiograph earlier today FINDINGS: Bones/Joint/Cartilage Impacted fracture of the femoral neck. Minimal apex anterior angulation. No hip dislocation. There are remote healed fractures of the left superior and inferior pubic rami. There is degenerative change of the pubic symphysis and left sacroiliac joint. Ligaments Suboptimally assessed by CT. Muscles and Tendons No intramuscular hematoma. Soft tissues Tissue edema with probable small hematoma posteriorly. Probable calcified uterine fibroids. Colonic diverticulosis. IMPRESSION: 1. Acute impacted fracture of the left femoral neck. 2. Remote healed fractures of the left superior and inferior pubic rami. Electronically Signed   By: Narda Rutherford M.D.   On: 07/15/2023 22:42   DG Knee 2 Views Left Result Date: 07/15/2023 CLINICAL DATA:  Pain after fall, injury. EXAM: LEFT KNEE - 1-2 VIEW; LEFT FEMUR 2 VIEWS COMPARISON:  Knee radiograph 11/30/2021 FINDINGS: Knee: No acute fracture or dislocation. Moderate to advanced tricompartmental osteoarthritis. Suspected ossified intra-articular body posteriorly. Chronic calcifications anteriorly in the region of the quadriceps tendon. No large joint effusion. There is mild anterior soft tissue edema. Femur: No acute fracture. Hip alignment is maintained. No focal bone abnormalities. Vascular calcifications are seen. IMPRESSION: 1. No acute fracture or dislocation of the left knee or femur. 2. Moderate to advanced tricompartmental osteoarthritis of the knee. Electronically Signed   By: Narda Rutherford M.D.   On: 07/15/2023 19:56   DG FEMUR MIN 2 VIEWS LEFT Result Date: 07/15/2023 CLINICAL DATA:  Pain after fall, injury. EXAM: LEFT KNEE - 1-2 VIEW; LEFT FEMUR 2 VIEWS COMPARISON:  Knee  radiograph 11/30/2021 FINDINGS: Knee: No acute fracture or dislocation. Moderate to advanced tricompartmental osteoarthritis. Suspected ossified intra-articular body posteriorly. Chronic calcifications anteriorly in the region of the quadriceps tendon. No large joint effusion. There is mild anterior soft tissue edema. Femur: No acute fracture. Hip alignment is maintained. No focal bone abnormalities. Vascular calcifications are seen. IMPRESSION: 1. No acute fracture or dislocation of the left knee or femur. 2. Moderate to advanced tricompartmental osteoarthritis of the knee. Electronically Signed   By: Narda Rutherford M.D.   On: 07/15/2023 19:56   Family History Reviewed and non-contributory, no pertinent history of problems with bleeding or anesthesia    Review of Systems Dementia ++ No fevers  or chills No numbness or tingling No chest pain No shortness of breath No bowel or bladder dysfunction No GI distress No headaches    OBJECTIVE  Vitals:Patient Vitals for the past 8 hrs:  BP Temp Temp src Pulse Resp SpO2  07/16/23 1821 (!) 112/59 98 F (36.7 C) Oral 85 18 95 %  07/16/23 1422 (!) 110/53 97.7 F (36.5 C) Oral 71 20 97 %   General: Alert, no acute distress Cardiovascular: Warm extremities noted Respiratory: No cyanosis, no use of accessory musculature GI: No organomegaly, abdomen is soft and non-tender Skin: No lesions in the area of chief complaint other than those listed below in MSK exam.  Neurologic: Sensation intact distally save for the below mentioned MSK exam Psychiatric: Patient is competent for consent with normal mood and affect Lymphatic: No swelling obvious and reported other than the area involved in the exam below Extremities  NWG:NFAOZH  YQM:VHQION  GEX:BMWUXL  KGM:WNUUVOZD: ext rot., tender left hip; stable; normal muscle tone     Test Results Imaging I see a left femoral nck frx on xrays and on CT it shows high degree of apex anterior angulation    Labs cbc Recent Labs    07/15/23 2224  WBC 7.7  HGB 15.8*  HCT 46.8*  PLT 143*    Labs inflam No results for input(s): "CRP" in the last 72 hours.  Invalid input(s): "ESR"  Labs coag No results for input(s): "INR", "PTT" in the last 72 hours.  Invalid input(s): "PT"  Recent Labs    07/15/23 2224  NA 139  K 4.0  CL 100  CO2 28  GLUCOSE 168*  BUN 16  CREATININE 0.67  CALCIUM 10.0

## 2023-07-16 NOTE — Plan of Care (Signed)
progressing

## 2023-07-16 NOTE — Progress Notes (Addendum)
Admitted to room . Vitals stable,  Alert and oriented x 2 or 3 , did not know where she was but knew that she had fallen.  Swallowed pills with no difficulty.  MRSA swab completed. Sacral dressing, scds placed and ted hose placed on right leg.  Son and granddaughter at bedside.  Dr. Romeo Apple said patient is NWB at this point on left leg.

## 2023-07-16 NOTE — Progress Notes (Signed)
Patient ID: Virginia [name], female   DOB: September 29, 1940, 83 y.o.   MRN: [id number]  Left hip fracture   Needs surgery   Surgery tomorrow   Stop anticoagulants and npo after midnight

## 2023-07-16 NOTE — H&P (View-Only) (Signed)
Treatment Team:  Vickki Hearing, MD     ORTHOPAEDIC CONSULTATION  REQUESTING PHYSICIAN: Shon Hale, MD  ASSESSMENT AND PLAN: 83 y.o. female with the following: fracture left hip   This patient requires inpatient admission to manage this problem appropriately. Orthopedics recommends admission to a medical service and we will provide consultation and follow along  Rec left hip bipolar  The procedure has been fully reviewed with the patient; The risks and benefits of surgery have been discussed and explained and understood. Alternative treatment has also been reviewed, questions were encouraged and answered. The postoperative plan is also been reviewed.   Chief Complaint: pain left hip   HPI: Virginia [name] is a 83 y.o. female with  h/o anticoagulation, household Ambulator with severe OA left knee, fell at home  C/o severe non radiating left hip pain and inability to walk Left knee has caused severe immobility from end stage OA   Past Medical History:  Diagnosis Date   Coronary artery disease    status post coronary artery bypass grafting x3 in 1999   GERD (gastroesophageal reflux disease)    Hyperlipidemia    Hypertension    Renal artery stenosis (HCC)    S/P CABG (coronary artery bypass graft) 06/18/1997   x3   Type 2 diabetes mellitus (HCC)    Past Surgical History:  Procedure Laterality Date   BIOPSY  08/19/2021   Procedure: BIOPSY;  Surgeon: Dolores Frame, MD;  Location: [facility];  Service: Gastroenterology;;   CARDIAC CATHETERIZATION  04/17/2007   L main 50-60% ostital stenosis, patent LAD, 90% stenosis at L Cfx, dominant RCA with 60-70% segmental stenosis, patent LIMA-LAD, patent free LIMA-OM, patent VG-PDA (Dr. Erlene Quan)   CARDIAC CATHETERIZATION  03/02/2003   L main with 50% osital stenosis, LAD totally occluded after 2nd diagonal, L Cfx with 90% ostial OM, RCA with 70-80% segmental stenosis, patent LIMA-LAD, patent free RIMA-Cfx marginal,  patent VG to PDA (Dr. Erlene Quan)   CARDIAC CATHETERIZATION  05/18/1998   significant ostial Cfx & OM & mid LAD disease - subsequent CABG (Dr. Erlene Quan)   CAROTID DOPPLER  03/2006   right bulb with 0-49% diameter reduction   CORONARY ARTERY BYPASS GRAFT  05/19/1998   LIMA to LAD, free RIMA to OM, VG to distal RCA   ESOPHAGOGASTRODUODENOSCOPY (EGD) WITH PROPOFOL N/A 08/19/2021   Procedure: ESOPHAGOGASTRODUODENOSCOPY (EGD) WITH PROPOFOL;  Surgeon: Dolores Frame, MD;  Location: [facility];  Service: Gastroenterology;  Laterality: N/A;   ESOPHAGOGASTRODUODENOSCOPY (EGD) WITH PROPOFOL N/A 10/17/2021   Procedure: ESOPHAGOGASTRODUODENOSCOPY (EGD) WITH PROPOFOL;  Surgeon: Dolores Frame, MD;  Location: [facility];  Service: Gastroenterology;  Laterality: N/A;  730   NM MYOCAR PERF WALL MOTION  04/2011   lexiscan - fixed paical and basal-mid lateral defect, no reversible ischemia; EF 58%; PVCs noted; low risk   RENAL DOPPLER  09/2012   right renal artery 60-99% diameter reduction, left renal artery 1-59% diameter reduction   TOTAL KNEE ARTHROPLASTY Right    Dr. Hilda Lias   Social History   Socioeconomic History   Marital status: Widowed    Spouse name: Not on file   Number of children: 3   Years of education: 12   Highest education level: High school graduate  Occupational History   Occupation: retired  Tobacco Use   Smoking status: Never   Smokeless tobacco: Never  Vaping Use   Vaping status: Never Used  Substance and Sexual Activity   Alcohol  use: No   Drug use: No   Sexual activity: Not Currently    Birth control/protection: Post-menopausal  Other Topics Concern   Not on file  Social History Narrative   Not on file   Social Drivers of Health   Financial Resource Strain: Low Risk  (01/23/2023)   Overall Financial Resource Strain (CARDIA)    Difficulty of Paying Living Expenses: Not hard at all  Food Insecurity: No Food Insecurity (07/16/2023)   Hunger Vital  Sign    Worried About Running Out of Food in the Last Year: Never true    Ran Out of Food in the Last Year: Never true  Transportation Needs: No Transportation Needs (07/16/2023)   PRAPARE - Administrator, Civil Service (Medical): No    Lack of Transportation (Non-Medical): No  Physical Activity: Insufficiently Active (01/23/2023)   Exercise Vital Sign    Days of Exercise per Week: 2 days    Minutes of Exercise per Session: 30 min  Stress: No Stress Concern Present (01/23/2023)   [employer] of Occupational Health - Occupational Stress Questionnaire    Feeling of Stress : Not at all  Social Connections: Moderately Isolated (07/16/2023)   Social Connection and Isolation Panel [NHANES]    Frequency of Communication with Friends and Family: More than three times a week    Frequency of Social Gatherings with Friends and Family: More than three times a week    Attends Religious Services: More than 4 times per year    Active Member of [employer] or Organizations: No    Attends Banker Meetings: Never    Marital Status: Divorced   Family History  Problem Relation Age of Onset   Stroke Mother    Heart attack Son    No Known Allergies Prior to Admission medications   Medication Sig Start Date End Date Taking? Authorizing Provider  acetaminophen (TYLENOL) 500 MG tablet Take 500-1,000 mg by mouth every 6 (six) hours as needed (for pain.).    [provider]  apixaban (ELIQUIS) 5 MG TABS tablet Take 1 tablet (5 mg total) by mouth 2 (two) times daily. 12/18/22   Jannifer Rodney A, FNP  ascorbic acid (VITAMIN C) 500 MG tablet Take 500 mg by mouth daily.    [provider]  atorvastatin (LIPITOR) 80 MG tablet TAKE 1 TABLET BY MOUTH DAILY 06/21/23   Jannifer Rodney A, FNP  calcium-vitamin D (OSCAL WITH D) 500-5 MG-MCG tablet Take 1 tablet by mouth.    [provider]  cetirizine (ZYRTEC) 10 MG tablet TAKE ONE (1) TABLET BY MOUTH EVERY DAY 03/04/23   Jannifer Rodney A, FNP  dapagliflozin propanediol (FARXIGA) 10 MG TABS tablet TAKE 1 TABLET BY MOUTH DAILY  BEFORE BREAKFAST 02/25/23   Hawks, Neysa Bonito A, FNP  feeding supplement (ENSURE ENLIVE / ENSURE PLUS) LIQD Take 237 mLs by mouth 3 (three) times daily between meals. 12/04/22   Jannifer Rodney A, FNP  ferrous sulfate 325 (65 FE) MG tablet Take 650 mg by mouth at bedtime.    [provider]  furosemide (LASIX) 20 MG tablet Take 1 tablet (20 mg total) by mouth daily. 06/03/23   Junie Spencer, FNP  metoprolol succinate (TOPROL-XL) 50 MG 24 hr tablet TAKE 1 AND 1/2 TABLETS BY MOUTH  DAILY TAKE WITH OR IMMEDIATELY  FOLLOWING A MEAL 05/29/23   Hawks, Neysa Bonito A, FNP  mirtazapine (REMERON) 7.5 MG tablet TAKE 1 TABLET BY MOUTH AT  BEDTIME 07/15/23  Hawks, Christy A, FNP  nystatin cream (MYCOSTATIN) Apply 1 Application topically 2 (two) times daily. 06/05/22   Jannifer Rodney A, FNP  pantoprazole (PROTONIX) 40 MG tablet Take 1 tablet (40 mg total) by mouth daily. 12/18/22   Junie Spencer, FNP  PARoxetine (PAXIL) 10 MG tablet TAKE 1 TABLET BY MOUTH EVERY  MORNING 06/21/23   Junie Spencer, FNP   DG Chest Portable 1 View Result Date: 07/15/2023 CLINICAL DATA:  Weakness EXAM: PORTABLE CHEST 1 VIEW COMPARISON:  11/15/2022 FINDINGS: Chronic cardiomegaly post CABG. Unchanged mediastinal contours. No focal airspace disease, large pleural effusion or pneumothorax. Prior median sternotomy. Chronic changes about the right shoulder. IMPRESSION: Chronic cardiomegaly post CABG. No acute findings. Electronically Signed   By: Narda Rutherford M.D.   On: 07/15/2023 22:43   CT Hip Left Wo Contrast Result Date: 07/15/2023 CLINICAL DATA:  Hip trauma, fracture suspected, xray done EXAM: CT OF THE LEFT HIP WITHOUT CONTRAST TECHNIQUE: Multidetector CT imaging of the left hip was performed according to the standard protocol. Multiplanar CT image reconstructions were also generated. RADIATION DOSE REDUCTION: This exam was  performed according to the departmental dose-optimization program which includes automated exposure control, adjustment of the mA and/or kV according to patient size and/or use of iterative reconstruction technique. COMPARISON:  Femur radiograph earlier today FINDINGS: Bones/Joint/Cartilage Impacted fracture of the femoral neck. Minimal apex anterior angulation. No hip dislocation. There are remote healed fractures of the left superior and inferior pubic rami. There is degenerative change of the pubic symphysis and left sacroiliac joint. Ligaments Suboptimally assessed by CT. Muscles and Tendons No intramuscular hematoma. Soft tissues Tissue edema with probable small hematoma posteriorly. Probable calcified uterine fibroids. Colonic diverticulosis. IMPRESSION: 1. Acute impacted fracture of the left femoral neck. 2. Remote healed fractures of the left superior and inferior pubic rami. Electronically Signed   By: Narda Rutherford M.D.   On: 07/15/2023 22:42   DG Knee 2 Views Left Result Date: 07/15/2023 CLINICAL DATA:  Pain after fall, injury. EXAM: LEFT KNEE - 1-2 VIEW; LEFT FEMUR 2 VIEWS COMPARISON:  Knee radiograph 11/30/2021 FINDINGS: Knee: No acute fracture or dislocation. Moderate to advanced tricompartmental osteoarthritis. Suspected ossified intra-articular body posteriorly. Chronic calcifications anteriorly in the region of the quadriceps tendon. No large joint effusion. There is mild anterior soft tissue edema. Femur: No acute fracture. Hip alignment is maintained. No focal bone abnormalities. Vascular calcifications are seen. IMPRESSION: 1. No acute fracture or dislocation of the left knee or femur. 2. Moderate to advanced tricompartmental osteoarthritis of the knee. Electronically Signed   By: Narda Rutherford M.D.   On: 07/15/2023 19:56   DG FEMUR MIN 2 VIEWS LEFT Result Date: 07/15/2023 CLINICAL DATA:  Pain after fall, injury. EXAM: LEFT KNEE - 1-2 VIEW; LEFT FEMUR 2 VIEWS COMPARISON:  Knee  radiograph 11/30/2021 FINDINGS: Knee: No acute fracture or dislocation. Moderate to advanced tricompartmental osteoarthritis. Suspected ossified intra-articular body posteriorly. Chronic calcifications anteriorly in the region of the quadriceps tendon. No large joint effusion. There is mild anterior soft tissue edema. Femur: No acute fracture. Hip alignment is maintained. No focal bone abnormalities. Vascular calcifications are seen. IMPRESSION: 1. No acute fracture or dislocation of the left knee or femur. 2. Moderate to advanced tricompartmental osteoarthritis of the knee. Electronically Signed   By: Narda Rutherford M.D.   On: 07/15/2023 19:56   Family History Reviewed and non-contributory, no pertinent history of problems with bleeding or anesthesia    Review of Systems Dementia ++ No fevers  or chills No numbness or tingling No chest pain No shortness of breath No bowel or bladder dysfunction No GI distress No headaches    OBJECTIVE  Vitals:Patient Vitals for the past 8 hrs:  BP Temp Temp src Pulse Resp SpO2  07/16/23 1821 (!) 112/59 98 F (36.7 C) Oral 85 18 95 %  07/16/23 1422 (!) 110/53 97.7 F (36.5 C) Oral 71 20 97 %   General: Alert, no acute distress Cardiovascular: Warm extremities noted Respiratory: No cyanosis, no use of accessory musculature GI: No organomegaly, abdomen is soft and non-tender Skin: No lesions in the area of chief complaint other than those listed below in MSK exam.  Neurologic: Sensation intact distally save for the below mentioned MSK exam Psychiatric: Patient is competent for consent with normal mood and affect Lymphatic: No swelling obvious and reported other than the area involved in the exam below Extremities  UEA:VWUJWJ  XBJ:YNWGNF  AOZ:HYQMVH  QIO:NGEXBMWU: ext rot., tender left hip; stable; normal muscle tone     Test Results Imaging I see a left femoral nck frx on xrays and on CT it shows high degree of apex anterior angulation    Labs cbc Recent Labs    07/15/23 2224  WBC 7.7  HGB 15.8*  HCT 46.8*  PLT 143*    Labs inflam No results for input(s): "CRP" in the last 72 hours.  Invalid input(s): "ESR"  Labs coag No results for input(s): "INR", "PTT" in the last 72 hours.  Invalid input(s): "PT"  Recent Labs    07/15/23 2224  NA 139  K 4.0  CL 100  CO2 28  GLUCOSE 168*  BUN 16  CREATININE 0.67  CALCIUM 10.0

## 2023-07-17 ENCOUNTER — Other Ambulatory Visit: Payer: Self-pay

## 2023-07-17 ENCOUNTER — Inpatient Hospital Stay (HOSPITAL_COMMUNITY): Payer: Medicare Other | Admitting: Anesthesiology

## 2023-07-17 ENCOUNTER — Encounter (HOSPITAL_COMMUNITY): Payer: Self-pay | Admitting: Internal Medicine

## 2023-07-17 ENCOUNTER — Inpatient Hospital Stay (HOSPITAL_COMMUNITY): Payer: Medicare Other

## 2023-07-17 ENCOUNTER — Encounter (HOSPITAL_COMMUNITY)
Admission: EM | Disposition: A | Discharge status: Skilled Nursing Facility | Payer: Self-pay | Source: Home / Self Care | Attending: Internal Medicine

## 2023-07-17 DIAGNOSIS — I482 Chronic atrial fibrillation, unspecified: Secondary | ICD-10-CM | POA: Diagnosis not present

## 2023-07-17 DIAGNOSIS — S72002A Fracture of unspecified part of neck of left femur, initial encounter for closed fracture: Secondary | ICD-10-CM | POA: Diagnosis not present

## 2023-07-17 DIAGNOSIS — R531 Weakness: Secondary | ICD-10-CM

## 2023-07-17 DIAGNOSIS — G250 Essential tremor: Secondary | ICD-10-CM

## 2023-07-17 DIAGNOSIS — K219 Gastro-esophageal reflux disease without esophagitis: Secondary | ICD-10-CM | POA: Diagnosis not present

## 2023-07-17 DIAGNOSIS — E119 Type 2 diabetes mellitus without complications: Secondary | ICD-10-CM | POA: Diagnosis not present

## 2023-07-17 DIAGNOSIS — E1169 Type 2 diabetes mellitus with other specified complication: Secondary | ICD-10-CM

## 2023-07-17 DIAGNOSIS — I10 Essential (primary) hypertension: Secondary | ICD-10-CM | POA: Diagnosis not present

## 2023-07-17 HISTORY — PX: HIP ARTHROPLASTY: SHX981

## 2023-07-17 LAB — BASIC METABOLIC PANEL
Anion gap: 9 (ref 5–15)
BUN: 24 mg/dL — ABNORMAL HIGH (ref 8–23)
CO2: 27 mmol/L (ref 22–32)
Calcium: 9.5 mg/dL (ref 8.9–10.3)
Chloride: 101 mmol/L (ref 98–111)
Creatinine, Ser: 0.61 mg/dL (ref 0.44–1.00)
GFR, Estimated: >60 mL/min (ref >60)
Glucose, Bld: 139 mg/dL — ABNORMAL HIGH (ref 70–99)
Potassium: 3.5 mmol/L (ref 3.5–5.1)
Sodium: 137 mmol/L (ref 135–145)

## 2023-07-17 LAB — CBC
HCT: 44.9 % (ref 36.0–46.0)
Hemoglobin: 14.9 g/dL (ref 12.0–15.0)
MCH: 32.1 pg (ref 26.0–34.0)
MCHC: 33.2 g/dL (ref 30.0–36.0)
MCV: 96.8 fL (ref 80.0–100.0)
Platelets: 146 10*3/uL — ABNORMAL LOW (ref 150–400)
RBC: 4.64 MIL/uL (ref 3.87–5.11)
RDW: 12.8 % (ref 11.5–15.5)
WBC: 8.5 10*3/uL (ref 4.0–10.5)
nRBC: 0 % (ref 0.0–0.2)

## 2023-07-17 LAB — GLUCOSE, CAPILLARY
Glucose-Capillary: 154 mg/dL — ABNORMAL HIGH (ref 70–99)
Glucose-Capillary: 203 mg/dL — ABNORMAL HIGH (ref 70–99)

## 2023-07-17 SURGERY — HEMIARTHROPLASTY, HIP, DIRECT ANTERIOR APPROACH, FOR FRACTURE
Anesthesia: General | Site: Hip | Laterality: Left

## 2023-07-17 MED ORDER — METOPROLOL TARTRATE 5 MG/5ML IV SOLN
INTRAVENOUS | Status: AC
  Filled 2023-07-17: qty 5

## 2023-07-17 MED ORDER — ONDANSETRON HCL 4 MG/2ML IJ SOLN
INTRAMUSCULAR | Status: DC | PRN
  Administered 2023-07-17: 4 mg via INTRAVENOUS

## 2023-07-17 MED ORDER — OXYCODONE HCL 5 MG PO TABS: 5.0000 mg | ORAL_TABLET | Freq: Once | ORAL | Status: DC | PRN

## 2023-07-17 MED ORDER — LIDOCAINE HCL (PF) 2 % IJ SOLN
INTRAMUSCULAR | Status: AC
  Filled 2023-07-17: qty 5

## 2023-07-17 MED ORDER — FENTANYL CITRATE (PF) 100 MCG/2ML IJ SOLN
INTRAMUSCULAR | Status: AC
  Filled 2023-07-17: qty 2

## 2023-07-17 MED ORDER — CHLORHEXIDINE GLUCONATE 0.12 % MT SOLN: 15.0000 mL | Freq: Once | OROMUCOSAL | Status: DC

## 2023-07-17 MED ORDER — ORAL CARE MOUTH RINSE: 15.0000 mL | Freq: Once | OROMUCOSAL | Status: DC

## 2023-07-17 MED ORDER — LACTATED RINGERS IV SOLN: INTRAVENOUS | Status: DC

## 2023-07-17 MED ORDER — ESMOLOL HCL 100 MG/10ML IV SOLN
INTRAVENOUS | Status: DC | PRN
  Administered 2023-07-17: 20 mg via INTRAVENOUS
  Administered 2023-07-17: 30 mg via INTRAVENOUS
  Administered 2023-07-17: 20 mg via INTRAVENOUS
  Administered 2023-07-17: 10 mg via INTRAVENOUS
  Administered 2023-07-17: 20 mg via INTRAVENOUS

## 2023-07-17 MED ORDER — GLYCOPYRROLATE PF 0.2 MG/ML IJ SOSY
PREFILLED_SYRINGE | INTRAMUSCULAR | Status: AC
  Filled 2023-07-17: qty 1

## 2023-07-17 MED ORDER — PROPOFOL 10 MG/ML IV BOLUS
INTRAVENOUS | Status: AC
  Filled 2023-07-17: qty 20

## 2023-07-17 MED ORDER — FENTANYL CITRATE PF 50 MCG/ML IJ SOSY
25.0000 ug | PREFILLED_SYRINGE | INTRAMUSCULAR | Status: DC | PRN
Start: 2023-07-17 — End: 2023-07-17
  Administered 2023-07-17: 50 ug via INTRAVENOUS
  Filled 2023-07-17: qty 1

## 2023-07-17 MED ORDER — SUGAMMADEX SODIUM 200 MG/2ML IV SOLN
INTRAVENOUS | Status: DC | PRN
  Administered 2023-07-17: 200 mg via INTRAVENOUS

## 2023-07-17 MED ORDER — CEFAZOLIN SODIUM-DEXTROSE 2-4 GM/100ML-% IV SOLN
INTRAVENOUS | Status: AC
  Filled 2023-07-17: qty 100

## 2023-07-17 MED ORDER — DEXMEDETOMIDINE HCL IN NACL 80 MCG/20ML IV SOLN
INTRAVENOUS | Status: DC | PRN
  Administered 2023-07-17: 8 ug via INTRAVENOUS

## 2023-07-17 MED ORDER — ONDANSETRON HCL 4 MG/2ML IJ SOLN: 4.0000 mg | Freq: Once | INTRAMUSCULAR | Status: DC | PRN

## 2023-07-17 MED ORDER — ROCURONIUM BROMIDE 10 MG/ML (PF) SYRINGE
PREFILLED_SYRINGE | INTRAVENOUS | Status: AC
  Filled 2023-07-17: qty 10

## 2023-07-17 MED ORDER — DEXAMETHASONE SODIUM PHOSPHATE 10 MG/ML IJ SOLN
INTRAMUSCULAR | Status: DC | PRN
  Administered 2023-07-17: 5 mg via INTRAVENOUS

## 2023-07-17 MED ORDER — 0.9 % SODIUM CHLORIDE (POUR BTL) OPTIME
TOPICAL | Status: DC | PRN
  Administered 2023-07-17: 1000 mL

## 2023-07-17 MED ORDER — OXYCODONE HCL 5 MG/5ML PO SOLN: 5.0000 mg | Freq: Once | ORAL | Status: DC | PRN

## 2023-07-17 MED ORDER — BUPIVACAINE-EPINEPHRINE (PF) 0.5% -1:200000 IJ SOLN
INTRAMUSCULAR | Status: DC | PRN
  Administered 2023-07-17: 30 mL

## 2023-07-17 MED ORDER — METOPROLOL TARTRATE 5 MG/5ML IV SOLN
INTRAVENOUS | Status: DC | PRN
  Administered 2023-07-17: 1 mg via INTRAVENOUS
  Administered 2023-07-17 (×2): 2 mg via INTRAVENOUS

## 2023-07-17 MED ORDER — SODIUM CHLORIDE 0.9 % IR SOLN
Status: DC | PRN
  Administered 2023-07-17: 3000 mL

## 2023-07-17 MED ORDER — TRANEXAMIC ACID-NACL 1000-0.7 MG/100ML-% IV SOLN
INTRAVENOUS | Status: AC
  Filled 2023-07-17: qty 100

## 2023-07-17 MED ORDER — PHENYLEPHRINE 80 MCG/ML (10ML) SYRINGE FOR IV PUSH (FOR BLOOD PRESSURE SUPPORT)
PREFILLED_SYRINGE | INTRAVENOUS | Status: DC | PRN
  Administered 2023-07-17: 240 ug via INTRAVENOUS

## 2023-07-17 MED ORDER — PROPOFOL 10 MG/ML IV BOLUS
INTRAVENOUS | Status: DC | PRN
  Administered 2023-07-17: 100 mg via INTRAVENOUS

## 2023-07-17 MED ORDER — LACTATED RINGERS IV SOLN: INTRAVENOUS | Status: DC | PRN

## 2023-07-17 MED ORDER — BUPIVACAINE-EPINEPHRINE (PF) 0.5% -1:200000 IJ SOLN
INTRAMUSCULAR | Status: AC
  Filled 2023-07-17: qty 30

## 2023-07-17 MED ORDER — FENTANYL CITRATE (PF) 100 MCG/2ML IJ SOLN
INTRAMUSCULAR | Status: DC | PRN
  Administered 2023-07-17: 100 ug via INTRAVENOUS
  Administered 2023-07-17: 50 ug via INTRAVENOUS

## 2023-07-17 MED ORDER — ROCURONIUM BROMIDE 10 MG/ML (PF) SYRINGE
PREFILLED_SYRINGE | INTRAVENOUS | Status: DC | PRN
  Administered 2023-07-17: 50 mg via INTRAVENOUS
  Administered 2023-07-17: 10 mg via INTRAVENOUS
  Administered 2023-07-17: 20 mg via INTRAVENOUS
  Administered 2023-07-17: 10 mg via INTRAVENOUS

## 2023-07-17 MED ORDER — PHENYLEPHRINE 80 MCG/ML (10ML) SYRINGE FOR IV PUSH (FOR BLOOD PRESSURE SUPPORT)
PREFILLED_SYRINGE | INTRAVENOUS | Status: AC
  Filled 2023-07-17: qty 10

## 2023-07-17 MED ORDER — LIDOCAINE HCL (PF) 2 % IJ SOLN
INTRAMUSCULAR | Status: DC | PRN
  Administered 2023-07-17: 60 mg via INTRADERMAL

## 2023-07-17 MED ORDER — ONDANSETRON HCL 4 MG/2ML IJ SOLN
INTRAMUSCULAR | Status: AC
  Filled 2023-07-17: qty 2

## 2023-07-17 SURGICAL SUPPLY — 52 items
ANCHOR STAINLESS SURGICAL NEEDLES IMPLANT
ANCHOR SUT MAYO CAT SZ1 1/2 CI (Anchor) IMPLANT
BALL HIP ARTICU 28 +5 (Hips) IMPLANT
BIPOLAR PROS AML 46 (Hips) ×1 IMPLANT
BIT DRILL 2.8X128 (BIT) ×1 IMPLANT
BLADE SAGITTAL 25.0X1.27X90 (BLADE) ×1 IMPLANT
CEMENT BONE 10-PACK (Cement) IMPLANT
CHLORAPREP W/TINT 26 (MISCELLANEOUS) ×1 IMPLANT
CLOTH BEACON ORANGE TIMEOUT ST (SAFETY) ×1 IMPLANT
COUNTER NDL MAGNETIC 40 RED (SET/KITS/TRAYS/PACK) ×1 IMPLANT
COUNTER NEEDLE MAGNETIC 40 RED (SET/KITS/TRAYS/PACK) ×1 IMPLANT
COVER LIGHT HANDLE STERIS (MISCELLANEOUS) ×2 IMPLANT
DRAPE HALF SHEET 40X57 (DRAPES) IMPLANT
DRAPE HIP W/POCKET STRL (MISCELLANEOUS) ×1 IMPLANT
DRAPE U-SHAPE 47X51 STRL (DRAPES) ×1 IMPLANT
DRSG MEPILEX BORDER 4X12 (GAUZE/BANDAGES/DRESSINGS) ×1 IMPLANT
ELECT REM PT RETURN 9FT ADLT (ELECTROSURGICAL) ×1 IMPLANT
ELECTRODE REM PT RTRN 9FT ADLT (ELECTROSURGICAL) ×1 IMPLANT
GLOVE BIOGEL PI IND STRL 7.0 (GLOVE) ×2 IMPLANT
GLOVE SS N UNI LF 8.5 STRL (GLOVE) ×1 IMPLANT
GLOVE SURG POLYISO LF SZ8 (GLOVE) ×2 IMPLANT
GOWN STRL REUS W/TWL LRG LVL3 (GOWN DISPOSABLE) ×2 IMPLANT
GOWN STRL REUS W/TWL XL LVL3 (GOWN DISPOSABLE) ×1 IMPLANT
HEAD BIPOLAR PROS AML 46 (Hips) IMPLANT
HIP BALL ARTICU 28 +5 (Hips) ×1 IMPLANT
INST SET MAJOR BONE (KITS) ×1 IMPLANT
KIT TURNOVER KIT A (KITS) ×1 IMPLANT
MANIFOLD NEPTUNE II (INSTRUMENTS) ×1 IMPLANT
MARKER SKIN DUAL TIP RULER LAB (MISCELLANEOUS) ×1 IMPLANT
NDL HYPO 18GX1.5 BLUNT FILL (NEEDLE) ×1 IMPLANT
NDL HYPO 21X1.5 SAFETY (NEEDLE) ×1 IMPLANT
NEEDLE HYPO 18GX1.5 BLUNT FILL (NEEDLE) ×1 IMPLANT
NEEDLE HYPO 21X1.5 SAFETY (NEEDLE) ×1 IMPLANT
NS IRRIG 1000ML POUR BTL (IV SOLUTION) ×1 IMPLANT
PACK TOTAL JOINT (CUSTOM PROCEDURE TRAY) ×1 IMPLANT
PAD ARMBOARD 7.5X6 YLW CONV (MISCELLANEOUS) ×1 IMPLANT
PIN STMN SNGL STERILE 9X3.6MM (PIN) ×2 IMPLANT
POSITIONER HEAD 8X9X4 ADT (SOFTGOODS) ×1 IMPLANT
SET BASIN LINEN APH (SET/KITS/TRAYS/PACK) ×1 IMPLANT
SET HNDPC FAN SPRY TIP SCT (DISPOSABLE) ×1 IMPLANT
STAPLER SKIN PROX WIDE 3.9 (STAPLE) ×1 IMPLANT
STEM FEM ACTIS STD SZ4 (Stem) IMPLANT
SUT BRALON NAB BRD #1 30IN (SUTURE) ×2 IMPLANT
SUT ETHIBOND 5 LR DA (SUTURE) ×2 IMPLANT
SUT MNCRL 0 VIOLET CTX 36 (SUTURE) ×1 IMPLANT
SUT MON AB 0 CT1 (SUTURE) ×1 IMPLANT
SUT VIC AB 1 CT1 27XBRD ANTBC (SUTURE) ×4 IMPLANT
SYR 30ML LL (SYRINGE) ×1 IMPLANT
SYR BULB IRRIG 60ML STRL (SYRINGE) ×1 IMPLANT
TRAY FOLEY MTR SLVR 16FR STAT (SET/KITS/TRAYS/PACK) ×1 IMPLANT
WATER STERILE IRR 1000ML POUR (IV SOLUTION) ×2 IMPLANT
YANKAUER SUCT 12FT TUBE ARGYLE (SUCTIONS) ×1 IMPLANT

## 2023-07-17 NOTE — Anesthesia Procedure Notes (Signed)
Procedure Name: Intubation Date/Time: 07/17/2023 1:25 PM  Performed by: Shanon Payor, CRNAPre-anesthesia Checklist: Patient identified, Emergency Drugs available, Suction available, Patient being monitored and Timeout performed Patient Re-evaluated:Patient Re-evaluated prior to induction Oxygen Delivery Method: Circle system utilized Preoxygenation: Pre-oxygenation with 100% oxygen Induction Type: IV induction Ventilation: Mask ventilation without difficulty Laryngoscope Size: Mac, 3 and Glidescope Grade View: Grade I Tube type: Oral Tube size: 7.0 mm Number of attempts: 1 Airway Equipment and Method: Rigid stylet Placement Confirmation: ETT inserted through vocal cords under direct vision, CO2 detector, positive ETCO2 and breath sounds checked- equal and bilateral Secured at: 22 cm Tube secured with: Tape Dental Injury: Teeth and Oropharynx as per pre-operative assessment  Comments: DL with MAC 3 blade, unable to visualize cords due to dried secretions that was not dislodged with suctioning. Glidescope mac 3 used, able to visualize cords, intubated without difficulty.

## 2023-07-17 NOTE — Op Note (Signed)
Orthopaedic Surgery Operative Note (CSN: [id number])  Virginia [name]  [birth year] Date of Surgery: 07/17/2023   Diagnoses:  left hip fracture angulated left femoral neck fracture  Procedure: Bipolar partial left hip replacement   TXA [used/not used] used   Operative Finding Angulated and impacted left femoral neck fracture   Post-Op Diagnosis: Same Surgeons:Primary: Vickki Hearing, MD Assistants: Rebeca Alert Location: [facility] ROOM 3 Anesthesia: General Antibiotics: Ancef  Estimated Blood Loss: 100 cc Complications: None Specimens: None   Implants: Implant Name Type Inv. Item Serial No. Manufacturer Lot No. LRB No. Used Action  HIP BALL ARTICU 28 +5 - [id number] Hips HIP BALL ARTICU 28 +5  [employer] [id number] Left 1 Implanted  STEM FEM ACTIS STD SZ4 - [id number] Stem STEM FEM ACTIS STD SZ4  [employer]PU[employer] [id number] Left 1 Implanted  BIPOLAR PROS AML 46 - [id number] Hips BIPOLAR PROS AML 46  [employer] [id number] Left 1 Implanted     Indication for surgery left hip fracture  Transexamic  acid was given yes   DRAINS: none   LOCAL MEDICATIONS USED: Marcaine   SPECIMEN:  No Specimen  DISPOSITION OF SPECIMEN:  N/A  COUNTS: correct  DICTATION: .Dragon Dictation   The patient was taken to the recovery room in stable condition  PLAN OF CARE:   PATIENT DISPOSITION:  PACU - hemodynamically stable.   Delay start of Pharmacological VTE agent (>24hrs) due to surgical blood loss or risk of bleeding: Not applicable  Details of surgery: The patient was identified by 2 approved identification mechanisms. The operative extremity was evaluated and found to be acceptable for surgical treatment today. The chart was reviewed. The surgical site was confirmed initials were placed on the left hip  The patient was taken to the operating room and given 2 g Ancef for antibiotics. This is consistent with the SCIP protocol.  The patient was given the following  anesthetic: As stated General  Foley catheter insertion was completed  The patient was then placed in the lateral decubitus position with appropriate padding. The surgical site was prepped and draped sterilely.  Timeout was executed confirming the patient's name, surgical site, antibiotic administration, x-rays available, and implants were checked and were available.  Incision was made over the greater trochanter extended proximally and distally approximately 3 cm  Subcutaneous tissue was divided down to fascia which was then split in line with the skin incision and deep retractors were placed.  The greater trochanteric bursa was resected exposing the abductors   The gluteus medius anterior half was subperiosteally dissected from the greater trochanter and tagged with #1 Vicryl sutures  The underlying gluteus minimus was split in continuity with the capsule and preserved tagging with Vicryl sutures as well   Remaining anterior capsule was excised.   The femoral head was removed and measured 46  The acetabular was inspected: In the weightbearing region there was an area of arthritis grade 4 the remaining acetabulum was normal  The hip was dislocated anteriorly into a sterile bag  Proximal femur was prepared starting with a femoral neck cutting guide, box osteotome, and further preparation per manufacture technique  Broaching was started with a size 1 and broached up to appropriate size based on proximal fit and fill.  This was a size 4  Trial reduction was then performed using a 4 stem +5 standard offset head and neck with 46 diameter  Trial reduction we found the hip to be stable   The trial implants were then  removed 3 drill holes were placed in the greater trochanter and TWO #5 Ethibond was passed through the drill holes   The acetabulum was irrigated and cleaned of any bony debris, the  implants were placed and the hip was reduced  The hip was stable throughout the range of  motion.    Hip flexion  Hip extension with external rotation  Sleep position stress test  Shuck test  Leg lengths  Local anesthetic  was injected in the soft tissues including the vastus lateralis, subcutaneous tissue, and the abductors were repaired using the #5 Ethibond and then oversewn with #1 Braylon  The hip was then abducted the fascia was closed with #1 Braylon  Subcutaneous tissues were closed with 0 Monocryl in 2 layers and then staples  A sterile dressing was applied  The patient was taken recovery room in stable condition   Postop plan  Weightbearing as tolerated Direct lateral hip precautions DVT prophylaxis for 42 days Remove staples at 12 to 14 days Postop appointment scheduled for 28 days  [id number]

## 2023-07-17 NOTE — Progress Notes (Signed)
Progress Note   Patient: Virginia [name] YQM:[id number] DOB: 1941/03/01 DOA: 07/15/2023     1 DOS: the patient was seen and examined on 07/17/2023   Brief hospital admission course: As per H&P written by Dr. Mariea Clonts on 07/16/2023 Virginia [name]  is a 83 y.o. female with past medical history relevant for chronic atrial fibrillation on apixaban, GERD with prior history of GI bleed, CAD--post 3 Vessel CABG in 1999, DM2, chronic anemia, depression/anxiety, she osteoarthritis of the knees and HTN presents to the ED with left knee and left hip pain after falling at home -Denies loss of consciousness -Patient reports multiple falls over the last 3 weeks or so--- she has had ambulatory dysfunction partly due to significant left hip pain--- she gets around with a wheelchair at times -Patient denies chest pain palpitations or dizziness prior to falling or after falling, -EKG sinus rhythm -Chest x-ray without acute findings -CT of the left hip without contrast shows----IMPRESSION: 1. Acute impacted fracture of the left femoral neck. 2. Remote healed fractures of the left superior and inferior pubic Rami - Knee x-rays without fractures -BMP with a creatinine of 0.67 potassium is 4.0 bicarb is 28 -CBC with a white count of 7.7 hemoglobin 15.8 and platelets of 143 -UA suggestive of UTI--- urine culture pending  Assessment and Plan: 1-left hip fracture/acute impacted fracture of the left femoral neck -Continue n.p.o. status and follow recommendation by orthopedic service -Planning for surgical repair later today -Continue holding anticoagulation therapy. -Continue as needed analgesics.  2-presumed UTI -Urinalysis suggestive of urinary tract infection -No fever, no leukocytosis -Patient reports no dysuria -Continue holding on antibiotics at the moment -Follow urine culture results.  3-chronic anemia -Continue iron supplementation -Continue to follow hemoglobin trend -No overt bleeding currently  appreciated -Patient with prior history of GI bleed.  4-gastroesophageal reflux disease -Continue PPI.  5-coronary artery disease -Status post three-vessel CABG in 1999 -Patient denies any chest pain -Continue Lipitor, metoprolol for secondary prevention -No aspirin as patient is on Eliquis for A-fib stroke prophylaxis.  6-history of atrial fibrillation -Currently holding Eliquis -Rate controlled.  7-essential hypertension -Continue current antihypertensive agent -Patient blood pressure slightly elevated most likely associated with ongoing pain. -Continue to monitor vital signs.   Subjective:  No chest pain, no nausea, no vomiting.  Complaining of left hip pain.  Physical Exam: Vitals:   07/17/23 1545 07/17/23 1557 07/17/23 1600 07/17/23 1628  BP: (!) 156/76 (!) 158/91 (!) 157/88 (!) 173/95  Pulse: (!) 56 (!) 106 100 98  Resp: 10 (!) 22 (!) 7 14  Temp:  98.8 F (37.1 C)  98.5 F (36.9 C)  TempSrc:    Oral  SpO2: 98% 95% 94%   Weight:      Height:       General exam: Alert, awake, following commands appropriately.  Afebrile, no nausea, no vomiting.  Complaining of left hip pain. Respiratory system: Good saturation on room air. Cardiovascular system: Rate controlled, no rubs, no gallops, no JVD. Gastrointestinal system: Abdomen is nondistended, soft and nontender. No organomegaly or masses felt. Normal bowel sounds heard. Central nervous system: No focal neurological deficits. Extremities: No cyanosis or clubbing. Skin: No petechiae. Psychiatry: Mood & affect appropriate.   Data Reviewed: Basic metabolic panel: Sodium 137, potassium 3.5, chloride 101, bicarb 27, BUN 24, creatinine 0.61 and GFR >60 CBC: WBCs 8.5, hemoglobin 14.9 and platelet count 1 46K.  Family Communication: Son at bedside.  Disposition: Status is: Inpatient Remains inpatient appropriate because: Continue treatment for  left hip fracture. Follow surgical repair recommendations by orthopedic  service.   Planned Discharge Destination: to be determined  Time spent: 50 minutes  Author: Vassie Loll, MD 07/17/2023 6:22 PM  For on call review [email or web address].

## 2023-07-17 NOTE — Anesthesia Preprocedure Evaluation (Signed)
Anesthesia Evaluation  Patient identified by MRN, date of birth, ID band Patient awake and Patient confused    Reviewed: Allergy & Precautions, H&P , NPO status , Patient's Chart, lab work & pertinent test results, reviewed documented beta blocker date and time , Unable to perform ROS - Chart review only  Airway Mallampati: II  TM Distance: >3 FB Neck ROM: full    Dental no notable dental hx.    Pulmonary neg pulmonary ROS   Pulmonary exam normal breath sounds clear to auscultation       Cardiovascular Exercise Tolerance: Good hypertension, negative cardio ROS  Rhythm:regular Rate:Normal     Neuro/Psych negative neurological ROS  negative psych ROS   GI/Hepatic negative GI ROS, Neg liver ROS,,,  Endo/Other  negative endocrine ROSdiabetes    Renal/GU negative Renal ROS  negative genitourinary   Musculoskeletal   Abdominal   Peds  Hematology negative hematology ROS (+)   Anesthesia Other Findings   Reproductive/Obstetrics negative OB ROS                             Anesthesia Physical Anesthesia Plan  ASA: 3  Anesthesia Plan: General and General ETT   Post-op Pain Management:    Induction:   PONV Risk Score and Plan: Ondansetron  Airway Management Planned:   Additional Equipment:   Intra-op Plan:   Post-operative Plan:   Informed Consent: I have reviewed the patients History and Physical, chart, labs and discussed the procedure including the risks, benefits and alternatives for the proposed anesthesia with the patient or authorized representative who has indicated his/her understanding and acceptance.     Dental Advisory Given  Plan Discussed with: CRNA  Anesthesia Plan Comments:        Anesthesia Quick Evaluation

## 2023-07-17 NOTE — TOC Progression Note (Signed)
Transition of Care [facility]) - Progression Note    Patient Details  Name: Virginia [name] MRN: [id number] Date of Birth: 11-10-1940  Transition of Care [facility]) CM/SW Contact  Leitha Bleak, RN Phone Number: 07/17/2023, 3:55 PM  Clinical Narrative:   Patient in OR, admitted with left displaced femoral neck fracture. PT eval pending. TOC following.      Barriers to Discharge: Continued Medical Work up  Expected Discharge Plan and Services    Social Determinants of Health (SDOH) Interventions SDOH Screenings   Food Insecurity: No Food Insecurity (07/16/2023)  Housing: Low Risk  (07/16/2023)  Transportation Needs: No Transportation Needs (07/16/2023)  Utilities: Not At Risk (07/16/2023)  Alcohol Screen: Low Risk  (01/23/2023)  Depression (PHQ2-9): Medium Risk (06/03/2023)  Financial Resource Strain: Low Risk  (01/23/2023)  Physical Activity: Insufficiently Active (01/23/2023)  Social Connections: Moderately Isolated (07/16/2023)  Stress: No Stress Concern Present (01/23/2023)  Tobacco Use: Low Risk  (07/17/2023)  Health Literacy: Adequate Health Literacy (01/23/2023)

## 2023-07-17 NOTE — Interval H&P Note (Signed)
History and Physical Interval Note:  07/17/2023 1:01 PM  Virginia [name]  has presented today for surgery, with the diagnosis of left hip fracture.  The various methods of treatment have been discussed with the patient and family. After consideration of risks, benefits and other options for treatment, the patient has consented to  Procedure(s): ARTHROPLASTY BIPOLAR HIP (HEMIARTHROPLASTY) (Left) as a surgical intervention.  The patient's history has been reviewed, patient examined, no change in status, stable for surgery.  I have reviewed the patient's chart and labs.  Questions were answered to the patient's satisfaction.     Fuller Canada

## 2023-07-17 NOTE — Transfer of Care (Signed)
Immediate Anesthesia Transfer of Care Note  Patient: Virginia [name]  Procedure(s) Performed: ARTHROPLASTY BIPOLAR HIP (HEMIARTHROPLASTY) (Left: Hip)  Patient Location: PACU  Anesthesia Type:General  Level of Consciousness: awake  Airway & Oxygen Therapy: Patient Spontanous Breathing and Patient connected to face mask oxygen  Post-op Assessment: Report given to RN and Post -op Vital signs reviewed and stable  Post vital signs: Reviewed and stable  Last Vitals:  Vitals Value Taken Time  BP 176/92   Temp 98.6   Pulse 90 07/17/23 1524  Resp 20 07/17/23 1524  SpO2 100 % 07/17/23 1524  Vitals shown include unfiled device data.  Last Pain:  Vitals:   07/17/23 1104  TempSrc: Oral  PainSc:       Patients Stated Pain Goal: 4 (07/17/23 1104)  Complications: No notable events documented.

## 2023-07-17 NOTE — Brief Op Note (Signed)
07/17/2023  3:18 PM  PATIENT:  Virginia [name]  83 y.o. female  PRE-OPERATIVE DIAGNOSIS:  left hip fracture  POST-OPERATIVE DIAGNOSIS:  left hip fracture  PROCEDURE:  Procedure(s): ARTHROPLASTY BIPOLAR HIP (HEMIARTHROPLASTY) (Left)  SURGEON:  Surgeons and Role:    Vickki Hearing, MD - Primary  PHYSICIAN ASSISTANT:   ASSISTANTS: SUNNY SMITH    ANESTHESIA:   general  EBL:  100 mL   BLOOD ADMINISTERED:none  DRAINS: none   LOCAL MEDICATIONS USED:  MARCAINE     SPECIMEN:  No Specimen  DISPOSITION OF SPECIMEN:  N/A  COUNTS:  YES  TOURNIQUET:  * No tourniquets in log *  DICTATION: .Dragon Dictation  PLAN OF CARE: Admit to inpatient   PATIENT DISPOSITION:  PACU - hemodynamically stable.   Delay start of Pharmacological VTE agent (>24hrs) due to surgical blood loss or risk of bleeding: not applicable

## 2023-07-18 ENCOUNTER — Encounter (HOSPITAL_COMMUNITY): Payer: Self-pay | Admitting: Orthopedic Surgery

## 2023-07-18 DIAGNOSIS — K219 Gastro-esophageal reflux disease without esophagitis: Secondary | ICD-10-CM | POA: Diagnosis not present

## 2023-07-18 DIAGNOSIS — G250 Essential tremor: Secondary | ICD-10-CM | POA: Diagnosis not present

## 2023-07-18 DIAGNOSIS — S72002A Fracture of unspecified part of neck of left femur, initial encounter for closed fracture: Secondary | ICD-10-CM | POA: Diagnosis not present

## 2023-07-18 DIAGNOSIS — I482 Chronic atrial fibrillation, unspecified: Secondary | ICD-10-CM | POA: Diagnosis not present

## 2023-07-18 LAB — URINE CULTURE: Culture: >=100000 — AB

## 2023-07-18 LAB — CBC
HCT: 43.3 % (ref 36.0–46.0)
Hemoglobin: 14.6 g/dL (ref 12.0–15.0)
MCH: 33 pg (ref 26.0–34.0)
MCHC: 33.7 g/dL (ref 30.0–36.0)
MCV: 97.7 fL (ref 80.0–100.0)
Platelets: 150 10*3/uL (ref 150–400)
RBC: 4.43 MIL/uL (ref 3.87–5.11)
RDW: 12.8 % (ref 11.5–15.5)
WBC: 11.6 10*3/uL — ABNORMAL HIGH (ref 4.0–10.5)
nRBC: 0 % (ref 0.0–0.2)

## 2023-07-18 LAB — BASIC METABOLIC PANEL
Anion gap: 11 (ref 5–15)
BUN: 21 mg/dL (ref 8–23)
CO2: 24 mmol/L (ref 22–32)
Calcium: 9.5 mg/dL (ref 8.9–10.3)
Chloride: 102 mmol/L (ref 98–111)
Creatinine, Ser: 0.66 mg/dL (ref 0.44–1.00)
GFR, Estimated: >60 mL/min (ref >60)
Glucose, Bld: 196 mg/dL — ABNORMAL HIGH (ref 70–99)
Potassium: 4.4 mmol/L (ref 3.5–5.1)
Sodium: 137 mmol/L (ref 135–145)

## 2023-07-18 MED ORDER — DOCUSATE SODIUM 100 MG PO CAPS
100.0000 mg | ORAL_CAPSULE | Freq: Two times a day (BID) | ORAL | Status: DC
  Administered 2023-07-18 – 2023-07-22 (×9): 100 mg via ORAL
  Filled 2023-07-18 (×9): qty 1

## 2023-07-18 MED ORDER — HYDROCODONE-ACETAMINOPHEN 7.5-325 MG PO TABS
1.0000 | ORAL_TABLET | ORAL | Status: DC | PRN
  Administered 2023-07-19 – 2023-07-21 (×2): 1 via ORAL
  Filled 2023-07-18 (×2): qty 1

## 2023-07-18 MED ORDER — METOCLOPRAMIDE HCL 10 MG PO TABS: 5.0000 mg | ORAL_TABLET | Freq: Three times a day (TID) | ORAL | Status: DC | PRN

## 2023-07-18 MED ORDER — APIXABAN 5 MG PO TABS
5.0000 mg | ORAL_TABLET | Freq: Two times a day (BID) | ORAL | Status: DC
  Administered 2023-07-18 – 2023-07-22 (×8): 5 mg via ORAL
  Filled 2023-07-18 (×8): qty 1

## 2023-07-18 MED ORDER — PHENOL 1.4 % MT LIQD: 1.0000 | OROMUCOSAL | Status: DC | PRN

## 2023-07-18 MED ORDER — ENOXAPARIN SODIUM 30 MG/0.3ML IJ SOSY
30.0000 mg | PREFILLED_SYRINGE | INTRAMUSCULAR | Status: DC
  Administered 2023-07-18: 30 mg via SUBCUTANEOUS
  Filled 2023-07-18: qty 0.3

## 2023-07-18 MED ORDER — MORPHINE SULFATE (PF) 2 MG/ML IV SOLN: 0.5000 mg | INTRAVENOUS | Status: DC | PRN

## 2023-07-18 MED ORDER — CEFAZOLIN SODIUM-DEXTROSE 2-4 GM/100ML-% IV SOLN
2.0000 g | Freq: Four times a day (QID) | INTRAVENOUS | Status: AC
  Administered 2023-07-18 (×2): 2 g via INTRAVENOUS
  Filled 2023-07-18 (×2): qty 100

## 2023-07-18 MED ORDER — ONDANSETRON HCL 4 MG/2ML IJ SOLN
4.0000 mg | Freq: Four times a day (QID) | INTRAMUSCULAR | Status: DC | PRN
Start: 2023-07-18 — End: 2023-07-22

## 2023-07-18 MED ORDER — METOCLOPRAMIDE HCL 5 MG/ML IJ SOLN: 5.0000 mg | Freq: Three times a day (TID) | INTRAMUSCULAR | Status: DC | PRN

## 2023-07-18 MED ORDER — TRAMADOL HCL 50 MG PO TABS
50.0000 mg | ORAL_TABLET | Freq: Four times a day (QID) | ORAL | Status: DC
  Administered 2023-07-18 – 2023-07-22 (×14): 50 mg via ORAL
  Filled 2023-07-18 (×15): qty 1

## 2023-07-18 MED ORDER — MENTHOL 3 MG MT LOZG: 1.0000 | LOZENGE | OROMUCOSAL | Status: DC | PRN

## 2023-07-18 MED ORDER — HYDROCODONE-ACETAMINOPHEN 5-325 MG PO TABS
1.0000 | ORAL_TABLET | ORAL | Status: DC | PRN
  Administered 2023-07-19 – 2023-07-20 (×2): 2 via ORAL
  Filled 2023-07-18: qty 1
  Filled 2023-07-18 (×2): qty 2

## 2023-07-18 MED ORDER — TRANEXAMIC ACID-NACL 1000-0.7 MG/100ML-% IV SOLN: 1000.0000 mg | Freq: Once | INTRAVENOUS | Status: DC

## 2023-07-18 MED ORDER — SODIUM CHLORIDE 0.9 % IV SOLN: INTRAVENOUS | Status: AC

## 2023-07-18 MED ORDER — ONDANSETRON HCL 4 MG PO TABS
4.0000 mg | ORAL_TABLET | Freq: Four times a day (QID) | ORAL | Status: DC | PRN
  Administered 2023-07-20: 4 mg via ORAL
  Filled 2023-07-18: qty 1

## 2023-07-18 NOTE — Care Management Important Message (Signed)
Important Message  Patient Details  Name: Virginia [name] MRN: [id number] Date of Birth: 1940/11/24   Important Message Given:  N/A - LOS <3 / Initial given by admissions     Corey Harold 07/18/2023, 10:58 AM

## 2023-07-18 NOTE — Progress Notes (Signed)
Subjective: 1 Day Post-Op Procedure(s) (LRB): ARTHROPLASTY BIPOLAR HIP (HEMIARTHROPLASTY) (Left)   Objective: Vital signs in last 24 hours: Temp:  [97.5 F (36.4 C)-98.8 F (37.1 C)] 98.6 F (37 C) (01/30 0420) Pulse Rate:  [56-115] 88 (01/30 0420) Resp:  [0-39] 16 (01/30 0420) BP: (130-176)/(61-95) 160/70 (01/30 0420) SpO2:  [87 %-98 %] 96 % (01/30 0420)  Intake/Output from previous day: 01/29 0701 - 01/30 0700 In: 856 [I.V.:656; IV Piggyback:200] Out: 1050 [Urine:850; Blood:200] Intake/Output this shift: Total I/O In: -  Out: 250 [Urine:250]  Recent Labs    07/15/23 2224 07/17/23 0349 07/18/23 0809  HGB 15.8* 14.9 14.6   Recent Labs    07/17/23 0349 07/18/23 0809  WBC 8.5 11.6*  RBC 4.64 4.43  HCT 44.9 43.3  PLT 146* 150   Recent Labs    07/17/23 0349 07/18/23 0809  NA 137 137  K 3.5 4.4  CL 101 102  CO2 27 24  BUN 24* 21  CREATININE 0.61 0.66  GLUCOSE 139* 196*  CALCIUM 9.5 9.5   No results for input(s): "LABPT", "INR" in the last 72 hours.     Assessment/Plan: 1 Day Post-Op Procedure(s) (LRB): ARTHROPLASTY BIPOLAR HIP (HEMIARTHROPLASTY) (Left) Advance diet Up with therapy D/C IV fluids Discharge to SNF  DVT SHOULD BE OK WITH ELIQUIS   ORTHO FU IN 14 DAYS   WB AS TOLERATED       Fuller Canada 07/18/2023, 9:19 AM

## 2023-07-18 NOTE — Progress Notes (Signed)
Progress Note   Patient: Virginia [name] ZOX:[id number] DOB: December 26, 1940 DOA: 07/15/2023     2 DOS: the patient was seen and examined on 07/18/2023   Brief hospital admission course: As per H&P written by Dr. Mariea Clonts on 07/16/2023 Tarri Guilfoil  is a 83 y.o. female with past medical history relevant for chronic atrial fibrillation on apixaban, GERD with prior history of GI bleed, CAD--post 3 Vessel CABG in 1999, DM2, chronic anemia, depression/anxiety, she osteoarthritis of the knees and HTN presents to the ED with left knee and left hip pain after falling at home -Denies loss of consciousness -Patient reports multiple falls over the last 3 weeks or so--- she has had ambulatory dysfunction partly due to significant left hip pain--- she gets around with a wheelchair at times -Patient denies chest pain palpitations or dizziness prior to falling or after falling, -EKG sinus rhythm -Chest x-ray without acute findings -CT of the left hip without contrast shows----IMPRESSION: 1. Acute impacted fracture of the left femoral neck. 2. Remote healed fractures of the left superior and inferior pubic Rami  Knee x-rays without fractures -BMP with a creatinine of 0.67 potassium is 4.0 bicarb is 28 -CBC with a white count of 7.7 hemoglobin 15.8 and platelets of 143 -UA suggestive of UTI--- urine culture pending  Assessment and Plan: 1-left hip fracture/acute impacted fracture of the left femoral neck -Patient underwent Bipolar partial left hip replacement by Orthopedic service on 07/17/23; patient tolerated procedure well. -Diet and DVT prophylaxis has been started -Continue as needed analgesics -Will wait for PT evaluation and recommendations; most likely patient will benefit from SNF at discharge -Continue incentive spirometry  2-presumed UTI -Urinalysis suggestive of urinary tract infection -No fever, no leukocytosis -Patient reports no dysuria -Continue holding on antibiotics at the  moment -Follow urine culture results.  3-chronic anemia -Continue iron supplementation -Continue to follow hemoglobin trend; at 14.6 mg/dl post op -No overt bleeding currently appreciated -Patient with prior history of GI bleed.  4-gastroesophageal reflux disease -Continue PPI.  5-coronary artery disease -Status post three-vessel CABG in 1999 -Patient denies any chest pain -Continue Lipitor, metoprolol for secondary prevention -No aspirin as patient is on Eliquis for A-fib stroke prophylaxis. -Eliquis restarted after surgery  6-history of atrial fibrillation -Eliquis restarted after surgery -Rate controlled.  7-essential hypertension -Continue current antihypertensive agent -Patient blood pressure slightly elevated most likely associated with ongoing pain. -Continue to monitor vital signs.   Subjective:  No chest pain, no nausea, no vomiting, no hip pain while non-mobile.   Physical Exam: Vitals:   07/17/23 1600 07/17/23 1628 07/17/23 1900 07/18/23 0420  BP: (!) 157/88 (!) 173/95 130/73 (!) 160/70  Pulse: 100 98 89 88  Resp: (!) 7 14 18 16   Temp:  98.5 F (36.9 C) 97.7 F (36.5 C) 98.6 F (37 C)  TempSrc:  Oral Oral Axillary  SpO2: 94%  93% 96%  Weight:      Height:       General exam: Alert, awake, following commands. Afebrile, no nausea, no vomiting. Currently not complaining of left hip pain.  Respiratory system: Clear to auscultation. Respiratory effort normal.  Saturation on room air. Cardiovascular system: regular rate and rhythm. No murmurs, rubs, gallops. No JVD. Gastrointestinal system: Abdomen is nondistended, soft and nontender. No organomegaly or masses felt. Normal bowel sounds heard. Central nervous system: No focal neurological deficits. Extremities: No cyanosis or clubbing. +pedal pulses, surgical site without erythema or signs of infection Skin: No rashes, lesions or ulcers Psychiatry: Mood &  affect appropriate.     Data Reviewed: Basic  metabolic panel: Sodium 137, potassium 4.4, chloride 102, bicarb 24, BUN 21, creatinine 0.66 and GFR >60 CBC: WBCs 11.6, hemoglobin 14.6 and platelet count 150K.  Family Communication: granddaughter at bedside  Disposition: Status is: Inpatient Remains inpatient appropriate because: Continue treatment for left hip fracture. Follow surgical repair recommendations by orthopedic service.   Planned Discharge Destination: to be determine; anticipating discharge to skilled nursing facility.  Time spent: 50 minutes  Author: Vassie Loll, MD 07/18/2023 1:23 PM  For on call review [email or web address].

## 2023-07-18 NOTE — Plan of Care (Signed)
Problem: Clinical Measurements: Goal: Will remain free from infection Outcome: Progressing Goal: Diagnostic test results will improve Outcome: Progressing   Problem: Safety: Goal: Ability to remain free from injury will improve Outcome: Progressing   Problem: Skin Integrity: Goal: Risk for impaired skin integrity will decrease Outcome: Progressing

## 2023-07-18 NOTE — Plan of Care (Signed)
  Problem: Acute Rehab PT Goals(only PT should resolve) Goal: Pt Will Go Supine/Side To Sit Outcome: Progressing Flowsheets (Taken 07/18/2023 1353) Pt will go Supine/Side to Sit: with moderate assist Goal: Patient Will Transfer Sit To/From Stand Outcome: Progressing Flowsheets (Taken 07/18/2023 1353) Patient will transfer sit to/from stand:  with moderate assist  with minimal assist Goal: Pt Will Transfer Bed To Chair/Chair To Bed Outcome: Progressing Flowsheets (Taken 07/18/2023 1353) Pt will Transfer Bed to Chair/Chair to Bed:  with min assist  with mod assist Goal: Pt Will Ambulate Outcome: Progressing Flowsheets (Taken 07/18/2023 1353) Pt will Ambulate:  15 feet  with moderate assist  with rolling walker   1:54 PM, 07/18/23 Ocie Bob, MPT Physical Therapist with [facility] 336 [phone] office [phone] mobile phone

## 2023-07-18 NOTE — Evaluation (Signed)
Physical Therapy Evaluation Patient Details Name: Virginia [name] MRN: [id number] DOB: 04-05-1941 Today's Date: 07/18/2023  History of Present Illness  Virginia [name]  is a 83 y.o. female with past medical history relevant for chronic atrial fibrillation on apixaban, GERD with prior history of GI bleed, CAD--post 3 Vessel CABG in 1999, DM2, chronic anemia, depression/anxiety, she osteoarthritis of the knees and HTN presents to the ED with left knee and left hip pain after falling at home  -Denies loss of consciousness  -Patient reports multiple falls over the last 3 weeks or so--- she has had ambulatory dysfunction partly due to significant left hip pain--- she gets around with a wheelchair at times  -Patient denies chest pain palpitations or dizziness prior to falling or after falling,   Clinical Impression  Patient demonstrates slow labored movement for sitting up at bedside with c/o severe pain with movement of BLE, once seated had difficulty flexing right knee requiring manual assistance to keep right foot behind knee for completing sit to stands with Mod/max assist and limited to a few slow labored side steps with buckling of knees due to pain/weakness during transfer to chair.  Patient tolerated sitting up in chair after therapy with family members present.  Patient will benefit from continued skilled physical therapy in hospital and recommended venue below to increase strength, balance, endurance for safe ADLs and gait.           If plan is discharge home, recommend the following: A lot of help with bathing/dressing/bathroom;A lot of help with walking and/or transfers;Help with stairs or ramp for entrance;Assistance with cooking/housework   Can travel by private vehicle   No    Equipment Recommendations None recommended by PT  Recommendations for Other Services       Functional Status Assessment Patient has had a recent decline in their functional status and demonstrates the ability to  make significant improvements in function in a reasonable and predictable amount of time.     Precautions / Restrictions Precautions Precautions: Fall Restrictions Weight Bearing Restrictions Per Provider Order: Yes LLE Weight Bearing Per Provider Order: Weight bearing as tolerated      Mobility  Bed Mobility Overal bed mobility: Needs Assistance Bed Mobility: Supine to Sit     Supine to sit: Mod assist, Max assist     General bed mobility comments: increased time, labored movement    Transfers Overall transfer level: Needs assistance Equipment used: Rolling walker (2 wheels) Transfers: Sit to/from Stand, Bed to chair/wheelchair/BSC Sit to Stand: Mod assist, Max assist   Step pivot transfers: Mod assist, Max assist       General transfer comment: unsteady labored movement with poor tolerance for weight bearing on LLE and c/o severe pain BLE    Ambulation/Gait Ambulation/Gait assistance: Max assist Gait Distance (Feet): 3 Feet Assistive device: Rolling walker (2 wheels) Gait Pattern/deviations: Decreased step length - left, Decreased stance time - right, Decreased stride length, Trunk flexed, Antalgic, Knees buckling Gait velocity: slow     General Gait Details: limited to a few slow labored side steps with buckling of knees and poor standing balance  Stairs            Wheelchair Mobility     Tilt Bed    Modified Rankin (Stroke Patients Only)       Balance Overall balance assessment: Needs assistance Sitting-balance support: Feet supported, No upper extremity supported Sitting balance-Leahy Scale: Fair Sitting balance - Comments: seated at EOB   Standing balance support:  Reliant on assistive device for balance, During functional activity, Bilateral upper extremity supported Standing balance-Leahy Scale: Poor Standing balance comment: using RW                             Pertinent Vitals/Pain Pain Assessment Pain Assessment:  Faces Faces Pain Scale: Hurts whole lot Pain Location: left hip Pain Descriptors / Indicators: Discomfort, Grimacing, Guarding, Sharp, Moaning Pain Intervention(s): Limited activity within patient's tolerance, Monitored during session, Premedicated before session, Repositioned    Home Living Family/patient expects to be discharged to:: Private residence Living Arrangements: Children Available Help at Discharge: Family;Available PRN/intermittently Type of Home: House Home Access: Stairs to enter Entrance Stairs-Rails: None Entrance Stairs-Number of Steps: 2 Alternate Level Stairs-Number of Steps: Patient does not go upstairs Home Layout: Two level;Able to live on main level with bedroom/bathroom;Full bath on main level Home Equipment: Control and instrumentation engineer (2 wheels)      Prior Function Prior Level of Function : Needs assist       Physical Assist : Mobility (physical);ADLs (physical) Mobility (physical): Bed mobility;Transfers;Gait;Stairs   Mobility Comments: household ambulation using RW, uses electric carts in grocery stores, does not drive ADLs Comments: Independent household, assisted for community     Extremity/Trunk Assessment   Upper Extremity Assessment Upper Extremity Assessment: Generalized weakness    Lower Extremity Assessment Lower Extremity Assessment: Generalized weakness;LLE deficits/detail LLE Deficits / Details: grossly -3/5 LLE: Unable to fully assess due to pain LLE Sensation: WNL LLE Coordination: WNL    Cervical / Trunk Assessment Cervical / Trunk Assessment: Normal  Communication   Communication Communication: Difficulty communicating thoughts/reduced clarity of speech Cueing Techniques: Tactile cues;Verbal cues  Cognition Arousal: Alert Behavior During Therapy: Flat affect, Anxious Overall Cognitive Status: History of cognitive impairments - at baseline                                          General  Comments      Exercises     Assessment/Plan    PT Assessment Patient needs continued PT services  PT Problem List Decreased strength;Decreased activity tolerance;Decreased balance;Decreased mobility       PT Treatment Interventions DME instruction;Gait training;Stair training;Functional mobility training;Therapeutic activities;Therapeutic exercise;Balance training;Patient/family education    PT Goals (Current goals can be found in the Care Plan section)  Acute Rehab PT Goals Patient Stated Goal: return home PT Goal Formulation: With patient/family Time For Goal Achievement: 08/01/23 Potential to Achieve Goals: Good    Frequency Min 3X/week     Co-evaluation               AM-PAC PT "6 Clicks" Mobility  Outcome Measure Help needed turning from your back to your side while in a flat bed without using bedrails?: A Lot Help needed moving from lying on your back to sitting on the side of a flat bed without using bedrails?: A Lot Help needed moving to and from a bed to a chair (including a wheelchair)?: A Lot Help needed standing up from a chair using your arms (e.g., wheelchair or bedside chair)?: A Lot Help needed to walk in hospital room?: A Lot Help needed climbing 3-5 steps with a railing? : Total 6 Click Score: 11    End of Session   Activity Tolerance: Patient tolerated treatment well;Patient limited by fatigue;Patient limited by pain Patient left: in  chair;with call bell/phone within reach;with family/visitor present Nurse Communication: Mobility status PT Visit Diagnosis: Unsteadiness on feet (R26.81);Other abnormalities of gait and mobility (R26.89);Muscle weakness (generalized) (M62.81)    Time: 1610-9604 PT Time Calculation (min) (ACUTE ONLY): 29 min   Charges:   PT Evaluation $PT Eval Moderate Complexity: 1 Mod PT Treatments $Therapeutic Activity: 23-37 mins PT General Charges $$ ACUTE PT VISIT: 1 Visit         1:50 PM, 07/18/23 Ocie Bob,  MPT Physical Therapist with [facility] 336 [phone] office [phone] mobile phone

## 2023-07-18 NOTE — [place] FL2 (Signed)
[place] MEDICAID FL2 LEVEL OF CARE FORM     IDENTIFICATION  Patient Name: Virginia [name] Birthdate: [birth year] Sex: female Admission Date (Current Location): 07/15/2023  [facility] and [place] Number:  [employer] and Address:  [facility],  [address] [place] [address]      Provider Number: [id number]  Attending Physician Name and Address:  Vassie Loll, MD  Relative Name and Phone Number:  Viviana Simpler (Sister)  [phone]    Current Level of Care: Hospital Recommended Level of Care: Skilled Nursing Facility Prior Approval Number:    Date Approved/Denied:   PASRR Number: [id number]  Discharge Plan: SNF    Current Diagnoses: Patient Active Problem List   Diagnosis Date Noted   Left displaced femoral neck fracture (HCC) 07/16/2023   Protein malnutrition (HCC) 01/18/2023   Acute on chronic congestive heart failure (HCC) 01/18/2023   Essential tremor 06/05/2022   Thrombophlebitis 08/21/2021   Symptomatic anemia 08/18/2021   H/O: GI bleed 08/18/2021   Hypokalemia 08/18/2021   Osteoarthritis 11/18/2019   Chronic atrial fibrillation (HCC) 12/10/2017   Moderate episode of recurrent major depressive disorder (HCC) 04/26/2016   Left renal artery stenosis (HCC) 11/10/2014   GERD (gastroesophageal reflux disease) 01/07/2014   CAD (coronary artery disease) 04/23/2013   Primary hypertension 04/23/2013   Hyperlipidemia 04/23/2013   Type 2 diabetes mellitus (HCC) 04/23/2013    Orientation RESPIRATION BLADDER Height & Weight     Self, Time, Situation, Place  Normal Indwelling catheter Weight: 58.6 kg Height:  5\' 4"  (162.6 cm)  BEHAVIORAL SYMPTOMS/MOOD NEUROLOGICAL BOWEL NUTRITION STATUS      Continent Diet (See DC summary)  AMBULATORY STATUS COMMUNICATION OF NEEDS Skin   Extensive Assist Verbally Skin abrasions, Bruising, Surgical wounds                       Personal Care Assistance Level of Assistance  Bathing, Feeding,  Dressing Bathing Assistance: Maximum assistance Feeding assistance: Limited assistance Dressing Assistance: Maximum assistance     Functional Limitations Info  Sight, Hearing, Speech Sight Info: Impaired Hearing Info: Impaired Speech Info: Adequate    SPECIAL CARE FACTORS FREQUENCY  PT (By licensed PT)     PT Frequency: 5 times a week              Contractures Contractures Info: Not present    Additional Factors Info  Code Status, Allergies Code Status Info: FULL Allergies Info: NKDA           Current Medications (07/18/2023):  This is the current hospital active medication list Current Facility-Administered Medications  Medication Dose Route Frequency Provider Last Rate Last Admin   0.9 %  sodium chloride infusion   Intravenous Continuous Vickki Hearing, MD 40 mL/hr at 07/18/23 1338 New Bag at 07/18/23 1338   acetaminophen (TYLENOL) tablet 650 mg  650 mg Oral Q6H PRN Vickki Hearing, MD   650 mg at 07/16/23 1551   Or   acetaminophen (TYLENOL) suppository 650 mg  650 mg Rectal Q6H PRN Vickki Hearing, MD       atorvastatin (LIPITOR) tablet 80 mg  80 mg Oral Daily Vickki Hearing, MD   80 mg at 07/18/23 1302   bisacodyl (DULCOLAX) suppository 10 mg  10 mg Rectal Daily PRN Vickki Hearing, MD       ceFAZolin (ANCEF) IVPB 2g/100 mL premix  2 g Intravenous Q6H Vickki Hearing, MD 200 mL/hr at 07/18/23 1337 2 g at  07/18/23 1337   docusate sodium (COLACE) capsule 100 mg  100 mg Oral BID Vickki Hearing, MD   100 mg at 07/18/23 1302   enoxaparin (LOVENOX) injection 30 mg  30 mg Subcutaneous Q24H Vickki Hearing, MD   30 mg at 07/18/23 1301   ferrous sulfate tablet 650 mg  650 mg Oral QHS Vickki Hearing, MD   650 mg at 07/17/23 2147   furosemide (LASIX) tablet 20 mg  20 mg Oral Daily Vickki Hearing, MD   20 mg at 07/18/23 1302   HYDROcodone-acetaminophen (NORCO) 7.5-325 MG per tablet 1-2 tablet  1-2 tablet Oral Q4H PRN Vickki Hearing, MD       HYDROcodone-acetaminophen (NORCO/VICODIN) 5-325 MG per tablet 1-2 tablet  1-2 tablet Oral Q4H PRN Vickki Hearing, MD       HYDROmorphone (DILAUDID) injection 0.5 mg  0.5 mg Intravenous Q3H PRN Vickki Hearing, MD   0.5 mg at 07/18/23 1316   labetalol (NORMODYNE) injection 10 mg  10 mg Intravenous Q4H PRN Vickki Hearing, MD       menthol-cetylpyridinium (CEPACOL) lozenge 3 mg  1 lozenge Oral PRN Vickki Hearing, MD       Or   phenol (CHLORASEPTIC) mouth spray 1 spray  1 spray Mouth/Throat PRN Vickki Hearing, MD       methocarbamol (ROBAXIN) tablet 500 mg  500 mg Oral TID Vickki Hearing, MD   500 mg at 07/18/23 1302   metoCLOPramide (REGLAN) tablet 5-10 mg  5-10 mg Oral Q8H PRN Vickki Hearing, MD       Or   metoCLOPramide (REGLAN) injection 5-10 mg  5-10 mg Intravenous Q8H PRN Vickki Hearing, MD       metoprolol succinate (TOPROL-XL) 24 hr tablet 50 mg  50 mg Oral Daily Vickki Hearing, MD   50 mg at 07/18/23 1302   mirtazapine (REMERON) tablet 7.5 mg  7.5 mg Oral QHS Vickki Hearing, MD   7.5 mg at 07/17/23 2149   morphine (PF) 2 MG/ML injection 0.5-1 mg  0.5-1 mg Intravenous Q2H PRN Vickki Hearing, MD       ondansetron [facility]) tablet 4 mg  4 mg Oral Q6H PRN Vickki Hearing, MD       Or   ondansetron [facility]) injection 4 mg  4 mg Intravenous Q6H PRN Vickki Hearing, MD       pantoprazole (PROTONIX) EC tablet 40 mg  40 mg Oral Daily Vickki Hearing, MD   40 mg at 07/18/23 1302   PARoxetine (PAXIL) tablet 10 mg  10 mg Oral q morning Vickki Hearing, MD   10 mg at 07/18/23 1301   polyethylene glycol (MIRALAX / GLYCOLAX) packet 17 g  17 g Oral Daily PRN Vickki Hearing, MD       sodium chloride flush (NS) 0.9 % injection 3 mL  3 mL Intravenous Q12H Vickki Hearing, MD   3 mL at 07/17/23 2153   sodium chloride flush (NS) 0.9 % injection 3 mL  3 mL Intravenous Q12H Vickki Hearing, MD   3 mL at 07/18/23 1337    sodium chloride flush (NS) 0.9 % injection 3 mL  3 mL Intravenous PRN Vickki Hearing, MD   3 mL at 07/16/23 2034   traMADol (ULTRAM) tablet 50 mg  50 mg Oral Q6H Vickki Hearing, MD   50 mg at 07/18/23 0843   tranexamic acid (CYKLOKAPRON)  IVPB 1,000 mg  1,000 mg Intravenous Once Vickki Hearing, MD       traZODone (DESYREL) tablet 50 mg  50 mg Oral QHS PRN Vickki Hearing, MD         Discharge Medications: Please see discharge summary for a list of discharge medications.  Relevant Imaging Results:  Relevant Lab Results:   Additional Information SS# [id number]  Leitha Bleak, RN

## 2023-07-18 NOTE — TOC Initial Note (Signed)
Transition of Care [facility]) - Initial/Assessment Note    Patient Details  Name: Virginia [name] MRN: [id number] Date of Birth: May 30, 1941  [facility]) CM/SW Contact:    Leitha Bleak, RN Phone Number: 07/18/2023, 1:50 PM  Clinical Narrative:       Patient admitted with left displaced femoral neck fracture. Post Surgery PT eval is recommending SNF.  CM at the bedside. Patient deferred conversation to her son. Her daughter live with her, she has Downs syndrome. Daughter is at the bedside. Son is agreeable that she needs SNF, due to hip surgery and have knee pain that has been preventing her from walking. [place] prefer [place] be close to family.  FL2 sent out for bed offers. Son asked about medicaid, CM sent financial advisor a message to see if she could be screened for medicaid. TOC following. DC unknown.         Expected Discharge Plan: Skilled Nursing Facility Barriers to Discharge: Continued Medical Work up, No SNF bed, Insurance Authorization   Patient Goals and CMS Choice Patient states their goals for this hospitalization and ongoing recovery are:: agreeable to SNF CMS Medicare.gov Compare Post Acute Care list provided to:: Patient Choice offered to / list presented to : Patient [place] ownership interest in [facility].provided to:: Patient    Expected Discharge Plan and Services     Post Acute Care Choice: Skilled Nursing Facility Living arrangements for the past 2 months: Single Family Home                      Prior Living Arrangements/Services Living arrangements for the past 2 months: Single Family Home Lives with:: Adult Children Patient language and need for interpreter reviewed:: Yes        Need for Family Participation in Patient Care: Yes (Comment) Care giver support system in place?: Yes (comment) Current home services: DME Criminal Activity/Legal Involvement Pertinent to Current Situation/Hospitalization: No - Comment as  needed  Activities of Daily Living   ADL Screening (condition at time of admission) Independently performs ADLs?: Yes (appropriate for developmental age) Is the patient deaf or have difficulty hearing?: No Does the patient have difficulty seeing, even when wearing glasses/contacts?: Yes Does the patient have difficulty concentrating, remembering, or making decisions?: Yes  Permission Sought/Granted                  Emotional Assessment     Affect (typically observed): Accepting Orientation: : Oriented to Self, Oriented to Place, Oriented to Situation Alcohol / Substance Use: Not Applicable Psych Involvement: No (comment)  Admission diagnosis:  Weakness [R53.1] Primary osteoarthritis of left knee [M17.12] Frequent falls [R29.6] Left displaced femoral neck fracture (HCC) [S72.002A] Patient Active Problem List   Diagnosis Date Noted   Left displaced femoral neck fracture (HCC) 07/16/2023   Protein malnutrition (HCC) 01/18/2023   Acute on chronic congestive heart failure (HCC) 01/18/2023   Essential tremor 06/05/2022   Thrombophlebitis 08/21/2021   Symptomatic anemia 08/18/2021   H/O: GI bleed 08/18/2021   Hypokalemia 08/18/2021   Osteoarthritis 11/18/2019   Chronic atrial fibrillation (HCC) 12/10/2017   Moderate episode of recurrent major depressive disorder (HCC) 04/26/2016   Left renal artery stenosis (HCC) 11/10/2014   GERD (gastroesophageal reflux disease) 01/07/2014   CAD (coronary artery disease) 04/23/2013   Primary hypertension 04/23/2013   Hyperlipidemia 04/23/2013   Type 2 diabetes mellitus (HCC) 04/23/2013   PCP:  Junie Spencer, FNP Pharmacy:   [facility], [place] - [address] [place] [address] Phone: [phone] Fax: [phone]  [facility]) - [place], [place] - [address] [facility] [address] [place] [address] [place] [address] Phone: [phone] Fax:  [phone]  [facility] - [place], [place] - [address] [place]  [address] Phone: [phone] Fax: [phone]     Social Drivers of Health (SDOH) Social History: SDOH Screenings   Food Insecurity: No Food Insecurity (07/16/2023)  Housing: Low Risk  (07/16/2023)  Transportation Needs: No Transportation Needs (07/16/2023)  Utilities: Not At Risk (07/16/2023)  Alcohol Screen: Low Risk  (01/23/2023)  Depression (PHQ2-9): Medium Risk (06/03/2023)  Financial Resource Strain: Low Risk  (01/23/2023)  Physical Activity: Insufficiently Active (01/23/2023)  Social Connections: Moderately Isolated (07/16/2023)  Stress: No Stress Concern Present (01/23/2023)  Tobacco Use: Low Risk  (07/17/2023)  Health Literacy: Adequate Health Literacy (01/23/2023)   SDOH Interventions:     Readmission Risk Interventions    07/18/2023    1:48 PM  Readmission Risk Prevention Plan  Transportation Screening Complete  PCP or Specialist Appt within 5-7 Days Not Complete  Home Care Screening Complete  Medication Review (RN CM) Complete

## 2023-07-19 DIAGNOSIS — I482 Chronic atrial fibrillation, unspecified: Secondary | ICD-10-CM | POA: Diagnosis not present

## 2023-07-19 DIAGNOSIS — G250 Essential tremor: Secondary | ICD-10-CM | POA: Diagnosis not present

## 2023-07-19 DIAGNOSIS — Z951 Presence of aortocoronary bypass graft: Secondary | ICD-10-CM | POA: Insufficient documentation

## 2023-07-19 DIAGNOSIS — S72002A Fracture of unspecified part of neck of left femur, initial encounter for closed fracture: Secondary | ICD-10-CM | POA: Diagnosis not present

## 2023-07-19 DIAGNOSIS — E1169 Type 2 diabetes mellitus with other specified complication: Secondary | ICD-10-CM | POA: Diagnosis not present

## 2023-07-19 DIAGNOSIS — M1712 Unilateral primary osteoarthritis, left knee: Secondary | ICD-10-CM

## 2023-07-19 LAB — CBC
HCT: 41.8 % (ref 36.0–46.0)
Hemoglobin: 13.7 g/dL (ref 12.0–15.0)
MCH: 31.9 pg (ref 26.0–34.0)
MCHC: 32.8 g/dL (ref 30.0–36.0)
MCV: 97.4 fL (ref 80.0–100.0)
Platelets: 150 10*3/uL (ref 150–400)
RBC: 4.29 MIL/uL (ref 3.87–5.11)
RDW: 12.8 % (ref 11.5–15.5)
WBC: 10.4 10*3/uL (ref 4.0–10.5)
nRBC: 0 % (ref 0.0–0.2)

## 2023-07-19 MED ORDER — POLYETHYLENE GLYCOL 3350 17 G PO PACK: 17.0000 g | PACK | Freq: Every day | ORAL | 0 refills | Status: AC | PRN

## 2023-07-19 MED ORDER — ACETAMINOPHEN 500 MG PO TABS: 1000.0000 mg | ORAL_TABLET | Freq: Three times a day (TID) | ORAL | Status: AC | PRN

## 2023-07-19 MED ORDER — FERROUS SULFATE 325 (65 FE) MG PO TABS: 650.0000 mg | ORAL_TABLET | Freq: Every day | ORAL | Status: AC

## 2023-07-19 MED ORDER — TRAMADOL HCL 50 MG PO TABS: 50.0000 mg | ORAL_TABLET | Freq: Two times a day (BID) | ORAL | 0 refills | Status: AC | PRN

## 2023-07-19 NOTE — Plan of Care (Signed)
  Problem: Clinical Measurements: Goal: Ability to maintain clinical measurements within normal limits will improve Outcome: Progressing   Problem: Elimination: Goal: Will not experience complications related to urinary retention Outcome: Progressing   Problem: Safety: Goal: Ability to remain free from injury will improve Outcome: Progressing   Problem: Activity: Goal: Ability to ambulate and perform ADLs will improve Outcome: Progressing   Problem: Pain Management: Goal: Pain level will decrease Outcome: Progressing   Problem: Self-Concept: Goal: Ability to maintain and perform role responsibilities to the fullest extent possible will improve Outcome: Progressing

## 2023-07-19 NOTE — Care Management Important Message (Signed)
Important Message  Patient Details  Name: Virginia [name] MRN: [id number] Date of Birth: 20-Nov-1940   Important Message Given:  Yes - Medicare IM     Corey Harold 07/19/2023, 12:07 PM

## 2023-07-19 NOTE — Discharge Summary (Addendum)
Physician Discharge Summary   Patient: Virginia [name] MRN: [id number] DOB: [birth year]  Admit date:     07/15/2023  Discharge date: 07/19/23  Discharge Physician: Vassie Loll   PCP: Junie Spencer, FNP   Recommendations at discharge:  Repeat CBC to follow hemoglobin trend/stability in 5 days Repeat basic metabolic panel to follow electrolytes and renal function Reassess blood pressure adjust antihypertensive treatment as needed Weightbearing as tolerated next DVT prophylaxis using Eliquis. Reassess blood pressure and further adjust antihypertensive regimen as needed  Discharge Diagnoses: Principal Problem:   Left displaced femoral neck fracture (HCC) Active Problems:   Type 2 diabetes mellitus (HCC)   Chronic atrial fibrillation (HCC)   GERD (gastroesophageal reflux disease)   Essential tremor  Brief Hospital admission course: As per H&P written by Dr. Mariea Clonts on 07/16/2023 Virginia [name]  is a 83 y.o. female with past medical history relevant for chronic atrial fibrillation on apixaban, GERD with prior history of GI bleed, CAD--post 3 Vessel CABG in 1999, DM2, chronic anemia, depression/anxiety, she osteoarthritis of the knees and HTN presents to the ED with left knee and left hip pain after falling at home -Denies loss of consciousness -Patient reports multiple falls over the last 3 weeks or so--- she has had ambulatory dysfunction partly due to significant left hip pain--- she gets around with a wheelchair at times -Patient denies chest pain palpitations or dizziness prior to falling or after falling, -EKG sinus rhythm -Chest x-ray without acute findings -CT of the left hip without contrast shows----IMPRESSION: 1. Acute impacted fracture of the left femoral neck. 2. Remote healed fractures of the left superior and inferior pubic Rami.   Knee x-rays without fractures -BMP with a creatinine of 0.67 potassium is 4.0 bicarb is 28 -CBC with a white count of 7.7 hemoglobin 15.8  and platelets of 143 -UA suggestive of UTI--- urine culture pending  Assessment and Plan: 1-left hip fracture/acute impacted fracture of the left femoral neck -Patient underwent Bipolar partial left hip replacement by Orthopedic service on 07/17/23; patient tolerated procedure well. -Continue heart healthy diet; Eliquis for DVT prophylaxis. -Continue as needed analgesics -Physical therapy recommending a skilled nursing facility for further rehab and conditioning. -Continue incentive spirometry -Outpatient follow-up with orthopedic service in 4 weeks.   2-presumed UTI -Urinalysis suggestive of urinary tract infection -No fever, no leukocytosis and Patient reports no dysuria -Urine cultures remains negative without any isolated growth -No antibiotics provided -Patient advised to maintain adequate hydration.   3-chronic anemia -Continue iron supplementation -No overt bleeding currently appreciated -Patient with prior history of GI bleed. -Continue to follow hemoglobin trend   4-gastroesophageal reflux disease -Continue PPI.   5-coronary artery disease -Status post three-vessel CABG in 1999 -Patient denies any chest pain -Continue Lipitor, metoprolol for secondary prevention -No aspirin as patient is on Eliquis for A-fib stroke prophylaxis. -Eliquis restarted after surgery   6-history of atrial fibrillation -Continue Eliquis for secondary prevention. -Rate controlled and is stable.   7-essential hypertension -Continue current antihypertensive agent -Patient blood pressure slightly elevated most likely associated with ongoing pain. -Continue to monitor vital signs and further adjust antihypertensive regimen as needed.  8-type 2 diabetes mellitus -Stable and compensated -Currently following diet control -Continue outpatient follow-up with PCP    Consultants: Orthopedic service Procedures performed: See below for x-ray reports. Disposition: Skilled nursing facility. Diet  recommendation: Heart healthy diet.  DISCHARGE MEDICATION: Allergies as of 07/19/2023   No Known Allergies      Medication List  STOP taking these medications    nystatin cream Commonly known as: MYCOSTATIN   predniSONE 10 MG tablet Commonly known as: DELTASONE       TAKE these medications    acetaminophen 500 MG tablet Commonly known as: TYLENOL Take 2 tablets (1,000 mg total) by mouth every 8 (eight) hours as needed. What changed:  when to take this reasons to take this   apixaban 5 MG Tabs tablet Commonly known as: Eliquis Take 1 tablet (5 mg total) by mouth 2 (two) times daily.   ascorbic acid 500 MG tablet Commonly known as: VITAMIN C Take 500 mg by mouth daily.   atorvastatin 80 MG tablet Commonly known as: LIPITOR TAKE 1 TABLET BY MOUTH DAILY   calcium-vitamin D 500-5 MG-MCG tablet Commonly known as: OSCAL WITH D Take 1 tablet by mouth.   cetirizine 10 MG tablet Commonly known as: ZYRTEC TAKE ONE (1) TABLET BY MOUTH EVERY DAY   Farxiga 10 MG Tabs tablet Generic drug: dapagliflozin propanediol TAKE 1 TABLET BY MOUTH DAILY  BEFORE BREAKFAST   ferrous sulfate 325 (65 FE) MG tablet Take 2 tablets (650 mg total) by mouth at bedtime.   furosemide 20 MG tablet Commonly known as: LASIX Take 1 tablet (20 mg total) by mouth daily.   lactose free nutrition Liqd Take 237 mLs by mouth daily.   metoprolol succinate 50 MG 24 hr tablet Commonly known as: TOPROL-XL TAKE 1 AND 1/2 TABLETS BY MOUTH  DAILY TAKE WITH OR IMMEDIATELY  FOLLOWING A MEAL What changed: See the new instructions.   mirtazapine 7.5 MG tablet Commonly known as: REMERON TAKE 1 TABLET BY MOUTH AT  BEDTIME   pantoprazole 40 MG tablet Commonly known as: PROTONIX Take 1 tablet (40 mg total) by mouth daily.   PARoxetine 10 MG tablet Commonly known as: PAXIL TAKE 1 TABLET BY MOUTH EVERY  MORNING   polyethylene glycol 17 g packet Commonly known as: MIRALAX / GLYCOLAX Take 17 g by  mouth daily as needed for mild constipation.   traMADol 50 MG tablet Commonly known as: ULTRAM Take 1 tablet (50 mg total) by mouth every 12 (twelve) hours as needed for severe pain (pain score 7-10).        Contact information for follow-up providers     Junie Spencer, FNP. Schedule an appointment as soon as possible for a visit in 2 week(s).   Specialty: Family Medicine Why: After discharge from the skilled nursing facility. Contact information: [address] [place] [address] [phone]         Vickki Hearing, MD. Schedule an appointment as soon as possible for a visit in 4 week(s).   Specialties: Orthopedic Surgery, Radiology Contact information: [address] [place] [address] [phone]              Contact information for after-discharge care     Destination     [facility] Preferred SNF .   Service: Skilled Nursing Contact information: [address] [place] [address] [phone]                    Discharge Exam: Ceasar Mons Weights   07/15/23 1843 07/16/23 0948  Weight: 61.2 kg 58.6 kg   General exam: Alert, awake, following commands. Afebrile, no nausea, no vomiting. Currently not complaining of left hip pain.  Respiratory system: Clear to auscultation. Respiratory effort normal.  Saturation on room air. Cardiovascular system: regular rate and rhythm. No  murmurs, rubs, gallops. No JVD. Gastrointestinal system: Abdomen is nondistended, soft and nontender. No organomegaly or masses felt. Normal bowel sounds heard. Central nervous system: No focal neurological deficits. Extremities: No cyanosis or clubbing. +pedal pulses, surgical site without erythema or signs of infection Skin: No rashes, lesions or ulcers Psychiatry: Mood & affect appropriate.    Condition at discharge: stable  The results of significant diagnostics from this hospitalization (including imaging,  microbiology, ancillary and laboratory) are listed below for reference.   Imaging Studies: DG Pelvis Portable Result Date: 07/17/2023 CLINICAL DATA:  Status post probe placement. EXAM: PORTABLE PELVIS 1-2 VIEWS COMPARISON:  Preoperative imaging FINDINGS: Left hip hemiarthroplasty in expected alignment. No periprosthetic lucency or fracture. Recent postsurgical change includes air and edema in the soft tissues. Lateral skin staples in place. There is slight offset of the right femoral neck. IMPRESSION: 1. Left hip arthroplasty without immediate postoperative complication. 2. Slight offset of the right femoral neck, may be due to positioning. Recommend correlation for any right hip symptoms. Electronically Signed   By: Narda Rutherford M.D.   On: 07/17/2023 16:33   DG Chest Portable 1 View Result Date: 07/15/2023 CLINICAL DATA:  Weakness EXAM: PORTABLE CHEST 1 VIEW COMPARISON:  11/15/2022 FINDINGS: Chronic cardiomegaly post CABG. Unchanged mediastinal contours. No focal airspace disease, large pleural effusion or pneumothorax. Prior median sternotomy. Chronic changes about the right shoulder. IMPRESSION: Chronic cardiomegaly post CABG. No acute findings. Electronically Signed   By: Narda Rutherford M.D.   On: 07/15/2023 22:43   CT Hip Left Wo Contrast Result Date: 07/15/2023 CLINICAL DATA:  Hip trauma, fracture suspected, xray done EXAM: CT OF THE LEFT HIP WITHOUT CONTRAST TECHNIQUE: Multidetector CT imaging of the left hip was performed according to the standard protocol. Multiplanar CT image reconstructions were also generated. RADIATION DOSE REDUCTION: This exam was performed according to the departmental dose-optimization program which includes automated exposure control, adjustment of the mA and/or kV according to patient size and/or use of iterative reconstruction technique. COMPARISON:  Femur radiograph earlier today FINDINGS: Bones/Joint/Cartilage Impacted fracture of the femoral neck. Minimal apex  anterior angulation. No hip dislocation. There are remote healed fractures of the left superior and inferior pubic rami. There is degenerative change of the pubic symphysis and left sacroiliac joint. Ligaments Suboptimally assessed by CT. Muscles and Tendons No intramuscular hematoma. Soft tissues Tissue edema with probable small hematoma posteriorly. Probable calcified uterine fibroids. Colonic diverticulosis. IMPRESSION: 1. Acute impacted fracture of the left femoral neck. 2. Remote healed fractures of the left superior and inferior pubic rami. Electronically Signed   By: Narda Rutherford M.D.   On: 07/15/2023 22:42   DG Knee 2 Views Left Result Date: 07/15/2023 CLINICAL DATA:  Pain after fall, injury. EXAM: LEFT KNEE - 1-2 VIEW; LEFT FEMUR 2 VIEWS COMPARISON:  Knee radiograph 11/30/2021 FINDINGS: Knee: No acute fracture or dislocation. Moderate to advanced tricompartmental osteoarthritis. Suspected ossified intra-articular body posteriorly. Chronic calcifications anteriorly in the region of the quadriceps tendon. No large joint effusion. There is mild anterior soft tissue edema. Femur: No acute fracture. Hip alignment is maintained. No focal bone abnormalities. Vascular calcifications are seen. IMPRESSION: 1. No acute fracture or dislocation of the left knee or femur. 2. Moderate to advanced tricompartmental osteoarthritis of the knee. Electronically Signed   By: Narda Rutherford M.D.   On: 07/15/2023 19:56   DG FEMUR MIN 2 VIEWS LEFT Result Date: 07/15/2023 CLINICAL DATA:  Pain after fall, injury. EXAM: LEFT KNEE - 1-2 VIEW; LEFT FEMUR 2  VIEWS COMPARISON:  Knee radiograph 11/30/2021 FINDINGS: Knee: No acute fracture or dislocation. Moderate to advanced tricompartmental osteoarthritis. Suspected ossified intra-articular body posteriorly. Chronic calcifications anteriorly in the region of the quadriceps tendon. No large joint effusion. There is mild anterior soft tissue edema. Femur: No acute fracture. Hip  alignment is maintained. No focal bone abnormalities. Vascular calcifications are seen. IMPRESSION: 1. No acute fracture or dislocation of the left knee or femur. 2. Moderate to advanced tricompartmental osteoarthritis of the knee. Electronically Signed   By: Narda Rutherford M.D.   On: 07/15/2023 19:56   Labs: CBC: Recent Labs  Lab 07/15/23 2224 07/17/23 0349 07/18/23 0809 07/19/23 0516  WBC 7.7 8.5 11.6* 10.4  NEUTROABS 5.6  --   --   --   HGB 15.8* 14.9 14.6 13.7  HCT 46.8* 44.9 43.3 41.8  MCV 97.3 96.8 97.7 97.4  PLT 143* 146* 150 150   Basic Metabolic Panel: Recent Labs  Lab 07/15/23 2224 07/17/23 0349 07/18/23 0809  NA 139 137 137  K 4.0 3.5 4.4  CL 100 101 102  CO2 28 27 24   GLUCOSE 168* 139* 196*  BUN 16 24* 21  CREATININE 0.67 0.61 0.66  CALCIUM 10.0 9.5 9.5   CBG: Recent Labs  Lab 07/16/23 1126 07/16/23 1620 07/16/23 2118 07/17/23 1107 07/17/23 1528  GLUCAP 146* 160* 193* 154* 203*    Discharge time spent: greater than 30 minutes.  Signed: Vassie Loll, MD [facility] 07/19/2023

## 2023-07-19 NOTE — TOC Transition Note (Signed)
Transition of Care [facility]) - Discharge Note   Patient Details  Name: Virginia [name] MRN: [id number] Date of Birth: 1941/05/06  Transition of Care [facility]) CM/SW Contact:  Isabella Bowens, LCSWA Phone Number: 07/19/2023, 1:02 PM   Clinical Narrative:    Patient is discharging today to [facility]. Son Annette Stable was notified. Destiny in admission contacted regarding DC readiness. Patient room number is 155-1 and call for report is [phone] , nurse is Warden/ranger at [facility]. Nurse notified. Med necessity form was completed. EMS called. TOC signing off.    Final next level of care: Skilled Nursing Facility Barriers to Discharge: Barriers Resolved   Patient Goals and CMS Choice Patient states their goals for this hospitalization and ongoing recovery are:: DC to SNF CMS Medicare.gov Compare Post Acute Care list provided to:: Patient Choice offered to / list presented to : Patient [place] ownership interest in [facility].provided to:: Patient    Discharge Placement                Patient to be transferred to facility by: Ambulance Name of family member notified: Son -Bill Patient and family notified of of transfer: 07/19/23  Discharge Plan and Services Additional resources added to the After Visit Summary for       Post Acute Care Choice: Skilled Nursing Facility                               Social Drivers of Health (SDOH) Interventions SDOH Screenings   Food Insecurity: No Food Insecurity (07/16/2023)  Housing: Low Risk  (07/16/2023)  Transportation Needs: No Transportation Needs (07/16/2023)  Utilities: Not At Risk (07/16/2023)  Alcohol Screen: Low Risk  (01/23/2023)  Depression (PHQ2-9): Medium Risk (06/03/2023)  Financial Resource Strain: Low Risk  (01/23/2023)  Physical Activity: Insufficiently Active (01/23/2023)  Social Connections: Moderately Isolated (07/16/2023)  Stress: No Stress Concern Present (01/23/2023)  Tobacco Use: Low Risk  (07/17/2023)   Health Literacy: Adequate Health Literacy (01/23/2023)     Readmission Risk Interventions    07/19/2023   12:56 PM 07/18/2023    1:48 PM  Readmission Risk Prevention Plan  Transportation Screening Complete Complete  PCP or Specialist Appt within 5-7 Days  Not Complete  Home Care Screening Complete Complete  Medication Review (RN CM) Complete Complete

## 2023-07-20 DIAGNOSIS — S72002A Fracture of unspecified part of neck of left femur, initial encounter for closed fracture: Secondary | ICD-10-CM | POA: Diagnosis not present

## 2023-07-20 LAB — CBC
HCT: 43.6 % (ref 36.0–46.0)
Hemoglobin: 14.5 g/dL (ref 12.0–15.0)
MCH: 33 pg (ref 26.0–34.0)
MCHC: 33.3 g/dL (ref 30.0–36.0)
MCV: 99.1 fL (ref 80.0–100.0)
Platelets: 163 10*3/uL (ref 150–400)
RBC: 4.4 MIL/uL (ref 3.87–5.11)
RDW: 12.6 % (ref 11.5–15.5)
WBC: 8.8 10*3/uL (ref 4.0–10.5)
nRBC: 0 % (ref 0.0–0.2)

## 2023-07-20 MED ORDER — SODIUM CHLORIDE 0.9 % IV SOLN: INTRAVENOUS | Status: AC

## 2023-07-20 MED ORDER — MEGESTROL ACETATE 400 MG/10ML PO SUSP
400.0000 mg | Freq: Two times a day (BID) | ORAL | Status: DC
Start: 2023-07-20 — End: 2023-07-22
  Administered 2023-07-20 – 2023-07-22 (×5): 400 mg via ORAL
  Filled 2023-07-20 (×5): qty 10

## 2023-07-20 MED ORDER — ADULT MULTIVITAMIN W/MINERALS CH
1.0000 | ORAL_TABLET | Freq: Every day | ORAL | Status: DC
  Administered 2023-07-20 – 2023-07-22 (×3): 1 via ORAL
  Filled 2023-07-20 (×3): qty 1

## 2023-07-20 MED ORDER — SODIUM CHLORIDE 0.9 % IV SOLN
1.0000 g | Freq: Once | INTRAVENOUS | Status: AC
  Administered 2023-07-20: 1 g via INTRAVENOUS
  Filled 2023-07-20: qty 10

## 2023-07-20 MED ORDER — MEGESTROL ACETATE 400 MG/10ML PO SUSP: 400.0000 mg | Freq: Two times a day (BID) | ORAL | 0 refills | Status: AC

## 2023-07-20 MED ORDER — CEPHALEXIN 500 MG PO CAPS
500.0000 mg | ORAL_CAPSULE | Freq: Three times a day (TID) | ORAL | Status: DC
  Administered 2023-07-21 – 2023-07-22 (×4): 500 mg via ORAL
  Filled 2023-07-20 (×4): qty 1

## 2023-07-20 NOTE — Progress Notes (Signed)
Patient ID: Virginia [name], female   DOB: 12-30-1940, 83 y.o.   MRN: [id number]  Patient is more confused than previous assessment. Intake poor. Urine output diminished and dark. Md notified. Orders obtained for IV fluids and ABT.    Lidia Collum, RN

## 2023-07-20 NOTE — TOC Progression Note (Signed)
Transition of Care [facility]) - Progression Note    Patient Details  Name: Virginia [name] MRN: [id number] Date of Birth: May 28, 1941  Transition of Care [facility]) CM/SW Contact  Otelia Santee, LCSW Phone Number: 07/20/2023, 10:27 AM  Clinical Narrative:    Sherron Monday with [facility] who share that they are unable to accept pt to their facility until Monday due to not being able to obtain medications for pt over the weekend.    Expected Discharge Plan: Skilled Nursing Facility Barriers to Discharge: Barriers Resolved  Expected Discharge Plan and Services     Post Acute Care Choice: Skilled Nursing Facility Living arrangements for the past 2 months: Single Family Home Expected Discharge Date: 07/19/23                                     Social Determinants of Health (SDOH) Interventions SDOH Screenings   Food Insecurity: No Food Insecurity (07/16/2023)  Housing: Low Risk  (07/16/2023)  Transportation Needs: No Transportation Needs (07/16/2023)  Utilities: Not At Risk (07/16/2023)  Alcohol Screen: Low Risk  (01/23/2023)  Depression (PHQ2-9): Medium Risk (06/03/2023)  Financial Resource Strain: Low Risk  (01/23/2023)  Physical Activity: Insufficiently Active (01/23/2023)  Social Connections: Moderately Isolated (07/16/2023)  Stress: No Stress Concern Present (01/23/2023)  Tobacco Use: Low Risk  (07/17/2023)  Health Literacy: Adequate Health Literacy (01/23/2023)    Readmission Risk Interventions    07/19/2023   12:56 PM 07/18/2023    1:48 PM  Readmission Risk Prevention Plan  Transportation Screening Complete Complete  PCP or Specialist Appt within 5-7 Days  Not Complete  Home Care Screening Complete Complete  Medication Review (RN CM) Complete Complete

## 2023-07-20 NOTE — Progress Notes (Signed)
  Progress Note   Patient: Virginia [name] ZOX:[id number] DOB: 1941/02/27 DOA: 07/15/2023     4 DOS: the patient was seen and examined on 07/20/2023   Brief hospital admission course: 83 y.o. female with past medical history relevant for chronic atrial fibrillation on apixaban, GERD with prior history of GI bleed, CAD--s/p 3 Vessel CABG in 1999, DM2, chronic anemia, depression/anxiety, she osteoarthritis of the knees and HTN admitted on 07/10/2023 with left hip fracture after mechanical fall -Status post ORIF on 07/17/2023 -Patient remains medically stable for discharge, she is now awaiting SNF bed and insurance authorization to go to SNF rehab  Assessment and Plan: 1)-Left hip fracture/acute impacted fracture of the left femoral neck -Patient underwent Bipolar partial left hip replacement by Orthopedic service on 07/17/23 by Dr. Romeo Apple -Postoperative orthopedic instructions as below:- Weightbearing as tolerated Direct lateral hip precautions DVT prophylaxis for 42 days--- okay to use PTA Eliquis Remove staples at 12 to 14 days Postop appointment scheduled for 28 days -Continue incentive spirometry  2-E coli UTI -- Urine culture from 07/15/2023 with pansensitive E. Coli -Give 1 dose of IV Rocephin on 07/20/2023, followed by p.o. Keflex as ordered  3-chronic anemia -Continue iron supplementation -Hgb currently stable around 14 -No overt bleeding currently appreciated -Patient with prior history of GI bleed.  4-gastroesophageal reflux disease -Continue Protonix  5-coronary artery disease -Status post three-vessel CABG in 1999 -Patient denies any chest pain -Continue Lipitor, metoprolol for secondary prevention -No aspirin as patient is on Eliquis for A-fib stroke prophylaxis. -Eliquis restarted after surgery  6-history of atrial fibrillation -Eliquis restarted after surgery -Continue metoprolol for rate control  7-essential hypertension -Continue metoprolol  8)Anorexia/excessive  daytime sleepiness--- stop Remeron due to excessive daytime sleepiness -Okay to use Megace for appetite stimulation instead   Subjective:  -Daughter Crystal and Son [place]  at bedside -Questions answered -Somewhat sleepy  Physical Exam: Vitals:   07/19/23 1506 07/19/23 2020 07/20/23 0128 07/20/23 0517  BP: (!) 109/57 123/72 (!) 148/78 (!) 156/82  Pulse: 68 91 64 61  Resp: 14 18 16 18   Temp: 98.3 F (36.8 C) 98 F (36.7 C) (!) 97.5 F (36.4 C) 97.9 F (36.6 C)  TempSrc:   Oral   SpO2: 99% 98% 100% 93%  Weight:      Height:        Physical Exam  Gen:-Sleepy but arousable, in no acute distress  HEENT:- [place].AT, No sclera icterus Neck-Supple Neck,No JVD,.  Lungs-  CTAB , fair air movement bilaterally  CV- S1, S2 normal, RRR Abd-  +ve B.Sounds, Abd Soft, No tenderness,    Extremity/Skin:- No  edema,   good pedal pulses  Psych-affect is flat, oriented x2, forgetful, cognitive and memory deficits noted Neuro-generalized weakness, no new focal deficits, no tremors MSK- Lt hip post op wound is clean dry and intact   Family Communication: Daughter Engineer, manufacturing and Son Tommy  at bedside  Disposition: SNF---Patient remains medically stable for discharge, she is now awaiting SNF bed and insurance authorization to go to SNF rehab Status is: Inpatient  Author: Shon Hale, MD 07/20/2023 9:34 AM  For on call review [email or web address].

## 2023-07-21 DIAGNOSIS — S72002A Fracture of unspecified part of neck of left femur, initial encounter for closed fracture: Secondary | ICD-10-CM | POA: Diagnosis not present

## 2023-07-21 DIAGNOSIS — I482 Chronic atrial fibrillation, unspecified: Secondary | ICD-10-CM | POA: Diagnosis not present

## 2023-07-21 DIAGNOSIS — G250 Essential tremor: Secondary | ICD-10-CM | POA: Diagnosis not present

## 2023-07-21 DIAGNOSIS — K219 Gastro-esophageal reflux disease without esophagitis: Secondary | ICD-10-CM | POA: Diagnosis not present

## 2023-07-21 LAB — CBC
HCT: 52 % — ABNORMAL HIGH (ref 36.0–46.0)
Hemoglobin: 15.9 g/dL — ABNORMAL HIGH (ref 12.0–15.0)
MCH: 31.7 pg (ref 26.0–34.0)
MCHC: 30.6 g/dL (ref 30.0–36.0)
MCV: 103.8 fL — ABNORMAL HIGH (ref 80.0–100.0)
Platelets: 139 10*3/uL — ABNORMAL LOW (ref 150–400)
RBC: 5.01 MIL/uL (ref 3.87–5.11)
RDW: 12.3 % (ref 11.5–15.5)
WBC: 10.4 10*3/uL (ref 4.0–10.5)
nRBC: 0 % (ref 0.0–0.2)

## 2023-07-21 NOTE — Evaluation (Signed)
Clinical/Bedside Swallow Evaluation Patient Details  Name: Virginia [name] MRN: [id number] Date of Birth: 07-Jul-1940  Today's Date: 07/21/2023 Time: SLP Start Time (ACUTE ONLY): 1447 SLP Stop Time (ACUTE ONLY): 1517 SLP Time Calculation (min) (ACUTE ONLY): 30 min  Past Medical History:  Past Medical History:  Diagnosis Date   Coronary artery disease    status post coronary artery bypass grafting x3 in 1999   GERD (gastroesophageal reflux disease)    Hyperlipidemia    Hypertension    Renal artery stenosis (HCC)    S/P CABG (coronary artery bypass graft) 06/18/1997   x3   Type 2 diabetes mellitus (HCC)    Past Surgical History:  Past Surgical History:  Procedure Laterality Date   BIOPSY  08/19/2021   Procedure: BIOPSY;  Surgeon: Dolores Frame, MD;  Location: [facility];  Service: Gastroenterology;;   CARDIAC CATHETERIZATION  04/17/2007   L main 50-60% ostital stenosis, patent LAD, 90% stenosis at L Cfx, dominant RCA with 60-70% segmental stenosis, patent LIMA-LAD, patent free LIMA-OM, patent VG-PDA (Dr. Erlene Quan)   CARDIAC CATHETERIZATION  03/02/2003   L main with 50% osital stenosis, LAD totally occluded after 2nd diagonal, L Cfx with 90% ostial OM, RCA with 70-80% segmental stenosis, patent LIMA-LAD, patent free RIMA-Cfx marginal, patent VG to PDA (Dr. Erlene Quan)   CARDIAC CATHETERIZATION  05/18/1998   significant ostial Cfx & OM & mid LAD disease - subsequent CABG (Dr. Erlene Quan)   CAROTID DOPPLER  03/2006   right bulb with 0-49% diameter reduction   CORONARY ARTERY BYPASS GRAFT  05/19/1998   LIMA to LAD, free RIMA to OM, VG to distal RCA   ESOPHAGOGASTRODUODENOSCOPY (EGD) WITH PROPOFOL N/A 08/19/2021   Procedure: ESOPHAGOGASTRODUODENOSCOPY (EGD) WITH PROPOFOL;  Surgeon: Dolores Frame, MD;  Location: [facility];  Service: Gastroenterology;  Laterality: N/A;   ESOPHAGOGASTRODUODENOSCOPY (EGD) WITH PROPOFOL N/A 10/17/2021   Procedure: ESOPHAGOGASTRODUODENOSCOPY  (EGD) WITH PROPOFOL;  Surgeon: Dolores Frame, MD;  Location: [facility];  Service: Gastroenterology;  Laterality: N/A;  730   HIP ARTHROPLASTY Left 07/17/2023   Procedure: ARTHROPLASTY BIPOLAR HIP (HEMIARTHROPLASTY);  Surgeon: Vickki Hearing, MD;  Location: [facility];  Service: Orthopedics;  Laterality: Left;   NM MYOCAR PERF WALL MOTION  04/2011   lexiscan - fixed paical and basal-mid lateral defect, no reversible ischemia; EF 58%; PVCs noted; low risk   RENAL DOPPLER  09/2012   right renal artery 60-99% diameter reduction, left renal artery 1-59% diameter reduction   TOTAL KNEE ARTHROPLASTY Right    Dr. Hilda Lias   HPI:  83 y.o. female with past medical history relevant for chronic atrial fibrillation on apixaban, GERD with prior history of GI bleed, CAD--s/p 3 Vessel CABG in 1999, DM2, chronic anemia, depression/anxiety, she osteoarthritis of the knees and HTN admitted on 07/10/2023 with left hip fracture after mechanical fall    Assessment / Plan / Recommendation  Clinical Impression  Clinical swallowing evaluation completed while Pt was sitting upright in bed. Nurse reports pocketing and difficulty masticating regular diet. Pt consumed thin liquids without overt s/sx of aspiration. With regular trials - one very small bite of cracker note significantly prolonged mastication and oral residue after the swallow. With puree and ice cream note prolonged mastication but much improved AP transit timeliness with these textures. Recommed continue with D2/fine chop diet and thin liquids. Sister, at bedside reports Pt "hates to eat" and eats very little. Pt consumes drinks most of her calories. ST will follow  acutely to ensure diet tolerance. Thank you for this referral, SLP Visit Diagnosis: Dysphagia, unspecified (R13.10)    Aspiration Risk  Mild aspiration risk;Moderate aspiration risk    Diet Recommendation Dysphagia 2 (Fine chop);Thin liquid    Liquid Administration via:  Cup;Straw Medication Administration: Crushed with puree Supervision: Patient able to self feed;Staff to assist with self feeding Compensations: Minimize environmental distractions;Slow rate;Small sips/bites Postural Changes: Seated upright at 90 degrees    Other  Recommendations Oral Care Recommendations: Oral care BID;Oral care prior to ice chip/H20    Recommendations for follow up therapy are one component of a multi-disciplinary discharge planning process, led by the attending physician.  Recommendations may be updated based on patient status, additional functional criteria and insurance authorization.        Functional Status Assessment Patient has had a recent decline in their functional status and demonstrates the ability to make significant improvements in function in a reasonable and predictable amount of time.  Frequency and Duration min 1 x/week  1 week       Prognosis Prognosis for improved oropharyngeal function: Fair      Swallow Study   General Date of Onset: 07/15/23 HPI: 83 y.o. female with past medical history relevant for chronic atrial fibrillation on apixaban, GERD with prior history of GI bleed, CAD--s/p 3 Vessel CABG in 1999, DM2, chronic anemia, depression/anxiety, she osteoarthritis of the knees and HTN admitted on 07/10/2023 with left hip fracture after mechanical fall Type of Study: Bedside Swallow Evaluation Previous Swallow Assessment: none in chart Diet Prior to this Study: Dysphagia 2 (finely chopped);Thin liquids (Level 0) Temperature Spikes Noted: No Respiratory Status: Room air History of Recent Intubation: No Behavior/Cognition: Alert;Cooperative;Pleasant mood Oral Cavity Assessment: Within Functional Limits Oral Care Completed by SLP: Recent completion by staff Oral Cavity - Dentition: Missing dentition Vision: Functional for self-feeding Self-Feeding Abilities: Able to feed self;Needs assist;Needs set up Patient Positioning: Upright in  bed Baseline Vocal Quality: Normal Volitional Cough: Strong Volitional Swallow: Able to elicit    Oral/Motor/Sensory Function     Ice Chips Ice chips: Within functional limits   Thin Liquid Thin Liquid: Within functional limits    Nectar Thick Nectar Thick Liquid: Not tested   Honey Thick Honey Thick Liquid: Not tested   Puree Puree: Within functional limits   Solid     Solid: Impaired Presentation: Self Fed Oral Phase Impairments: Reduced lingual movement/coordination;Impaired mastication Oral Phase Functional Implications: Prolonged oral transit;Impaired mastication;Oral residue     Ellese Julius H. Romie Levee, CCC-SLP Speech Language Pathologist  Georgetta Haber 07/21/2023,3:17 PM

## 2023-07-21 NOTE — Progress Notes (Signed)
  Progress Note   Patient: Virginia [name]:[id number] DOB: August 23, 1940 DOA: 07/15/2023     5 DOS: the patient was seen and examined on 07/21/2023   Brief hospital admission course: 83 y.o. female with past medical history relevant for chronic atrial fibrillation on apixaban, GERD with prior history of GI bleed, CAD--s/p 3 Vessel CABG in 1999, DM2, chronic anemia, depression/anxiety, she osteoarthritis of the knees and HTN admitted on 07/10/2023 with left hip fracture after mechanical fall -Status post ORIF on 07/17/2023 -Patient remains medically stable for discharge, she is now awaiting SNF bed and insurance authorization to go to SNF rehab  Assessment and Plan: 1)-Left hip fracture/acute impacted fracture of the left femoral neck -Patient underwent Bipolar partial left hip replacement by Orthopedic service on 07/17/23 by Dr. Romeo Apple -Postoperative orthopedic instructions as below:- Weightbearing as tolerated Direct lateral hip precautions -Patient Eliquis has been resumed. Remove staples at 12 to 14 days Postop appointment scheduled for 28 days -Continue incentive spirometry and supportive care.  2-E coli UTI -- Urine culture from 07/15/2023 with pansensitive E. Coli -Give 1 dose of IV Rocephin on 07/20/2023, followed by p.o. Keflex as ordered -Patient denies dysuria.  3-chronic anemia -Continue iron supplementation -Hgb currently stable around 14 -No overt bleeding currently appreciated -Patient with prior history of GI bleed.  4-gastroesophageal reflux disease -Continue Protonix  5-coronary artery disease -Status post three-vessel CABG in 1999 -Patient denies any chest pain -Continue Lipitor, metoprolol for secondary prevention -No aspirin as patient is on Eliquis for A-fib stroke prophylaxis. -Eliquis restarted after surgery  6-history of atrial fibrillation -Eliquis restarted after surgery -Continue metoprolol for rate control  7-essential hypertension -Continue  metoprolol  8)Anorexia/excessive daytime sleepiness-- --Remeron has been discontinued. -Okay to use Megace for appetite stimulation if needed.  9) history of atrial fibrillation -Continue treatment with Eliquis for secondary prevention -Rate control and is stable.   Subjective:  In no acute distress; family reporting decreased oral intake.  Afebrile.  No nausea vomiting. Physical Exam: Vitals:   07/20/23 0517 07/20/23 1532 07/20/23 2145 07/21/23 1514  BP: (!) 156/82 (!) 147/81 (!) 154/77 (!) 122/93  Pulse: 61 90 95 82  Resp: 18 18 18 16   Temp: 97.9 F (36.6 C) 99.1 F (37.3 C) 99 F (37.2 C) 97.6 F (36.4 C)  TempSrc:  Oral Oral Axillary  SpO2: 93% 97% 99% 100%  Weight:      Height:        Physical Exam General exam: Alert, awake, following commands appropriately and in no acute distress.  Patient is afebrile and expressed no dysuria. Respiratory system: Good air movement appreciated bilaterally; no using accessory muscles. Cardiovascular system:RRR. No murmurs, rubs, gallops. Gastrointestinal system: Abdomen is nondistended, soft and nontender. No organomegaly or masses felt. Normal bowel sounds heard. Central nervous system: No focal neurological deficits. Extremities: No cyanosis or clubbing.  Left hip postoperative wound is clean, dry and intact. Skin: No petechiae. Psychiatry: Flat affect.   Family Communication: Son at bedside.  Disposition: SNF---Patient remains medically stable for discharge, she is now awaiting SNF bed and insurance authorization to go to SNF rehab Status is: Inpatient  Author: Vassie Loll, MD 07/21/2023 5:10 PM  For on call review [email or web address].

## 2023-07-21 NOTE — Anesthesia Postprocedure Evaluation (Signed)
Anesthesia Post Note  Patient: Virginia [name]  Procedure(s) Performed: ARTHROPLASTY BIPOLAR HIP (HEMIARTHROPLASTY) (Left: Hip)  Patient location during evaluation: Phase II Anesthesia Type: General Level of consciousness: awake Pain management: pain level controlled Vital Signs Assessment: post-procedure vital signs reviewed and stable Respiratory status: spontaneous breathing and respiratory function stable Cardiovascular status: blood pressure returned to baseline and stable Postop Assessment: no headache and no apparent nausea or vomiting Anesthetic complications: no Comments: Late entry   No notable events documented.   Last Vitals:  Vitals:   07/20/23 1532 07/20/23 2145  BP: (!) 147/81 (!) 154/77  Pulse: 90 95  Resp: 18 18  Temp: 37.3 C 37.2 C  SpO2: 97% 99%    Last Pain:  Vitals:   07/20/23 2234  TempSrc:   PainSc: Asleep                 Windell Norfolk

## 2023-07-22 DIAGNOSIS — S72002D Fracture of unspecified part of neck of left femur, subsequent encounter for closed fracture with routine healing: Secondary | ICD-10-CM | POA: Diagnosis not present

## 2023-07-22 DIAGNOSIS — R296 Repeated falls: Secondary | ICD-10-CM | POA: Diagnosis not present

## 2023-07-22 DIAGNOSIS — S72002A Fracture of unspecified part of neck of left femur, initial encounter for closed fracture: Secondary | ICD-10-CM | POA: Diagnosis not present

## 2023-07-22 DIAGNOSIS — I482 Chronic atrial fibrillation, unspecified: Secondary | ICD-10-CM | POA: Diagnosis not present

## 2023-07-22 DIAGNOSIS — I10 Essential (primary) hypertension: Secondary | ICD-10-CM | POA: Diagnosis not present

## 2023-07-22 DIAGNOSIS — S72042A Displaced fracture of base of neck of left femur, initial encounter for closed fracture: Secondary | ICD-10-CM | POA: Diagnosis not present

## 2023-07-22 DIAGNOSIS — R627 Adult failure to thrive: Secondary | ICD-10-CM | POA: Diagnosis not present

## 2023-07-22 DIAGNOSIS — R531 Weakness: Secondary | ICD-10-CM | POA: Diagnosis not present

## 2023-07-22 DIAGNOSIS — Z951 Presence of aortocoronary bypass graft: Secondary | ICD-10-CM | POA: Diagnosis not present

## 2023-07-22 DIAGNOSIS — Z8719 Personal history of other diseases of the digestive system: Secondary | ICD-10-CM | POA: Diagnosis not present

## 2023-07-22 DIAGNOSIS — M25562 Pain in left knee: Secondary | ICD-10-CM | POA: Diagnosis not present

## 2023-07-22 DIAGNOSIS — E1169 Type 2 diabetes mellitus with other specified complication: Secondary | ICD-10-CM | POA: Diagnosis not present

## 2023-07-22 DIAGNOSIS — I480 Paroxysmal atrial fibrillation: Secondary | ICD-10-CM | POA: Diagnosis not present

## 2023-07-22 DIAGNOSIS — I2510 Atherosclerotic heart disease of native coronary artery without angina pectoris: Secondary | ICD-10-CM | POA: Diagnosis not present

## 2023-07-22 DIAGNOSIS — E785 Hyperlipidemia, unspecified: Secondary | ICD-10-CM | POA: Diagnosis not present

## 2023-07-22 DIAGNOSIS — M62561 Muscle wasting and atrophy, not elsewhere classified, right lower leg: Secondary | ICD-10-CM | POA: Diagnosis not present

## 2023-07-22 DIAGNOSIS — D649 Anemia, unspecified: Secondary | ICD-10-CM | POA: Diagnosis not present

## 2023-07-22 DIAGNOSIS — M62562 Muscle wasting and atrophy, not elsewhere classified, left lower leg: Secondary | ICD-10-CM | POA: Diagnosis not present

## 2023-07-22 DIAGNOSIS — R2689 Other abnormalities of gait and mobility: Secondary | ICD-10-CM | POA: Diagnosis not present

## 2023-07-22 DIAGNOSIS — N390 Urinary tract infection, site not specified: Secondary | ICD-10-CM | POA: Diagnosis not present

## 2023-07-22 DIAGNOSIS — K219 Gastro-esophageal reflux disease without esophagitis: Secondary | ICD-10-CM | POA: Diagnosis not present

## 2023-07-22 DIAGNOSIS — M1712 Unilateral primary osteoarthritis, left knee: Secondary | ICD-10-CM | POA: Diagnosis not present

## 2023-07-22 DIAGNOSIS — G250 Essential tremor: Secondary | ICD-10-CM | POA: Diagnosis not present

## 2023-07-22 DIAGNOSIS — E119 Type 2 diabetes mellitus without complications: Secondary | ICD-10-CM | POA: Diagnosis not present

## 2023-07-22 DIAGNOSIS — Z299 Encounter for prophylactic measures, unspecified: Secondary | ICD-10-CM | POA: Diagnosis not present

## 2023-07-22 DIAGNOSIS — G8929 Other chronic pain: Secondary | ICD-10-CM | POA: Diagnosis not present

## 2023-07-22 LAB — RENAL FUNCTION PANEL
Albumin: 2.3 g/dL — ABNORMAL LOW (ref 3.5–5.0)
Anion gap: 8 (ref 5–15)
BUN: 15 mg/dL (ref 8–23)
CO2: 30 mmol/L (ref 22–32)
Calcium: 9.2 mg/dL (ref 8.9–10.3)
Chloride: 98 mmol/L (ref 98–111)
Creatinine, Ser: 0.44 mg/dL (ref 0.44–1.00)
GFR, Estimated: >60 mL/min (ref >60)
Glucose, Bld: 226 mg/dL — ABNORMAL HIGH (ref 70–99)
Phosphorus: 2 mg/dL — ABNORMAL LOW (ref 2.5–4.6)
Potassium: 3.4 mmol/L — ABNORMAL LOW (ref 3.5–5.1)
Sodium: 136 mmol/L (ref 135–145)

## 2023-07-22 NOTE — Care Management Important Message (Signed)
Important Message  Patient Details  Name: Virginia [name] MRN: [id number] Date of Birth: 09-07-1940   Important Message Given:  Yes - Medicare IM     Corey Harold 07/22/2023, 9:50 AM

## 2023-07-22 NOTE — TOC Transition Note (Signed)
Transition of Care [facility]) - Discharge Note   Patient Details  Name: Virginia [name] MRN: [id number] Date of Birth: 1940/08/31  Transition of Care [facility] CM/SW Contact:  Beather Arbour Phone Number: 07/22/2023, 9:49 AM   Clinical Narrative:    CSW spoke with Destiny the admission director at [facility] to confirm that can accept pt today. Destiny agreed and said that DC summary looked good and they can accept pt today. Son has been notified. Med necessity form completed and EMS has been called. TOC signing off.    Final next level of care: Skilled Nursing Facility Barriers to Discharge: Barriers Resolved   Patient Goals and CMS Choice Patient states their goals for this hospitalization and ongoing recovery are:: DC to SNF CMS Medicare.gov Compare Post Acute Care list provided to:: Patient Choice offered to / list presented to : Patient [place] ownership interest in [facility].provided to:: Patient    Discharge Placement                Patient to be transferred to facility by: Ambulance Name of family member notified: Son- Bill Patient and family notified of of transfer: 07/22/23  Discharge Plan and Services Additional resources added to the After Visit Summary for       Post Acute Care Choice: Skilled Nursing Facility                               Social Drivers of Health (SDOH) Interventions SDOH Screenings   Food Insecurity: No Food Insecurity (07/16/2023)  Housing: Low Risk  (07/16/2023)  Transportation Needs: No Transportation Needs (07/16/2023)  Utilities: Not At Risk (07/16/2023)  Alcohol Screen: Low Risk  (01/23/2023)  Depression (PHQ2-9): Medium Risk (06/03/2023)  Financial Resource Strain: Low Risk  (01/23/2023)  Physical Activity: Insufficiently Active (01/23/2023)  Social Connections: Moderately Isolated (07/16/2023)  Stress: No Stress Concern Present (01/23/2023)  Tobacco Use: Low Risk  (07/17/2023)  Health Literacy: Adequate Health  Literacy (01/23/2023)     Readmission Risk Interventions    07/22/2023    9:41 AM 07/19/2023   12:56 PM 07/18/2023    1:48 PM  Readmission Risk Prevention Plan  Transportation Screening Complete Complete Complete  PCP or Specialist Appt within 5-7 Days   Not Complete  Home Care Screening Complete Complete Complete  Medication Review (RN CM) Complete Complete Complete

## 2023-07-22 NOTE — Progress Notes (Signed)
Report called to [facility], patient IV removed from left hand without difficulty

## 2023-07-22 NOTE — Discharge Summary (Signed)
Physician Discharge Summary   Patient: Virginia [name] MRN: [id number] DOB: 1940/11/02  Admit date:     07/15/2023  Discharge date: 07/22/23  Discharge Physician: Vassie Loll   PCP: Junie Spencer, FNP   Recommendations at discharge:  Repeat CBC to follow hemoglobin trend/stability in 5 days Repeat basic metabolic panel to follow electrolytes and renal function Reassess blood pressure adjust antihypertensive treatment as needed Weightbearing as tolerated next DVT prophylaxis using Eliquis. Reassess blood pressure and further adjust antihypertensive regimen as needed  Discharge Diagnoses: Principal Problem:   Left displaced femoral neck fracture (HCC) Active Problems:   Type 2 diabetes mellitus (HCC)   Chronic atrial fibrillation (HCC)   GERD (gastroesophageal reflux disease)   Essential tremor E. Coli UTI  Brief Hospital admission course: As per H&P written by Dr. Mariea Clonts on 07/16/2023 Virginia [name]  is a 83 y.o. female with past medical history relevant for chronic atrial fibrillation on apixaban, GERD with prior history of GI bleed, CAD--post 3 Vessel CABG in 1999, DM2, chronic anemia, depression/anxiety, she osteoarthritis of the knees and HTN presents to the ED with left knee and left hip pain after falling at home -Denies loss of consciousness -Patient reports multiple falls over the last 3 weeks or so--- she has had ambulatory dysfunction partly due to significant left hip pain--- she gets around with a wheelchair at times -Patient denies chest pain palpitations or dizziness prior to falling or after falling, -EKG sinus rhythm -Chest x-ray without acute findings -CT of the left hip without contrast shows----IMPRESSION: 1. Acute impacted fracture of the left femoral neck. 2. Remote healed fractures of the left superior and inferior pubic Rami.   Knee x-rays without fractures -BMP with a creatinine of 0.67 potassium is 4.0 bicarb is 28 -CBC with a white count of 7.7  hemoglobin 15.8 and platelets of 143 -UA suggestive of UTI--- urine culture pending  Assessment and Plan: -Patient underwent Bipolar partial left hip replacement by Orthopedic service on 07/17/23; patient tolerated procedure well. -Continue heart healthy diet; Eliquis for DVT prophylaxis. -Continue as needed analgesics -Physical therapy recommending a skilled nursing facility for further rehab and conditioning. -Continue incentive spirometry -Outpatient follow-up with orthopedic service in 4 weeks.   2-E.coli UTI -Urinalysis suggestive of urinary tract infection -No fever, no leukocytosis and Patient reports no dysuria -Urine cultures demonstrated > 100,000 colonies of E. coli -adequately treated with rocephin initially and then oral keflex to complete therapy.   3-chronic anemia -Continue iron supplementation -No overt bleeding currently appreciated -Patient with prior history of GI bleed. -Continue to follow hemoglobin trend   4-gastroesophageal reflux disease -Continue PPI.   5-coronary artery disease -Status post three-vessel CABG in 1999 -Patient denies any chest pain -Continue Lipitor, metoprolol for secondary prevention -No aspirin as patient is on Eliquis for A-fib stroke prophylaxis. -Eliquis restarted after surgery   6-history of atrial fibrillation -Continue Eliquis for secondary prevention. -Rate controlled and is stable.   7-essential hypertension -Continue current antihypertensive agent -Patient blood pressure slightly elevated most likely associated with ongoing pain. -Continue to monitor vital signs and further adjust antihypertensive regimen as needed.   8-type 2 diabetes mellitus -Stable and compensated -Currently following diet control -Continue outpatient follow-up with PCP     Consultants: Orthopedic service Procedures performed: See below for x-ray reports. Disposition: Skilled nursing facility. Diet recommendation: Heart healthy  diet.   DISCHARGE MEDICATION: Allergies as of 07/22/2023   No Known Allergies      Medication List  STOP taking these medications    mirtazapine 7.5 MG tablet Commonly known as: REMERON   nystatin cream Commonly known as: MYCOSTATIN   predniSONE 10 MG tablet Commonly known as: DELTASONE       TAKE these medications    acetaminophen 500 MG tablet Commonly known as: TYLENOL Take 2 tablets (1,000 mg total) by mouth every 8 (eight) hours as needed. What changed:  when to take this reasons to take this   apixaban 5 MG Tabs tablet Commonly known as: Eliquis Take 1 tablet (5 mg total) by mouth 2 (two) times daily.   ascorbic acid 500 MG tablet Commonly known as: VITAMIN C Take 500 mg by mouth daily.   atorvastatin 80 MG tablet Commonly known as: LIPITOR TAKE 1 TABLET BY MOUTH DAILY   calcium-vitamin D 500-5 MG-MCG tablet Commonly known as: OSCAL WITH D Take 1 tablet by mouth.   cetirizine 10 MG tablet Commonly known as: ZYRTEC TAKE ONE (1) TABLET BY MOUTH EVERY DAY   Farxiga 10 MG Tabs tablet Generic drug: dapagliflozin propanediol TAKE 1 TABLET BY MOUTH DAILY  BEFORE BREAKFAST   ferrous sulfate 325 (65 FE) MG tablet Take 2 tablets (650 mg total) by mouth at bedtime.   furosemide 20 MG tablet Commonly known as: LASIX Take 1 tablet (20 mg total) by mouth daily.   lactose free nutrition Liqd Take 237 mLs by mouth daily.   megestrol 400 MG/10ML suspension Commonly known as: MEGACE Take 10 mLs (400 mg total) by mouth 2 (two) times daily.   metoprolol succinate 50 MG 24 hr tablet Commonly known as: TOPROL-XL TAKE 1 AND 1/2 TABLETS BY MOUTH  DAILY TAKE WITH OR IMMEDIATELY  FOLLOWING A MEAL What changed: See the new instructions.   pantoprazole 40 MG tablet Commonly known as: PROTONIX Take 1 tablet (40 mg total) by mouth daily.   PARoxetine 10 MG tablet Commonly known as: PAXIL TAKE 1 TABLET BY MOUTH EVERY  MORNING   polyethylene glycol 17 g  packet Commonly known as: MIRALAX / GLYCOLAX Take 17 g by mouth daily as needed for mild constipation.   traMADol 50 MG tablet Commonly known as: ULTRAM Take 1 tablet (50 mg total) by mouth every 12 (twelve) hours as needed for severe pain (pain score 7-10).        Contact information for follow-up providers     Junie Spencer, FNP. Schedule an appointment as soon as possible for a visit in 2 week(s).   Specialty: Family Medicine Why: After discharge from the skilled nursing facility. Contact information: [address] [place] [address] [phone]         Vickki Hearing, MD. Schedule an appointment as soon as possible for a visit in 4 week(s).   Specialties: Orthopedic Surgery, Radiology Contact information: [address] [place] [address] [phone]              Contact information for after-discharge care     Destination     [facility] Preferred SNF .   Service: Skilled Nursing Contact information: [address] [place] [address] [phone]                    Discharge Exam: Ceasar Mons Weights   07/15/23 1843 07/16/23 0948  Weight: 61.2 kg 58.6 kg   General exam: Alert, awake, following commands. Afebrile, no nausea, no vomiting. Currently not complaining of left hip pain.  Respiratory system: Clear to auscultation. Respiratory  effort normal.  Saturation on room air. Cardiovascular system: regular rate and rhythm. No murmurs, rubs, gallops. No JVD. Gastrointestinal system: Abdomen is nondistended, soft and nontender. No organomegaly or masses felt. Normal bowel sounds heard. Central nervous system: No focal neurological deficits. Extremities: No cyanosis or clubbing. +pedal pulses, surgical site without erythema or signs of infection Skin: No rashes, lesions or ulcers Psychiatry: Mood & affect appropriate.    Condition at discharge: stable and improved.  The results of  significant diagnostics from this hospitalization (including imaging, microbiology, ancillary and laboratory) are listed below for reference.   Imaging Studies: DG Pelvis Portable Result Date: 07/17/2023 CLINICAL DATA:  Status post probe placement. EXAM: PORTABLE PELVIS 1-2 VIEWS COMPARISON:  Preoperative imaging FINDINGS: Left hip hemiarthroplasty in expected alignment. No periprosthetic lucency or fracture. Recent postsurgical change includes air and edema in the soft tissues. Lateral skin staples in place. There is slight offset of the right femoral neck. IMPRESSION: 1. Left hip arthroplasty without immediate postoperative complication. 2. Slight offset of the right femoral neck, may be due to positioning. Recommend correlation for any right hip symptoms. Electronically Signed   By: Narda Rutherford M.D.   On: 07/17/2023 16:33   DG Chest Portable 1 View Result Date: 07/15/2023 CLINICAL DATA:  Weakness EXAM: PORTABLE CHEST 1 VIEW COMPARISON:  11/15/2022 FINDINGS: Chronic cardiomegaly post CABG. Unchanged mediastinal contours. No focal airspace disease, large pleural effusion or pneumothorax. Prior median sternotomy. Chronic changes about the right shoulder. IMPRESSION: Chronic cardiomegaly post CABG. No acute findings. Electronically Signed   By: Narda Rutherford M.D.   On: 07/15/2023 22:43   CT Hip Left Wo Contrast Result Date: 07/15/2023 CLINICAL DATA:  Hip trauma, fracture suspected, xray done EXAM: CT OF THE LEFT HIP WITHOUT CONTRAST TECHNIQUE: Multidetector CT imaging of the left hip was performed according to the standard protocol. Multiplanar CT image reconstructions were also generated. RADIATION DOSE REDUCTION: This exam was performed according to the departmental dose-optimization program which includes automated exposure control, adjustment of the mA and/or kV according to patient size and/or use of iterative reconstruction technique. COMPARISON:  Femur radiograph earlier today FINDINGS:  Bones/Joint/Cartilage Impacted fracture of the femoral neck. Minimal apex anterior angulation. No hip dislocation. There are remote healed fractures of the left superior and inferior pubic rami. There is degenerative change of the pubic symphysis and left sacroiliac joint. Ligaments Suboptimally assessed by CT. Muscles and Tendons No intramuscular hematoma. Soft tissues Tissue edema with probable small hematoma posteriorly. Probable calcified uterine fibroids. Colonic diverticulosis. IMPRESSION: 1. Acute impacted fracture of the left femoral neck. 2. Remote healed fractures of the left superior and inferior pubic rami. Electronically Signed   By: Narda Rutherford M.D.   On: 07/15/2023 22:42   DG Knee 2 Views Left Result Date: 07/15/2023 CLINICAL DATA:  Pain after fall, injury. EXAM: LEFT KNEE - 1-2 VIEW; LEFT FEMUR 2 VIEWS COMPARISON:  Knee radiograph 11/30/2021 FINDINGS: Knee: No acute fracture or dislocation. Moderate to advanced tricompartmental osteoarthritis. Suspected ossified intra-articular body posteriorly. Chronic calcifications anteriorly in the region of the quadriceps tendon. No large joint effusion. There is mild anterior soft tissue edema. Femur: No acute fracture. Hip alignment is maintained. No focal bone abnormalities. Vascular calcifications are seen. IMPRESSION: 1. No acute fracture or dislocation of the left knee or femur. 2. Moderate to advanced tricompartmental osteoarthritis of the knee. Electronically Signed   By: Narda Rutherford M.D.   On: 07/15/2023 19:56   DG FEMUR MIN 2 VIEWS LEFT Result Date: 07/15/2023  CLINICAL DATA:  Pain after fall, injury. EXAM: LEFT KNEE - 1-2 VIEW; LEFT FEMUR 2 VIEWS COMPARISON:  Knee radiograph 11/30/2021 FINDINGS: Knee: No acute fracture or dislocation. Moderate to advanced tricompartmental osteoarthritis. Suspected ossified intra-articular body posteriorly. Chronic calcifications anteriorly in the region of the quadriceps tendon. No large joint effusion.  There is mild anterior soft tissue edema. Femur: No acute fracture. Hip alignment is maintained. No focal bone abnormalities. Vascular calcifications are seen. IMPRESSION: 1. No acute fracture or dislocation of the left knee or femur. 2. Moderate to advanced tricompartmental osteoarthritis of the knee. Electronically Signed   By: Narda Rutherford M.D.   On: 07/15/2023 19:56    Microbiology: Results for orders placed or performed during the hospital encounter of 07/15/23  Urine Culture     Status: Abnormal   Collection Time: 07/15/23 10:00 AM   Specimen: Urine, Random  Result Value Ref Range Status   Specimen Description   Final    URINE, RANDOM Performed at [facility], [address], [place], [place] [address]    Special Requests   Final    NONE Performed at [facility], [address] [place], [place] [address]    Culture >=100,000 COLONIES/mL ESCHERICHIA COLI (A)  Final   Report Status 07/18/2023 FINAL  Final   Organism ID, Bacteria ESCHERICHIA COLI (A)  Final      Susceptibility   Escherichia coli - MIC*    AMPICILLIN >=32 RESISTANT Resistant     CEFAZOLIN <=4 SENSITIVE Sensitive     CEFEPIME <=0.12 SENSITIVE Sensitive     CEFTRIAXONE <=0.25 SENSITIVE Sensitive     CIPROFLOXACIN <=0.25 SENSITIVE Sensitive     GENTAMICIN <=1 SENSITIVE Sensitive     IMIPENEM <=0.25 SENSITIVE Sensitive     NITROFURANTOIN <=16 SENSITIVE Sensitive     TRIMETH/SULFA <=20 SENSITIVE Sensitive     AMPICILLIN/SULBACTAM 4 SENSITIVE Sensitive     PIP/TAZO <=4 SENSITIVE Sensitive ug/mL    * >=100,000 COLONIES/mL ESCHERICHIA COLI  MRSA Next Gen by PCR, Nasal     Status: None   Collection Time: 07/16/23 11:30 AM   Specimen: Nasal Mucosa; Nasal Swab  Result Value Ref Range Status   MRSA by PCR Next Gen NOT DETECTED NOT DETECTED Final    Comment: (NOTE) The GeneXpert MRSA Assay (FDA approved for NASAL specimens only), is one component of a comprehensive MRSA colonization surveillance program. It is not  intended to diagnose MRSA infection nor to guide or monitor treatment for MRSA infections. Test performance is not FDA approved in patients less than 6 years old. Performed at [facility], [address] [place], [place] [address]     Labs: CBC: Recent Labs  Lab 07/15/23 2224 07/17/23 0349 07/18/23 0809 07/19/23 0516 07/20/23 0502 07/21/23 0624  WBC 7.7 8.5 11.6* 10.4 8.8 10.4  NEUTROABS 5.6  --   --   --   --   --   HGB 15.8* 14.9 14.6 13.7 14.5 15.9*  HCT 46.8* 44.9 43.3 41.8 43.6 52.0*  MCV 97.3 96.8 97.7 97.4 99.1 103.8*  PLT 143* 146* 150 150 163 139*   Basic Metabolic Panel: Recent Labs  Lab 07/15/23 2224 07/17/23 0349 07/18/23 0809 07/22/23 0501  NA 139 137 137 136  K 4.0 3.5 4.4 3.4*  CL 100 101 102 98  CO2 28 27 24 30   GLUCOSE 168* 139* 196* 226*  BUN 16 24* 21 15  CREATININE 0.67 0.61 0.66 0.44  CALCIUM 10.0 9.5 9.5 9.2  PHOS  --   --   --  2.0*   Liver Function Tests: Recent Labs  Lab 07/22/23 0501  ALBUMIN 2.3*   CBG: Recent Labs  Lab 07/16/23 1126 07/16/23 1620 07/16/23 2118 07/17/23 1107 07/17/23 1528  GLUCAP 146* 160* 193* 154* 203*    Discharge time spent: greater than 30 minutes.  Signed: Vassie Loll, MD [facility] 07/22/2023

## 2023-07-23 NOTE — Consult Note (Signed)
[facility] Liaison Note  07/23/2023  Virginia [name] 05/01/1941 [id number]  Location: RN Hospital Liaison screened the patient remotely at [facility].  Insurance: [employer]   Virginia [name] is a 83 y.o. female who is a Optician, dispensing Care Patient of [place]endon Colonel, Edilia Bo, FNP [facility] Family Medicine. The patient was screened for readmission hospitalization with noted medium risk score for unplanned readmission risk with 1 IP/1 ED.  The patient was assessed for potential Care Management service needs for post hospital transition for care coordination. Review of patient's electronic medical record reveals patient was admitted for left Displaced femoral neck. Pt recommended for SNF level of care and [facility]. Liaison will make a referral for PAC-RN to follow up accordingly for any community needs if presented.  Plan: [facility] Liaison will continue to follow progress and disposition to asess for post hospital community care coordination/management needs.  Referral request for community care coordination:  Liaison  will refer to PAC-RN for follow up at the SNF.    [facility]BCI Care Management/Population Health does not replace or interfere with any arrangements made by the Inpatient Transition of Care team.   For questions contact:   Virginia Cousin, RN, [facility] Liaison [place]   [facility], Population Health Office Hours MTWF  8:00 am-6:00 pm Direct Dial: [phone] mobile [phone] [Office toll free line] Office Hours are M-F 8:30 - 5 pm [name]

## 2023-07-24 ENCOUNTER — Other Ambulatory Visit: Payer: Self-pay | Admitting: Family

## 2023-07-24 ENCOUNTER — Ambulatory Visit: Payer: Medicare Other | Admitting: Orthopaedic Surgery

## 2023-07-24 DIAGNOSIS — I509 Heart failure, unspecified: Secondary | ICD-10-CM

## 2023-07-27 DIAGNOSIS — Z299 Encounter for prophylactic measures, unspecified: Secondary | ICD-10-CM | POA: Diagnosis not present

## 2023-07-31 ENCOUNTER — Ambulatory Visit: Payer: Medicare Other | Admitting: Orthopaedic Surgery

## 2023-07-31 ENCOUNTER — Encounter: Payer: Self-pay | Admitting: Orthopaedic Surgery

## 2023-07-31 VITALS — Ht 65.0 in | Wt 150.0 lb

## 2023-07-31 DIAGNOSIS — M25562 Pain in left knee: Secondary | ICD-10-CM

## 2023-07-31 DIAGNOSIS — G8929 Other chronic pain: Secondary | ICD-10-CM

## 2023-07-31 MED ORDER — METHYLPREDNISOLONE ACETATE 40 MG/ML IJ SUSP
40.0000 mg | Freq: Once | INTRAMUSCULAR | Status: AC
  Administered 2023-07-31: 40 mg via INTRA_ARTICULAR

## 2023-07-31 NOTE — Progress Notes (Signed)
She had recent hip surgery by Dr. Romeo Apple.  She is at [facility].  She is in wheelchair. She has recurrent effusion of the left knee.  She has limited ROM of the left knee, 10 to 90 degrees, lacking full extension.  NV intact.  PROCEDURE NOTE:  The patient requests injections of the left knee , verbal consent was obtained.  The left knee was prepped appropriately after time out was performed.   Sterile technique was observed and injection of 1 cc of DepoMedrol 40 mg with several cc's of plain xylocaine. Anesthesia was provided by ethyl chloride and a 20-gauge needle was used to inject the knee area. The injection was tolerated well.  A band aid dressing was applied.  The patient was advised to apply ice later today and tomorrow to the injection sight as needed.  Encounter Diagnosis  Name Primary?   Chronic pain of left knee Yes   Return prn.  Forms completed for nursing home.  Call if any problem.  Precautions discussed.  Electronically Signed Darreld Mclean, MD 2/12/20258:28 AM

## 2023-07-31 NOTE — Addendum Note (Signed)
Addended by: Michaele Offer on: 07/31/2023 08:38 AM   Modules accepted: Orders

## 2023-07-31 NOTE — Patient Instructions (Signed)
PRN

## 2023-08-12 ENCOUNTER — Encounter: Payer: Medicare Other | Admitting: Orthopedic Surgery

## 2023-08-12 ENCOUNTER — Telehealth: Payer: Self-pay | Admitting: Orthopedic Surgery

## 2023-08-12 NOTE — Telephone Encounter (Signed)
 Health co-ordinator   SAID TO Call  [place]   [place] [phone]

## 2023-08-12 NOTE — Telephone Encounter (Signed)
 Dr. Mort Sawyers pt - pt has an appointment today for staple removal.  Spoke w/Ciara w/[facility] [phone], she stated that the family is cancelling the appointment for today because they can't meet her here.  [facility] is requesting an order to remove her staples.  They would like orders faxed to [phone] and they would like a call back advising.

## 2023-08-14 ENCOUNTER — Telehealth: Payer: Self-pay | Admitting: Orthopedic Surgery

## 2023-08-14 NOTE — Telephone Encounter (Signed)
 Returned a called to [place] at [facility] [phone], lvm for her to call me back.  She is wanting to schedule an appointment.

## 2023-08-18 DIAGNOSIS — M62522 Muscle wasting and atrophy, not elsewhere classified, left upper arm: Secondary | ICD-10-CM | POA: Diagnosis not present

## 2023-08-18 DIAGNOSIS — M62521 Muscle wasting and atrophy, not elsewhere classified, right upper arm: Secondary | ICD-10-CM | POA: Diagnosis not present

## 2023-08-18 DIAGNOSIS — M62562 Muscle wasting and atrophy, not elsewhere classified, left lower leg: Secondary | ICD-10-CM | POA: Diagnosis not present

## 2023-08-18 DIAGNOSIS — R2689 Other abnormalities of gait and mobility: Secondary | ICD-10-CM | POA: Diagnosis not present

## 2023-08-18 DIAGNOSIS — M62561 Muscle wasting and atrophy, not elsewhere classified, right lower leg: Secondary | ICD-10-CM | POA: Diagnosis not present

## 2023-08-19 DIAGNOSIS — M62562 Muscle wasting and atrophy, not elsewhere classified, left lower leg: Secondary | ICD-10-CM | POA: Diagnosis not present

## 2023-08-19 DIAGNOSIS — M62522 Muscle wasting and atrophy, not elsewhere classified, left upper arm: Secondary | ICD-10-CM | POA: Diagnosis not present

## 2023-08-19 DIAGNOSIS — M62561 Muscle wasting and atrophy, not elsewhere classified, right lower leg: Secondary | ICD-10-CM | POA: Diagnosis not present

## 2023-08-19 DIAGNOSIS — M62521 Muscle wasting and atrophy, not elsewhere classified, right upper arm: Secondary | ICD-10-CM | POA: Diagnosis not present

## 2023-08-19 DIAGNOSIS — R2689 Other abnormalities of gait and mobility: Secondary | ICD-10-CM | POA: Diagnosis not present

## 2023-08-20 DIAGNOSIS — R2689 Other abnormalities of gait and mobility: Secondary | ICD-10-CM | POA: Diagnosis not present

## 2023-08-20 DIAGNOSIS — M62561 Muscle wasting and atrophy, not elsewhere classified, right lower leg: Secondary | ICD-10-CM | POA: Diagnosis not present

## 2023-08-20 DIAGNOSIS — M62522 Muscle wasting and atrophy, not elsewhere classified, left upper arm: Secondary | ICD-10-CM | POA: Diagnosis not present

## 2023-08-20 DIAGNOSIS — M62562 Muscle wasting and atrophy, not elsewhere classified, left lower leg: Secondary | ICD-10-CM | POA: Diagnosis not present

## 2023-08-20 DIAGNOSIS — M62521 Muscle wasting and atrophy, not elsewhere classified, right upper arm: Secondary | ICD-10-CM | POA: Diagnosis not present

## 2023-08-21 DIAGNOSIS — R2689 Other abnormalities of gait and mobility: Secondary | ICD-10-CM | POA: Diagnosis not present

## 2023-08-21 DIAGNOSIS — M62562 Muscle wasting and atrophy, not elsewhere classified, left lower leg: Secondary | ICD-10-CM | POA: Diagnosis not present

## 2023-08-21 DIAGNOSIS — M62521 Muscle wasting and atrophy, not elsewhere classified, right upper arm: Secondary | ICD-10-CM | POA: Diagnosis not present

## 2023-08-21 DIAGNOSIS — M62522 Muscle wasting and atrophy, not elsewhere classified, left upper arm: Secondary | ICD-10-CM | POA: Diagnosis not present

## 2023-08-21 DIAGNOSIS — M62561 Muscle wasting and atrophy, not elsewhere classified, right lower leg: Secondary | ICD-10-CM | POA: Diagnosis not present

## 2023-08-22 ENCOUNTER — Ambulatory Visit (INDEPENDENT_AMBULATORY_CARE_PROVIDER_SITE_OTHER): Payer: Medicare Other | Admitting: Orthopedic Surgery

## 2023-08-22 DIAGNOSIS — M62562 Muscle wasting and atrophy, not elsewhere classified, left lower leg: Secondary | ICD-10-CM | POA: Diagnosis not present

## 2023-08-22 DIAGNOSIS — M62522 Muscle wasting and atrophy, not elsewhere classified, left upper arm: Secondary | ICD-10-CM | POA: Diagnosis not present

## 2023-08-22 DIAGNOSIS — M62521 Muscle wasting and atrophy, not elsewhere classified, right upper arm: Secondary | ICD-10-CM | POA: Diagnosis not present

## 2023-08-22 DIAGNOSIS — M62561 Muscle wasting and atrophy, not elsewhere classified, right lower leg: Secondary | ICD-10-CM | POA: Diagnosis not present

## 2023-08-22 DIAGNOSIS — R2689 Other abnormalities of gait and mobility: Secondary | ICD-10-CM | POA: Diagnosis not present

## 2023-08-22 DIAGNOSIS — S72002A Fracture of unspecified part of neck of left femur, initial encounter for closed fracture: Secondary | ICD-10-CM

## 2023-08-22 NOTE — Progress Notes (Signed)
 Post op Office Visit Note   Patient: Virginia [name]           Date of Birth: August 12, 1940           MRN: [id number] Visit Date: 08/22/2023 Requested by: Junie Spencer, FNP [address] [place],  [place] [address] PCP: Junie Spencer, FNP   Encounter Diagnosis  Name Primary?   Left displaced femoral neck fracture (HCC) bipolar replacement 07/17/23 Yes   Chief Complaint  Patient presents with   Routine Post Op    L hip DOS 07/17/23   No Known Allergies PAIN MED: Tylenol DVTPREV Eliquis  Surgical incision is clean dry and intact leg lengths are equal.  Hip flexion equals 120 degrees without pain IMAGING: No results found.   ASSESSMENT AND PLAN:  Seems to be stable at 6 weeks post bipolar left hip for femoral neck fracture  Patient was able to stand on her own although her get up and go test would be measured as slow  Recommend physical therapy, weight-bear as tolerated  Direct lateral hip precautions  Send PT notes on next visit

## 2023-08-23 DIAGNOSIS — M62521 Muscle wasting and atrophy, not elsewhere classified, right upper arm: Secondary | ICD-10-CM | POA: Diagnosis not present

## 2023-08-23 DIAGNOSIS — M62562 Muscle wasting and atrophy, not elsewhere classified, left lower leg: Secondary | ICD-10-CM | POA: Diagnosis not present

## 2023-08-23 DIAGNOSIS — R2689 Other abnormalities of gait and mobility: Secondary | ICD-10-CM | POA: Diagnosis not present

## 2023-08-23 DIAGNOSIS — M62522 Muscle wasting and atrophy, not elsewhere classified, left upper arm: Secondary | ICD-10-CM | POA: Diagnosis not present

## 2023-08-23 DIAGNOSIS — M62561 Muscle wasting and atrophy, not elsewhere classified, right lower leg: Secondary | ICD-10-CM | POA: Diagnosis not present

## 2023-08-26 ENCOUNTER — Telehealth: Payer: Self-pay

## 2023-08-26 DIAGNOSIS — M62521 Muscle wasting and atrophy, not elsewhere classified, right upper arm: Secondary | ICD-10-CM | POA: Diagnosis not present

## 2023-08-26 DIAGNOSIS — M62562 Muscle wasting and atrophy, not elsewhere classified, left lower leg: Secondary | ICD-10-CM | POA: Diagnosis not present

## 2023-08-26 DIAGNOSIS — R2689 Other abnormalities of gait and mobility: Secondary | ICD-10-CM | POA: Diagnosis not present

## 2023-08-26 DIAGNOSIS — M62561 Muscle wasting and atrophy, not elsewhere classified, right lower leg: Secondary | ICD-10-CM | POA: Diagnosis not present

## 2023-08-26 DIAGNOSIS — M62522 Muscle wasting and atrophy, not elsewhere classified, left upper arm: Secondary | ICD-10-CM | POA: Diagnosis not present

## 2023-08-26 NOTE — Telephone Encounter (Signed)
 Copied from CRM [id number]. Topic: General - Other >> Aug 26, 2023  9:56 AM Shon Hale wrote: Reason for CRM: Patient's sister called and cancelled upcoming appointments for patient. Patient fell and broke hip, fell 07/14/2023. Had hip replacement that week and then was moved to [facility].   Sister requesting PCP be informed.

## 2023-08-27 DIAGNOSIS — M62521 Muscle wasting and atrophy, not elsewhere classified, right upper arm: Secondary | ICD-10-CM | POA: Diagnosis not present

## 2023-08-27 DIAGNOSIS — M62522 Muscle wasting and atrophy, not elsewhere classified, left upper arm: Secondary | ICD-10-CM | POA: Diagnosis not present

## 2023-08-27 DIAGNOSIS — M62562 Muscle wasting and atrophy, not elsewhere classified, left lower leg: Secondary | ICD-10-CM | POA: Diagnosis not present

## 2023-08-27 DIAGNOSIS — M62561 Muscle wasting and atrophy, not elsewhere classified, right lower leg: Secondary | ICD-10-CM | POA: Diagnosis not present

## 2023-08-27 DIAGNOSIS — R2689 Other abnormalities of gait and mobility: Secondary | ICD-10-CM | POA: Diagnosis not present

## 2023-08-28 DIAGNOSIS — M62521 Muscle wasting and atrophy, not elsewhere classified, right upper arm: Secondary | ICD-10-CM | POA: Diagnosis not present

## 2023-08-28 DIAGNOSIS — R2689 Other abnormalities of gait and mobility: Secondary | ICD-10-CM | POA: Diagnosis not present

## 2023-08-28 DIAGNOSIS — M62522 Muscle wasting and atrophy, not elsewhere classified, left upper arm: Secondary | ICD-10-CM | POA: Diagnosis not present

## 2023-08-28 DIAGNOSIS — M62562 Muscle wasting and atrophy, not elsewhere classified, left lower leg: Secondary | ICD-10-CM | POA: Diagnosis not present

## 2023-08-28 DIAGNOSIS — M62561 Muscle wasting and atrophy, not elsewhere classified, right lower leg: Secondary | ICD-10-CM | POA: Diagnosis not present

## 2023-08-29 DIAGNOSIS — M62521 Muscle wasting and atrophy, not elsewhere classified, right upper arm: Secondary | ICD-10-CM | POA: Diagnosis not present

## 2023-08-29 DIAGNOSIS — M62561 Muscle wasting and atrophy, not elsewhere classified, right lower leg: Secondary | ICD-10-CM | POA: Diagnosis not present

## 2023-08-29 DIAGNOSIS — M62522 Muscle wasting and atrophy, not elsewhere classified, left upper arm: Secondary | ICD-10-CM | POA: Diagnosis not present

## 2023-08-29 DIAGNOSIS — M62562 Muscle wasting and atrophy, not elsewhere classified, left lower leg: Secondary | ICD-10-CM | POA: Diagnosis not present

## 2023-08-29 DIAGNOSIS — R2689 Other abnormalities of gait and mobility: Secondary | ICD-10-CM | POA: Diagnosis not present

## 2023-08-30 DIAGNOSIS — M62521 Muscle wasting and atrophy, not elsewhere classified, right upper arm: Secondary | ICD-10-CM | POA: Diagnosis not present

## 2023-08-30 DIAGNOSIS — R2689 Other abnormalities of gait and mobility: Secondary | ICD-10-CM | POA: Diagnosis not present

## 2023-08-30 DIAGNOSIS — M62522 Muscle wasting and atrophy, not elsewhere classified, left upper arm: Secondary | ICD-10-CM | POA: Diagnosis not present

## 2023-08-30 DIAGNOSIS — M62562 Muscle wasting and atrophy, not elsewhere classified, left lower leg: Secondary | ICD-10-CM | POA: Diagnosis not present

## 2023-08-30 DIAGNOSIS — M62561 Muscle wasting and atrophy, not elsewhere classified, right lower leg: Secondary | ICD-10-CM | POA: Diagnosis not present

## 2023-09-02 ENCOUNTER — Ambulatory Visit: Payer: Medicare Other | Admitting: Family

## 2023-09-02 ENCOUNTER — Other Ambulatory Visit: Payer: Medicare Other

## 2023-09-02 DIAGNOSIS — M62521 Muscle wasting and atrophy, not elsewhere classified, right upper arm: Secondary | ICD-10-CM | POA: Diagnosis not present

## 2023-09-02 DIAGNOSIS — R2689 Other abnormalities of gait and mobility: Secondary | ICD-10-CM | POA: Diagnosis not present

## 2023-09-02 DIAGNOSIS — M62562 Muscle wasting and atrophy, not elsewhere classified, left lower leg: Secondary | ICD-10-CM | POA: Diagnosis not present

## 2023-09-02 DIAGNOSIS — M62561 Muscle wasting and atrophy, not elsewhere classified, right lower leg: Secondary | ICD-10-CM | POA: Diagnosis not present

## 2023-09-02 DIAGNOSIS — M62522 Muscle wasting and atrophy, not elsewhere classified, left upper arm: Secondary | ICD-10-CM | POA: Diagnosis not present

## 2023-09-03 DIAGNOSIS — R2689 Other abnormalities of gait and mobility: Secondary | ICD-10-CM | POA: Diagnosis not present

## 2023-09-03 DIAGNOSIS — M62521 Muscle wasting and atrophy, not elsewhere classified, right upper arm: Secondary | ICD-10-CM | POA: Diagnosis not present

## 2023-09-03 DIAGNOSIS — M62562 Muscle wasting and atrophy, not elsewhere classified, left lower leg: Secondary | ICD-10-CM | POA: Diagnosis not present

## 2023-09-03 DIAGNOSIS — M62561 Muscle wasting and atrophy, not elsewhere classified, right lower leg: Secondary | ICD-10-CM | POA: Diagnosis not present

## 2023-09-03 DIAGNOSIS — M62522 Muscle wasting and atrophy, not elsewhere classified, left upper arm: Secondary | ICD-10-CM | POA: Diagnosis not present

## 2023-09-04 DIAGNOSIS — M62562 Muscle wasting and atrophy, not elsewhere classified, left lower leg: Secondary | ICD-10-CM | POA: Diagnosis not present

## 2023-09-04 DIAGNOSIS — M62561 Muscle wasting and atrophy, not elsewhere classified, right lower leg: Secondary | ICD-10-CM | POA: Diagnosis not present

## 2023-09-04 DIAGNOSIS — M62522 Muscle wasting and atrophy, not elsewhere classified, left upper arm: Secondary | ICD-10-CM | POA: Diagnosis not present

## 2023-09-04 DIAGNOSIS — M62521 Muscle wasting and atrophy, not elsewhere classified, right upper arm: Secondary | ICD-10-CM | POA: Diagnosis not present

## 2023-09-04 DIAGNOSIS — R2689 Other abnormalities of gait and mobility: Secondary | ICD-10-CM | POA: Diagnosis not present

## 2023-09-05 DIAGNOSIS — R2689 Other abnormalities of gait and mobility: Secondary | ICD-10-CM | POA: Diagnosis not present

## 2023-09-05 DIAGNOSIS — M62561 Muscle wasting and atrophy, not elsewhere classified, right lower leg: Secondary | ICD-10-CM | POA: Diagnosis not present

## 2023-09-05 DIAGNOSIS — M62522 Muscle wasting and atrophy, not elsewhere classified, left upper arm: Secondary | ICD-10-CM | POA: Diagnosis not present

## 2023-09-05 DIAGNOSIS — M62562 Muscle wasting and atrophy, not elsewhere classified, left lower leg: Secondary | ICD-10-CM | POA: Diagnosis not present

## 2023-09-05 DIAGNOSIS — M62521 Muscle wasting and atrophy, not elsewhere classified, right upper arm: Secondary | ICD-10-CM | POA: Diagnosis not present

## 2023-09-06 DIAGNOSIS — M62521 Muscle wasting and atrophy, not elsewhere classified, right upper arm: Secondary | ICD-10-CM | POA: Diagnosis not present

## 2023-09-06 DIAGNOSIS — M62562 Muscle wasting and atrophy, not elsewhere classified, left lower leg: Secondary | ICD-10-CM | POA: Diagnosis not present

## 2023-09-06 DIAGNOSIS — M62561 Muscle wasting and atrophy, not elsewhere classified, right lower leg: Secondary | ICD-10-CM | POA: Diagnosis not present

## 2023-09-06 DIAGNOSIS — R2689 Other abnormalities of gait and mobility: Secondary | ICD-10-CM | POA: Diagnosis not present

## 2023-09-06 DIAGNOSIS — M62522 Muscle wasting and atrophy, not elsewhere classified, left upper arm: Secondary | ICD-10-CM | POA: Diagnosis not present

## 2023-09-09 DIAGNOSIS — M62522 Muscle wasting and atrophy, not elsewhere classified, left upper arm: Secondary | ICD-10-CM | POA: Diagnosis not present

## 2023-09-09 DIAGNOSIS — I480 Paroxysmal atrial fibrillation: Secondary | ICD-10-CM | POA: Diagnosis not present

## 2023-09-09 DIAGNOSIS — S72042A Displaced fracture of base of neck of left femur, initial encounter for closed fracture: Secondary | ICD-10-CM | POA: Diagnosis not present

## 2023-09-09 DIAGNOSIS — I10 Essential (primary) hypertension: Secondary | ICD-10-CM | POA: Diagnosis not present

## 2023-09-09 DIAGNOSIS — M62561 Muscle wasting and atrophy, not elsewhere classified, right lower leg: Secondary | ICD-10-CM | POA: Diagnosis not present

## 2023-09-09 DIAGNOSIS — R531 Weakness: Secondary | ICD-10-CM | POA: Diagnosis not present

## 2023-09-09 DIAGNOSIS — E1169 Type 2 diabetes mellitus with other specified complication: Secondary | ICD-10-CM | POA: Diagnosis not present

## 2023-09-09 DIAGNOSIS — M62521 Muscle wasting and atrophy, not elsewhere classified, right upper arm: Secondary | ICD-10-CM | POA: Diagnosis not present

## 2023-09-09 DIAGNOSIS — R2689 Other abnormalities of gait and mobility: Secondary | ICD-10-CM | POA: Diagnosis not present

## 2023-09-09 DIAGNOSIS — K219 Gastro-esophageal reflux disease without esophagitis: Secondary | ICD-10-CM | POA: Diagnosis not present

## 2023-09-09 DIAGNOSIS — M62562 Muscle wasting and atrophy, not elsewhere classified, left lower leg: Secondary | ICD-10-CM | POA: Diagnosis not present

## 2023-09-10 DIAGNOSIS — M62522 Muscle wasting and atrophy, not elsewhere classified, left upper arm: Secondary | ICD-10-CM | POA: Diagnosis not present

## 2023-09-10 DIAGNOSIS — M62521 Muscle wasting and atrophy, not elsewhere classified, right upper arm: Secondary | ICD-10-CM | POA: Diagnosis not present

## 2023-09-10 DIAGNOSIS — R2689 Other abnormalities of gait and mobility: Secondary | ICD-10-CM | POA: Diagnosis not present

## 2023-09-10 DIAGNOSIS — M62562 Muscle wasting and atrophy, not elsewhere classified, left lower leg: Secondary | ICD-10-CM | POA: Diagnosis not present

## 2023-09-10 DIAGNOSIS — M62561 Muscle wasting and atrophy, not elsewhere classified, right lower leg: Secondary | ICD-10-CM | POA: Diagnosis not present

## 2023-09-11 DIAGNOSIS — M62561 Muscle wasting and atrophy, not elsewhere classified, right lower leg: Secondary | ICD-10-CM | POA: Diagnosis not present

## 2023-09-11 DIAGNOSIS — M62562 Muscle wasting and atrophy, not elsewhere classified, left lower leg: Secondary | ICD-10-CM | POA: Diagnosis not present

## 2023-09-11 DIAGNOSIS — R2689 Other abnormalities of gait and mobility: Secondary | ICD-10-CM | POA: Diagnosis not present

## 2023-09-11 DIAGNOSIS — E782 Mixed hyperlipidemia: Secondary | ICD-10-CM | POA: Diagnosis not present

## 2023-09-11 DIAGNOSIS — M62521 Muscle wasting and atrophy, not elsewhere classified, right upper arm: Secondary | ICD-10-CM | POA: Diagnosis not present

## 2023-09-11 DIAGNOSIS — M62522 Muscle wasting and atrophy, not elsewhere classified, left upper arm: Secondary | ICD-10-CM | POA: Diagnosis not present

## 2023-09-12 DIAGNOSIS — M62562 Muscle wasting and atrophy, not elsewhere classified, left lower leg: Secondary | ICD-10-CM | POA: Diagnosis not present

## 2023-09-12 DIAGNOSIS — M62521 Muscle wasting and atrophy, not elsewhere classified, right upper arm: Secondary | ICD-10-CM | POA: Diagnosis not present

## 2023-09-12 DIAGNOSIS — R2689 Other abnormalities of gait and mobility: Secondary | ICD-10-CM | POA: Diagnosis not present

## 2023-09-12 DIAGNOSIS — M62561 Muscle wasting and atrophy, not elsewhere classified, right lower leg: Secondary | ICD-10-CM | POA: Diagnosis not present

## 2023-09-12 DIAGNOSIS — M62522 Muscle wasting and atrophy, not elsewhere classified, left upper arm: Secondary | ICD-10-CM | POA: Diagnosis not present

## 2023-09-13 DIAGNOSIS — R2689 Other abnormalities of gait and mobility: Secondary | ICD-10-CM | POA: Diagnosis not present

## 2023-09-13 DIAGNOSIS — M62522 Muscle wasting and atrophy, not elsewhere classified, left upper arm: Secondary | ICD-10-CM | POA: Diagnosis not present

## 2023-09-13 DIAGNOSIS — M62562 Muscle wasting and atrophy, not elsewhere classified, left lower leg: Secondary | ICD-10-CM | POA: Diagnosis not present

## 2023-09-13 DIAGNOSIS — M62521 Muscle wasting and atrophy, not elsewhere classified, right upper arm: Secondary | ICD-10-CM | POA: Diagnosis not present

## 2023-09-13 DIAGNOSIS — M62561 Muscle wasting and atrophy, not elsewhere classified, right lower leg: Secondary | ICD-10-CM | POA: Diagnosis not present

## 2023-09-15 DIAGNOSIS — M79641 Pain in right hand: Secondary | ICD-10-CM | POA: Diagnosis not present

## 2023-09-20 DIAGNOSIS — M79643 Pain in unspecified hand: Secondary | ICD-10-CM | POA: Diagnosis not present

## 2023-09-20 DIAGNOSIS — M7989 Other specified soft tissue disorders: Secondary | ICD-10-CM | POA: Diagnosis not present

## 2023-09-27 DIAGNOSIS — L603 Nail dystrophy: Secondary | ICD-10-CM | POA: Diagnosis not present

## 2023-09-27 DIAGNOSIS — I739 Peripheral vascular disease, unspecified: Secondary | ICD-10-CM | POA: Diagnosis not present

## 2023-09-27 DIAGNOSIS — L602 Onychogryphosis: Secondary | ICD-10-CM | POA: Diagnosis not present

## 2023-10-03 ENCOUNTER — Ambulatory Visit (INDEPENDENT_AMBULATORY_CARE_PROVIDER_SITE_OTHER): Admitting: Orthopedic Surgery

## 2023-10-03 DIAGNOSIS — S72002A Fracture of unspecified part of neck of left femur, initial encounter for closed fracture: Secondary | ICD-10-CM

## 2023-10-03 NOTE — Progress Notes (Signed)
   There were no vitals taken for this visit.  There is no height or weight on file to calculate BMI.  Chief Complaint  Patient presents with   Post-op Follow-up    Left hip     Encounter Diagnosis  Name Primary?   Left displaced femoral neck fracture (HCC) bipolar replacement 07/17/23 Yes    DOI/DOS/ Date: 07/17/23  Unchanged/ Unknown. Patient is confused/ she is seated comfortably in wheelchair today

## 2023-10-03 NOTE — Progress Notes (Signed)
 Patient ID: Virginia [name], female   DOB: 01/30/1941, 83 y.o.   MRN: [id number]  Encounter Diagnosis  Name Primary?   Left displaced femoral neck fracture (HCC) bipolar replacement 07/17/23 Yes    Chief Complaint  Patient presents with   Post-op Follow-up    Left hip     The patient is accompanied by 2 employees from the nursing home [facility]  The patient is awake she is alert but not conversive and confused  Status post left bipolar hip replacement doing well  Knee flexion hip flexion hip rotation did not cause any pain leg lengths were equal alignment was normal and I discharged the patient no follow-up needed  She is weightbearing as tolerated with anterior hip precautions i.e. no external rotation or extension of the hip

## 2023-10-03 NOTE — Patient Instructions (Signed)
WEIGHT BEARING AS TOLERATED 

## 2023-10-10 DIAGNOSIS — I10 Essential (primary) hypertension: Secondary | ICD-10-CM | POA: Diagnosis not present

## 2023-10-10 DIAGNOSIS — S72042A Displaced fracture of base of neck of left femur, initial encounter for closed fracture: Secondary | ICD-10-CM | POA: Diagnosis not present

## 2023-10-10 DIAGNOSIS — K219 Gastro-esophageal reflux disease without esophagitis: Secondary | ICD-10-CM | POA: Diagnosis not present

## 2023-10-10 DIAGNOSIS — E1169 Type 2 diabetes mellitus with other specified complication: Secondary | ICD-10-CM | POA: Diagnosis not present

## 2023-10-10 DIAGNOSIS — I480 Paroxysmal atrial fibrillation: Secondary | ICD-10-CM | POA: Diagnosis not present

## 2023-10-10 DIAGNOSIS — R531 Weakness: Secondary | ICD-10-CM | POA: Diagnosis not present

## 2023-10-26 ENCOUNTER — Emergency Department (HOSPITAL_COMMUNITY)

## 2023-10-26 ENCOUNTER — Other Ambulatory Visit: Payer: Self-pay

## 2023-10-26 ENCOUNTER — Emergency Department (HOSPITAL_COMMUNITY)
Admission: EM | Admit: 2023-10-26 | Discharge: 2023-10-26 | Disposition: A | Discharge status: Home / Self Care | Attending: Student | Admitting: Student

## 2023-10-26 ENCOUNTER — Encounter (HOSPITAL_COMMUNITY): Payer: Self-pay | Admitting: Emergency Medicine

## 2023-10-26 DIAGNOSIS — Z7984 Long term (current) use of oral hypoglycemic drugs: Secondary | ICD-10-CM | POA: Diagnosis not present

## 2023-10-26 DIAGNOSIS — Z043 Encounter for examination and observation following other accident: Secondary | ICD-10-CM | POA: Diagnosis not present

## 2023-10-26 DIAGNOSIS — I110 Hypertensive heart disease with heart failure: Secondary | ICD-10-CM | POA: Insufficient documentation

## 2023-10-26 DIAGNOSIS — I6529 Occlusion and stenosis of unspecified carotid artery: Secondary | ICD-10-CM | POA: Diagnosis not present

## 2023-10-26 DIAGNOSIS — W19XXXA Unspecified fall, initial encounter: Secondary | ICD-10-CM | POA: Diagnosis not present

## 2023-10-26 DIAGNOSIS — Z96642 Presence of left artificial hip joint: Secondary | ICD-10-CM | POA: Insufficient documentation

## 2023-10-26 DIAGNOSIS — I2510 Atherosclerotic heart disease of native coronary artery without angina pectoris: Secondary | ICD-10-CM | POA: Insufficient documentation

## 2023-10-26 DIAGNOSIS — Z7401 Bed confinement status: Secondary | ICD-10-CM | POA: Diagnosis not present

## 2023-10-26 DIAGNOSIS — Z79899 Other long term (current) drug therapy: Secondary | ICD-10-CM | POA: Diagnosis not present

## 2023-10-26 DIAGNOSIS — M50321 Other cervical disc degeneration at C4-C5 level: Secondary | ICD-10-CM | POA: Diagnosis not present

## 2023-10-26 DIAGNOSIS — S2242XA Multiple fractures of ribs, left side, initial encounter for closed fracture: Secondary | ICD-10-CM | POA: Diagnosis not present

## 2023-10-26 DIAGNOSIS — S0990XA Unspecified injury of head, initial encounter: Secondary | ICD-10-CM | POA: Diagnosis not present

## 2023-10-26 DIAGNOSIS — I6782 Cerebral ischemia: Secondary | ICD-10-CM | POA: Diagnosis not present

## 2023-10-26 DIAGNOSIS — Z955 Presence of coronary angioplasty implant and graft: Secondary | ICD-10-CM | POA: Diagnosis not present

## 2023-10-26 DIAGNOSIS — M129 Arthropathy, unspecified: Secondary | ICD-10-CM | POA: Diagnosis not present

## 2023-10-26 DIAGNOSIS — E119 Type 2 diabetes mellitus without complications: Secondary | ICD-10-CM | POA: Insufficient documentation

## 2023-10-26 DIAGNOSIS — R531 Weakness: Secondary | ICD-10-CM | POA: Diagnosis not present

## 2023-10-26 DIAGNOSIS — Z7901 Long term (current) use of anticoagulants: Secondary | ICD-10-CM | POA: Insufficient documentation

## 2023-10-26 DIAGNOSIS — I5023 Acute on chronic systolic (congestive) heart failure: Secondary | ICD-10-CM | POA: Diagnosis not present

## 2023-10-26 DIAGNOSIS — I672 Cerebral atherosclerosis: Secondary | ICD-10-CM | POA: Diagnosis not present

## 2023-10-26 DIAGNOSIS — Z96651 Presence of right artificial knee joint: Secondary | ICD-10-CM | POA: Diagnosis not present

## 2023-10-26 DIAGNOSIS — M25551 Pain in right hip: Secondary | ICD-10-CM | POA: Diagnosis not present

## 2023-10-26 LAB — COMPREHENSIVE METABOLIC PANEL WITH GFR
ALT: 27 U/L (ref 0–44)
AST: 21 U/L (ref 15–41)
Albumin: 3 g/dL — ABNORMAL LOW (ref 3.5–5.0)
Alkaline Phosphatase: 104 U/L (ref 38–126)
Anion gap: 6 (ref 5–15)
BUN: 30 mg/dL — ABNORMAL HIGH (ref 8–23)
CO2: 28 mmol/L (ref 22–32)
Calcium: 9.2 mg/dL (ref 8.9–10.3)
Chloride: 102 mmol/L (ref 98–111)
Creatinine, Ser: 0.65 mg/dL (ref 0.44–1.00)
GFR, Estimated: >60 mL/min (ref >60)
Glucose, Bld: 174 mg/dL — ABNORMAL HIGH (ref 70–99)
Potassium: 3.8 mmol/L (ref 3.5–5.1)
Sodium: 136 mmol/L (ref 135–145)
Total Bilirubin: 0.5 mg/dL (ref 0.0–1.2)
Total Protein: 5.8 g/dL — ABNORMAL LOW (ref 6.5–8.1)

## 2023-10-26 LAB — CBC WITH DIFFERENTIAL/PLATELET
Abs Immature Granulocytes: 0.08 10*3/uL — ABNORMAL HIGH (ref 0.00–0.07)
Basophils Absolute: 0.1 10*3/uL (ref 0.0–0.1)
Basophils Relative: 1 %
Eosinophils Absolute: 0.2 10*3/uL (ref 0.0–0.5)
Eosinophils Relative: 2 %
HCT: 45.9 % (ref 36.0–46.0)
Hemoglobin: 15.6 g/dL — ABNORMAL HIGH (ref 12.0–15.0)
Immature Granulocytes: 1 %
Lymphocytes Relative: 20 %
Lymphs Abs: 1.9 10*3/uL (ref 0.7–4.0)
MCH: 33.8 pg (ref 26.0–34.0)
MCHC: 34 g/dL (ref 30.0–36.0)
MCV: 99.4 fL (ref 80.0–100.0)
Monocytes Absolute: 1 10*3/uL (ref 0.1–1.0)
Monocytes Relative: 10 %
Neutro Abs: 6.5 10*3/uL (ref 1.7–7.7)
Neutrophils Relative %: 66 %
Platelets: 244 10*3/uL (ref 150–400)
RBC: 4.62 MIL/uL (ref 3.87–5.11)
RDW: 14.5 % (ref 11.5–15.5)
WBC: 9.7 10*3/uL (ref 4.0–10.5)
nRBC: 0 % (ref 0.0–0.2)

## 2023-10-26 LAB — CK: Total CK: 36 U/L — ABNORMAL LOW (ref 38–234)

## 2023-10-26 NOTE — ED Provider Notes (Signed)
 [place] EMERGENCY DEPARTMENT AT [facility] Provider Note  CSN: [id number] Arrival date & time: 10/26/23 1537  Chief Complaint(s) Fall  HPI Virginia [name] is a 83 y.o. female with PMH CAD status post CABG, T2DM, HTN, HLD, dementia who presents emergency room for evaluation of an unwitnessed fall.  Found on the floor by rehab staff today.  Unknown downtime.  Reportedly with hip pain per EMS but this appears to have resolved in the ER today as she is not complaining of any hip pain here.    Past Medical History Past Medical History:  Diagnosis Date   Coronary artery disease    status post coronary artery bypass grafting x3 in 1999   GERD (gastroesophageal reflux disease)    Hyperlipidemia    Hypertension    Renal artery stenosis (HCC)    S/P CABG (coronary artery bypass graft) 06/18/1997   x3   Type 2 diabetes mellitus (HCC)    Patient Active Problem List   Diagnosis Date Noted   H/O three vessel coronary artery bypass 07/19/2023   Left displaced femoral neck fracture (HCC) 07/16/2023   Protein malnutrition (HCC) 01/18/2023   Acute on chronic congestive heart failure (HCC) 01/18/2023   Impaired mobility and ADLs 11/14/2022   Weakness due to acute stroke (HCC) 11/14/2022   Cerebrovascular accident (CVA) due to embolism of cerebral artery (HCC) 11/11/2022   Essential tremor 06/05/2022   Thrombophlebitis 08/21/2021   Symptomatic anemia 08/18/2021   H/O: GI bleed 08/18/2021   Hypokalemia 08/18/2021   Osteoarthritis 11/18/2019   Chronic atrial fibrillation (HCC) 12/10/2017   Moderate episode of recurrent major depressive disorder (HCC) 04/26/2016   Left renal artery stenosis (HCC) 11/10/2014   GERD (gastroesophageal reflux disease) 01/07/2014   Hypertension associated with diabetes (HCC) 01/07/2014   CAD (coronary artery disease) 04/23/2013   Primary hypertension 04/23/2013   Hyperlipidemia 04/23/2013   Type 2 diabetes mellitus (HCC) 04/23/2013   Home  Medication(s) Prior to Admission medications   Medication Sig Start Date End Date Taking? Authorizing Provider  acetaminophen  (TYLENOL ) 500 MG tablet Take 2 tablets (1,000 mg total) by mouth every 8 (eight) hours as needed. 07/19/23   Justina Oman, MD  apixaban  (ELIQUIS ) 5 MG TABS tablet Take 1 tablet (5 mg total) by mouth 2 (two) times daily. 12/18/22   Tommas Fragmin A, FNP  ascorbic acid (VITAMIN C) 500 MG tablet Take 500 mg by mouth daily.    [provider]  atorvastatin  (LIPITOR) 80 MG tablet TAKE 1 TABLET BY MOUTH DAILY 06/21/23   Tommas Fragmin A, FNP  calcium -vitamin D (OSCAL WITH D) 500-5 MG-MCG tablet Take 1 tablet by mouth.    [provider]  cetirizine  (ZYRTEC ) 10 MG tablet TAKE ONE (1) TABLET BY MOUTH EVERY DAY 03/04/23   Tommas Fragmin A, FNP  dapagliflozin  propanediol (FARXIGA ) 10 MG TABS tablet TAKE 1 TABLET BY MOUTH DAILY  BEFORE BREAKFAST 07/25/23   Hawks, Christy A, FNP  ferrous sulfate  325 (65 FE) MG tablet Take 2 tablets (650 mg total) by mouth at bedtime. 07/19/23   Justina Oman, MD  furosemide  (LASIX ) 20 MG tablet Take 1 tablet (20 mg total) by mouth daily. 06/03/23   Yevette Hem, FNP  lactose free nutrition (BOOST) LIQD Take 237 mLs by mouth daily.    [provider]  megestrol  (MEGACE ) 400 MG/10ML suspension Take 10 mLs (400 mg total) by mouth 2 (two) times daily. 07/20/23   Colin Dawley, MD  metoprolol  succinate (TOPROL -XL) 50 MG  24 hr tablet TAKE 1 AND 1/2 TABLETS BY MOUTH  DAILY TAKE WITH OR IMMEDIATELY  FOLLOWING A MEAL Patient taking differently: Take 75 mg by mouth daily. 05/29/23   Tommas Fragmin A, FNP  mirtazapine  (REMERON ) 7.5 MG tablet Take 7.5 mg by mouth at bedtime. 07/22/23   [provider]  pantoprazole  (PROTONIX ) 40 MG tablet Take 1 tablet (40 mg total) by mouth daily. 12/18/22   Tommas Fragmin A, FNP  PARoxetine  (PAXIL ) 10 MG tablet TAKE 1 TABLET BY MOUTH EVERY  MORNING 06/21/23   Tommas Fragmin A, FNP  polyethylene  glycol (MIRALAX  / GLYCOLAX ) 17 g packet Take 17 g by mouth daily as needed for mild constipation. 07/19/23   Justina Oman, MD  traMADol  (ULTRAM ) 50 MG tablet Take 1 tablet (50 mg total) by mouth every 12 (twelve) hours as needed for severe pain (pain score 7-10). 07/19/23   Justina Oman, MD                                                                                                                                    Past Surgical History Past Surgical History:  Procedure Laterality Date   BIOPSY  08/19/2021   Procedure: BIOPSY;  Surgeon: Umberto Ganong, Bearl Limes, MD;  Location: [facility];  Service: Gastroenterology;;   CARDIAC CATHETERIZATION  04/17/2007   L main 50-60% ostital stenosis, patent LAD, 90% stenosis at L Cfx, dominant RCA with 60-70% segmental stenosis, patent LIMA-LAD, patent free LIMA-OM, patent VG-PDA (Dr. Aleda Ammon)   CARDIAC CATHETERIZATION  03/02/2003   L main with 50% osital stenosis, LAD totally occluded after 2nd diagonal, L Cfx with 90% ostial OM, RCA with 70-80% segmental stenosis, patent LIMA-LAD, patent free RIMA-Cfx marginal, patent VG to PDA (Dr. Aleda Ammon)   CARDIAC CATHETERIZATION  05/18/1998   significant ostial Cfx & OM & mid LAD disease - subsequent CABG (Dr. Aleda Ammon)   CAROTID DOPPLER  03/2006   right bulb with 0-49% diameter reduction   CORONARY ARTERY BYPASS GRAFT  05/19/1998   LIMA to LAD, free RIMA to OM, VG to distal RCA   ESOPHAGOGASTRODUODENOSCOPY (EGD) WITH PROPOFOL  N/A 08/19/2021   Procedure: ESOPHAGOGASTRODUODENOSCOPY (EGD) WITH PROPOFOL ;  Surgeon: Urban Garden, MD;  Location: [facility];  Service: Gastroenterology;  Laterality: N/A;   ESOPHAGOGASTRODUODENOSCOPY (EGD) WITH PROPOFOL  N/A 10/17/2021   Procedure: ESOPHAGOGASTRODUODENOSCOPY (EGD) WITH PROPOFOL ;  Surgeon: Urban Garden, MD;  Location: [facility];  Service: Gastroenterology;  Laterality: N/A;  730   HIP ARTHROPLASTY Left 07/17/2023   Procedure: ARTHROPLASTY  BIPOLAR HIP (HEMIARTHROPLASTY);  Surgeon: Darrin Emerald, MD;  Location: [facility];  Service: Orthopedics;  Laterality: Left;   NM MYOCAR PERF WALL MOTION  04/2011   lexiscan  - fixed paical and basal-mid lateral defect, no reversible ischemia; EF 58%; PVCs noted; low risk   RENAL DOPPLER  09/2012   right renal artery 60-99% diameter reduction, left renal artery  1-59% diameter reduction   TOTAL KNEE ARTHROPLASTY Right    Dr. Iline Mallory   Family History Family History  Problem Relation Age of Onset   Stroke Mother    Heart attack Son     Social History Social History   Tobacco Use   Smoking status: Never   Smokeless tobacco: Never  Vaping Use   Vaping status: Never Used  Substance Use Topics   Alcohol use: No   Drug use: No   Allergies Patient has no known allergies.  Review of Systems Review of Systems  All other systems reviewed and are negative.   Physical Exam Vital Signs  I have reviewed the triage vital signs BP 139/76 (BP Location: Left Arm)   Pulse 67   Temp 98.1 F (36.7 C) (Oral)   Resp 14   Ht 5\' 5"  (1.651 m)   Wt 68 kg   SpO2 96%   BMI 24.96 kg/m   Physical Exam Vitals and nursing note reviewed.  Constitutional:      General: She is not in acute distress.    Appearance: She is well-developed.  HENT:     Head: Normocephalic and atraumatic.  Eyes:     Conjunctiva/sclera: Conjunctivae normal.  Cardiovascular:     Rate and Rhythm: Normal rate and regular rhythm.     Heart sounds: No murmur heard. Pulmonary:     Effort: Pulmonary effort is normal. No respiratory distress.     Breath sounds: Normal breath sounds.  Abdominal:     Palpations: Abdomen is soft.     Tenderness: There is no abdominal tenderness.  Musculoskeletal:        General: No swelling.     Cervical back: Neck supple.  Skin:    General: Skin is warm and dry.     Capillary Refill: Capillary refill takes less than 2 seconds.  Neurological:     Mental Status: She is alert.   Psychiatric:        Mood and Affect: Mood normal.     ED Results and Treatments Labs (all labs ordered are listed, but only abnormal results are displayed) Labs Reviewed  COMPREHENSIVE METABOLIC PANEL WITH GFR - Abnormal; Notable for the following components:      Result Value   Glucose, Bld 174 (*)    BUN 30 (*)    Total Protein 5.8 (*)    Albumin 3.0 (*)    All other components within normal limits  CBC WITH DIFFERENTIAL/PLATELET - Abnormal; Notable for the following components:   Hemoglobin 15.6 (*)    Abs Immature Granulocytes 0.08 (*)    All other components within normal limits  CK - Abnormal; Notable for the following components:   Total CK 36 (*)    All other components within normal limits                                                                                                                          Radiology  DG Chest Portable 1 View Result Date: 10/26/2023 CLINICAL DATA:  Fall. EXAM: PORTABLE CHEST 1 VIEW COMPARISON:  Radiograph 07/15/2023 FINDINGS: Prior median sternotomy. Stable heart size and mediastinal contours. No pneumothorax, large pleural effusion or focal airspace disease. Chronic right shoulder arthropathy. Left rib fractures appear chronic. IMPRESSION: 1. No acute chest findings. 2. Chronic left rib fractures. Electronically Signed   By: Chadwick Colonel M.D.   On: 10/26/2023 18:33   DG Pelvis Portable Result Date: 10/26/2023 CLINICAL DATA:  Unwitnessed fall. EXAM: PORTABLE PELVIS 1-2 VIEWS COMPARISON:  Pelvis radiograph 07/17/2023 FINDINGS: Irregularity about the right femoral neck is unchanged from prior exam. This may be due to positioning. Left hip arthroplasty is intact. No evidence of acute pelvic fracture. The bones are subjectively under mineralized which limits assessment. IMPRESSION: 1. Left hip arthroplasty without complication 2. Irregularity about the right femoral neck is unchanged from prior exam. This may represent artifact related to  positioning versus nondisplaced fracture. As clinically indicated, consider CT. Electronically Signed   By: Chadwick Colonel M.D.   On: 10/26/2023 18:32   CT CERVICAL SPINE WO CONTRAST Result Date: 10/26/2023 CLINICAL DATA:  Unwitnessed fall. EXAM: CT CERVICAL SPINE WITHOUT CONTRAST TECHNIQUE: Multidetector CT imaging of the cervical spine was performed without intravenous contrast. Multiplanar CT image reconstructions were also generated. RADIATION DOSE REDUCTION: This exam was performed according to the departmental dose-optimization program which includes automated exposure control, adjustment of the mA and/or kV according to patient size and/or use of iterative reconstruction technique. COMPARISON:  None Available. FINDINGS: Alignment: No traumatic subluxation. Grade 1 anterolisthesis of C3 on C4, C4 on C5, and C7 on T1 is likely degenerative and facet mediated. Skull base and vertebrae: No acute fracture. Non fusion posterior arch of C1, chronic. The dens and skull base are intact. Soft tissues and spinal canal: No prevertebral fluid or swelling. No visible canal hematoma. Disc levels: Moderate degenerative disc disease from C4-C5 through C6-C7. Moderate multilevel facet hypertrophy. Upper chest: No acute findings. Other: Prominent carotid calcifications. IMPRESSION: 1. No acute fracture or subluxation of the cervical spine. 2. Moderate multilevel degenerative disc disease and facet hypertrophy. Electronically Signed   By: Chadwick Colonel M.D.   On: 10/26/2023 18:30   CT HEAD WO CONTRAST ( ) Result Date: 10/26/2023 CLINICAL DATA:  Unwitnessed fall. EXAM: CT HEAD WITHOUT CONTRAST TECHNIQUE: Contiguous axial images were obtained from the base of the skull through the vertex without intravenous contrast. RADIATION DOSE REDUCTION: This exam was performed according to the departmental dose-optimization program which includes automated exposure control, adjustment of the mA and/or kV according to patient  size and/or use of iterative reconstruction technique. COMPARISON:  Head CT 03/06/2019 FINDINGS: Brain: No intracranial hemorrhage, mass effect, or midline shift. Generalized atrophy with slight temporal lobe predominance. No hydrocephalus. Moderate to advanced periventricular and deep white matter hypodensity typical of chronic small vessel ischemia. No evidence of territorial infarct or acute ischemia. No extra-axial or intracranial fluid collection. Vascular: Atherosclerosis of skullbase vasculature without hyperdense vessel or abnormal calcification. Skull: No fracture or focal lesion. Sinuses/Orbits: No acute finding.  Bilateral cataract resection. Other: No confluent scalp hematoma. IMPRESSION: 1. No acute intracranial abnormality. No skull fracture. 2. Atrophy and chronic small vessel ischemia. Electronically Signed   By: Chadwick Colonel M.D.   On: 10/26/2023 18:27    Pertinent labs & imaging results that were available during my care of the patient were reviewed by me and considered in my medical decision making (see MDM for details).  Medications  Ordered in ED Medications - No data to display                                                                                                                                   Procedures Procedures  (including critical care time)  Medical Decision Making / ED Course   This patient presents to the ED for concern of fall, this involves an extensive number of treatment options, and is a complaint that carries with it a high risk of complications and morbidity.  The differential diagnosis includes fracture, contusion, hematoma, ligamentous injury, closed head injury, ICH, laceration, intrathoracic injury, intra-abdominal injury  MDM: Patient seen emerged part for evaluation of a fall.  Physical exam largely unremarkable with no external evidence of trauma.  Laboratory evaluation with an albumin of 3.0 but is otherwise unremarkable.  CK is normal.   Trauma imaging including CT head and C-spine, chest x-ray unremarkable.  Pelvis x-ray with a right femur deformity that has been seen previously.  Patient is nonweightbearing and is wheelchair-bound at baseline and as this would be nonoperative regardless and appears to be chronic, we will defer advanced imaging of the femur today.  On reevaluation she is not experiencing any pain on palpation in this area.  At this time with negative trauma workup she does not meet inpatient criteria for admission and will be discharged with outpatient follow-up.   Additional history obtained:  -External records from outside source obtained and reviewed including: Chart review including previous notes, labs, imaging, consultation notes   Lab Tests: -I ordered, reviewed, and interpreted labs.   The pertinent results include:   Labs Reviewed  COMPREHENSIVE METABOLIC PANEL WITH GFR - Abnormal; Notable for the following components:      Result Value   Glucose, Bld 174 (*)    BUN 30 (*)    Total Protein 5.8 (*)    Albumin 3.0 (*)    All other components within normal limits  CBC WITH DIFFERENTIAL/PLATELET - Abnormal; Notable for the following components:   Hemoglobin 15.6 (*)    Abs Immature Granulocytes 0.08 (*)    All other components within normal limits  CK - Abnormal; Notable for the following components:   Total CK 36 (*)    All other components within normal limits       Imaging Studies ordered: I ordered imaging studies including CT head, CT C-spine, chest x-ray, pelvis x-ray I independently visualized and interpreted imaging. I agree with the radiologist interpretation   Medicines ordered and prescription drug management: No orders of the defined types were placed in this encounter.   -I have reviewed the patients home medicines and have made adjustments as needed  Critical interventions none    Cardiac Monitoring: The patient was maintained on a cardiac monitor.  I personally  viewed and interpreted the cardiac monitored which showed an underlying rhythm of: NSR  Social Determinants of Health:  Factors impacting patients care include: Lives in skilled nursing facility   Reevaluation: After the interventions noted above, I reevaluated the patient and found that they have :stayed the same  Co morbidities that complicate the patient evaluation  Past Medical History:  Diagnosis Date   Coronary artery disease    status post coronary artery bypass grafting x3 in 1999   GERD (gastroesophageal reflux disease)    Hyperlipidemia    Hypertension    Renal artery stenosis (HCC)    S/P CABG (coronary artery bypass graft) 06/18/1997   x3   Type 2 diabetes mellitus (HCC)       Dispostion: I considered admission for this patient, but at this time she does not meet inpatient criteria for admission will be discharged with outpatient follow-up     Final Clinical Impression(s) / ED Diagnoses Final diagnoses:  Fall, initial encounter     @PCDICTATION @    Karlyn Overman, MD 10/26/23 2120

## 2023-10-26 NOTE — ED Notes (Signed)
 Pt lying in bed asleep. VS WNL at this time.

## 2023-10-26 NOTE — ED Triage Notes (Signed)
 Pr via EMS from [facility] after unwitnessed fall. Staff found pt on the floor, unsure of downtime or MOI. Pt reported right hip pain on scene but now denies pain. Hx DMII, dementia, left hip fx prior. No pelvic instability per EMS. Pt alert and oriented to self, disoriented to time. No thinners per EMS. No head injury or LOC.   BP 109/68 HR 71 O2 98% RA CBG 202

## 2023-10-26 NOTE — ED Notes (Signed)
 [facility] was called. No answer.

## 2023-11-07 DIAGNOSIS — E1169 Type 2 diabetes mellitus with other specified complication: Secondary | ICD-10-CM | POA: Diagnosis not present

## 2023-11-07 DIAGNOSIS — I10 Essential (primary) hypertension: Secondary | ICD-10-CM | POA: Diagnosis not present

## 2023-11-07 DIAGNOSIS — S72042A Displaced fracture of base of neck of left femur, initial encounter for closed fracture: Secondary | ICD-10-CM | POA: Diagnosis not present

## 2023-11-07 DIAGNOSIS — I480 Paroxysmal atrial fibrillation: Secondary | ICD-10-CM | POA: Diagnosis not present

## 2023-11-07 DIAGNOSIS — R531 Weakness: Secondary | ICD-10-CM | POA: Diagnosis not present

## 2023-11-07 DIAGNOSIS — K219 Gastro-esophageal reflux disease without esophagitis: Secondary | ICD-10-CM | POA: Diagnosis not present

## 2023-11-14 DIAGNOSIS — R1310 Dysphagia, unspecified: Secondary | ICD-10-CM | POA: Diagnosis not present

## 2023-11-19 DIAGNOSIS — R1310 Dysphagia, unspecified: Secondary | ICD-10-CM | POA: Diagnosis not present

## 2023-11-19 DIAGNOSIS — M62562 Muscle wasting and atrophy, not elsewhere classified, left lower leg: Secondary | ICD-10-CM | POA: Diagnosis not present

## 2023-11-19 DIAGNOSIS — R2689 Other abnormalities of gait and mobility: Secondary | ICD-10-CM | POA: Diagnosis not present

## 2023-11-19 DIAGNOSIS — M62561 Muscle wasting and atrophy, not elsewhere classified, right lower leg: Secondary | ICD-10-CM | POA: Diagnosis not present

## 2023-11-25 DIAGNOSIS — M62562 Muscle wasting and atrophy, not elsewhere classified, left lower leg: Secondary | ICD-10-CM | POA: Diagnosis not present

## 2023-11-25 DIAGNOSIS — M62561 Muscle wasting and atrophy, not elsewhere classified, right lower leg: Secondary | ICD-10-CM | POA: Diagnosis not present

## 2023-11-25 DIAGNOSIS — R2689 Other abnormalities of gait and mobility: Secondary | ICD-10-CM | POA: Diagnosis not present

## 2023-11-25 DIAGNOSIS — R1310 Dysphagia, unspecified: Secondary | ICD-10-CM | POA: Diagnosis not present

## 2023-11-26 DIAGNOSIS — R1310 Dysphagia, unspecified: Secondary | ICD-10-CM | POA: Diagnosis not present

## 2023-11-26 DIAGNOSIS — M62562 Muscle wasting and atrophy, not elsewhere classified, left lower leg: Secondary | ICD-10-CM | POA: Diagnosis not present

## 2023-11-26 DIAGNOSIS — R2689 Other abnormalities of gait and mobility: Secondary | ICD-10-CM | POA: Diagnosis not present

## 2023-11-26 DIAGNOSIS — M62561 Muscle wasting and atrophy, not elsewhere classified, right lower leg: Secondary | ICD-10-CM | POA: Diagnosis not present

## 2023-11-27 DIAGNOSIS — M62561 Muscle wasting and atrophy, not elsewhere classified, right lower leg: Secondary | ICD-10-CM | POA: Diagnosis not present

## 2023-11-27 DIAGNOSIS — M62562 Muscle wasting and atrophy, not elsewhere classified, left lower leg: Secondary | ICD-10-CM | POA: Diagnosis not present

## 2023-11-27 DIAGNOSIS — R2689 Other abnormalities of gait and mobility: Secondary | ICD-10-CM | POA: Diagnosis not present

## 2023-11-27 DIAGNOSIS — L602 Onychogryphosis: Secondary | ICD-10-CM | POA: Diagnosis not present

## 2023-11-27 DIAGNOSIS — R1310 Dysphagia, unspecified: Secondary | ICD-10-CM | POA: Diagnosis not present

## 2023-11-27 DIAGNOSIS — I739 Peripheral vascular disease, unspecified: Secondary | ICD-10-CM | POA: Diagnosis not present

## 2023-11-28 DIAGNOSIS — M62561 Muscle wasting and atrophy, not elsewhere classified, right lower leg: Secondary | ICD-10-CM | POA: Diagnosis not present

## 2023-11-28 DIAGNOSIS — R1310 Dysphagia, unspecified: Secondary | ICD-10-CM | POA: Diagnosis not present

## 2023-11-28 DIAGNOSIS — M62562 Muscle wasting and atrophy, not elsewhere classified, left lower leg: Secondary | ICD-10-CM | POA: Diagnosis not present

## 2023-11-28 DIAGNOSIS — R2689 Other abnormalities of gait and mobility: Secondary | ICD-10-CM | POA: Diagnosis not present

## 2023-11-29 DIAGNOSIS — M62562 Muscle wasting and atrophy, not elsewhere classified, left lower leg: Secondary | ICD-10-CM | POA: Diagnosis not present

## 2023-11-29 DIAGNOSIS — M62561 Muscle wasting and atrophy, not elsewhere classified, right lower leg: Secondary | ICD-10-CM | POA: Diagnosis not present

## 2023-11-29 DIAGNOSIS — R2689 Other abnormalities of gait and mobility: Secondary | ICD-10-CM | POA: Diagnosis not present

## 2023-11-29 DIAGNOSIS — R1310 Dysphagia, unspecified: Secondary | ICD-10-CM | POA: Diagnosis not present

## 2023-11-30 DIAGNOSIS — M62562 Muscle wasting and atrophy, not elsewhere classified, left lower leg: Secondary | ICD-10-CM | POA: Diagnosis not present

## 2023-11-30 DIAGNOSIS — R1310 Dysphagia, unspecified: Secondary | ICD-10-CM | POA: Diagnosis not present

## 2023-11-30 DIAGNOSIS — R2689 Other abnormalities of gait and mobility: Secondary | ICD-10-CM | POA: Diagnosis not present

## 2023-11-30 DIAGNOSIS — M62561 Muscle wasting and atrophy, not elsewhere classified, right lower leg: Secondary | ICD-10-CM | POA: Diagnosis not present

## 2023-12-03 DIAGNOSIS — M62562 Muscle wasting and atrophy, not elsewhere classified, left lower leg: Secondary | ICD-10-CM | POA: Diagnosis not present

## 2023-12-03 DIAGNOSIS — M62561 Muscle wasting and atrophy, not elsewhere classified, right lower leg: Secondary | ICD-10-CM | POA: Diagnosis not present

## 2023-12-03 DIAGNOSIS — R2689 Other abnormalities of gait and mobility: Secondary | ICD-10-CM | POA: Diagnosis not present

## 2023-12-03 DIAGNOSIS — R1310 Dysphagia, unspecified: Secondary | ICD-10-CM | POA: Diagnosis not present

## 2023-12-04 DIAGNOSIS — M62562 Muscle wasting and atrophy, not elsewhere classified, left lower leg: Secondary | ICD-10-CM | POA: Diagnosis not present

## 2023-12-04 DIAGNOSIS — R1310 Dysphagia, unspecified: Secondary | ICD-10-CM | POA: Diagnosis not present

## 2023-12-04 DIAGNOSIS — M62561 Muscle wasting and atrophy, not elsewhere classified, right lower leg: Secondary | ICD-10-CM | POA: Diagnosis not present

## 2023-12-04 DIAGNOSIS — R2689 Other abnormalities of gait and mobility: Secondary | ICD-10-CM | POA: Diagnosis not present

## 2023-12-05 DIAGNOSIS — M62562 Muscle wasting and atrophy, not elsewhere classified, left lower leg: Secondary | ICD-10-CM | POA: Diagnosis not present

## 2023-12-05 DIAGNOSIS — M62561 Muscle wasting and atrophy, not elsewhere classified, right lower leg: Secondary | ICD-10-CM | POA: Diagnosis not present

## 2023-12-05 DIAGNOSIS — R1310 Dysphagia, unspecified: Secondary | ICD-10-CM | POA: Diagnosis not present

## 2023-12-05 DIAGNOSIS — R2689 Other abnormalities of gait and mobility: Secondary | ICD-10-CM | POA: Diagnosis not present

## 2023-12-07 DIAGNOSIS — R531 Weakness: Secondary | ICD-10-CM | POA: Diagnosis not present

## 2023-12-07 DIAGNOSIS — S72042A Displaced fracture of base of neck of left femur, initial encounter for closed fracture: Secondary | ICD-10-CM | POA: Diagnosis not present

## 2023-12-07 DIAGNOSIS — E1169 Type 2 diabetes mellitus with other specified complication: Secondary | ICD-10-CM | POA: Diagnosis not present

## 2023-12-07 DIAGNOSIS — I480 Paroxysmal atrial fibrillation: Secondary | ICD-10-CM | POA: Diagnosis not present

## 2023-12-07 DIAGNOSIS — K219 Gastro-esophageal reflux disease without esophagitis: Secondary | ICD-10-CM | POA: Diagnosis not present

## 2023-12-07 DIAGNOSIS — I10 Essential (primary) hypertension: Secondary | ICD-10-CM | POA: Diagnosis not present

## 2023-12-09 DIAGNOSIS — R2689 Other abnormalities of gait and mobility: Secondary | ICD-10-CM | POA: Diagnosis not present

## 2023-12-09 DIAGNOSIS — M62562 Muscle wasting and atrophy, not elsewhere classified, left lower leg: Secondary | ICD-10-CM | POA: Diagnosis not present

## 2023-12-09 DIAGNOSIS — R1310 Dysphagia, unspecified: Secondary | ICD-10-CM | POA: Diagnosis not present

## 2023-12-09 DIAGNOSIS — M62561 Muscle wasting and atrophy, not elsewhere classified, right lower leg: Secondary | ICD-10-CM | POA: Diagnosis not present

## 2023-12-10 DIAGNOSIS — R1310 Dysphagia, unspecified: Secondary | ICD-10-CM | POA: Diagnosis not present

## 2023-12-10 DIAGNOSIS — R2689 Other abnormalities of gait and mobility: Secondary | ICD-10-CM | POA: Diagnosis not present

## 2023-12-10 DIAGNOSIS — M62561 Muscle wasting and atrophy, not elsewhere classified, right lower leg: Secondary | ICD-10-CM | POA: Diagnosis not present

## 2023-12-10 DIAGNOSIS — M62562 Muscle wasting and atrophy, not elsewhere classified, left lower leg: Secondary | ICD-10-CM | POA: Diagnosis not present

## 2023-12-11 DIAGNOSIS — R2689 Other abnormalities of gait and mobility: Secondary | ICD-10-CM | POA: Diagnosis not present

## 2023-12-11 DIAGNOSIS — R1310 Dysphagia, unspecified: Secondary | ICD-10-CM | POA: Diagnosis not present

## 2023-12-11 DIAGNOSIS — M62562 Muscle wasting and atrophy, not elsewhere classified, left lower leg: Secondary | ICD-10-CM | POA: Diagnosis not present

## 2023-12-11 DIAGNOSIS — M62561 Muscle wasting and atrophy, not elsewhere classified, right lower leg: Secondary | ICD-10-CM | POA: Diagnosis not present

## 2023-12-12 DIAGNOSIS — R2689 Other abnormalities of gait and mobility: Secondary | ICD-10-CM | POA: Diagnosis not present

## 2023-12-12 DIAGNOSIS — M62562 Muscle wasting and atrophy, not elsewhere classified, left lower leg: Secondary | ICD-10-CM | POA: Diagnosis not present

## 2023-12-12 DIAGNOSIS — R1310 Dysphagia, unspecified: Secondary | ICD-10-CM | POA: Diagnosis not present

## 2023-12-12 DIAGNOSIS — M62561 Muscle wasting and atrophy, not elsewhere classified, right lower leg: Secondary | ICD-10-CM | POA: Diagnosis not present

## 2023-12-13 DIAGNOSIS — R1310 Dysphagia, unspecified: Secondary | ICD-10-CM | POA: Diagnosis not present

## 2023-12-13 DIAGNOSIS — M62562 Muscle wasting and atrophy, not elsewhere classified, left lower leg: Secondary | ICD-10-CM | POA: Diagnosis not present

## 2023-12-13 DIAGNOSIS — M62561 Muscle wasting and atrophy, not elsewhere classified, right lower leg: Secondary | ICD-10-CM | POA: Diagnosis not present

## 2023-12-13 DIAGNOSIS — R2689 Other abnormalities of gait and mobility: Secondary | ICD-10-CM | POA: Diagnosis not present

## 2023-12-17 DIAGNOSIS — R2689 Other abnormalities of gait and mobility: Secondary | ICD-10-CM | POA: Diagnosis not present

## 2023-12-17 DIAGNOSIS — M62562 Muscle wasting and atrophy, not elsewhere classified, left lower leg: Secondary | ICD-10-CM | POA: Diagnosis not present

## 2023-12-17 DIAGNOSIS — M62561 Muscle wasting and atrophy, not elsewhere classified, right lower leg: Secondary | ICD-10-CM | POA: Diagnosis not present

## 2023-12-19 DIAGNOSIS — M62561 Muscle wasting and atrophy, not elsewhere classified, right lower leg: Secondary | ICD-10-CM | POA: Diagnosis not present

## 2023-12-19 DIAGNOSIS — M62562 Muscle wasting and atrophy, not elsewhere classified, left lower leg: Secondary | ICD-10-CM | POA: Diagnosis not present

## 2023-12-19 DIAGNOSIS — R2689 Other abnormalities of gait and mobility: Secondary | ICD-10-CM | POA: Diagnosis not present

## 2023-12-20 DIAGNOSIS — M62562 Muscle wasting and atrophy, not elsewhere classified, left lower leg: Secondary | ICD-10-CM | POA: Diagnosis not present

## 2023-12-20 DIAGNOSIS — R2689 Other abnormalities of gait and mobility: Secondary | ICD-10-CM | POA: Diagnosis not present

## 2023-12-20 DIAGNOSIS — M62561 Muscle wasting and atrophy, not elsewhere classified, right lower leg: Secondary | ICD-10-CM | POA: Diagnosis not present

## 2023-12-23 DIAGNOSIS — M62562 Muscle wasting and atrophy, not elsewhere classified, left lower leg: Secondary | ICD-10-CM | POA: Diagnosis not present

## 2023-12-23 DIAGNOSIS — M62561 Muscle wasting and atrophy, not elsewhere classified, right lower leg: Secondary | ICD-10-CM | POA: Diagnosis not present

## 2023-12-23 DIAGNOSIS — R2689 Other abnormalities of gait and mobility: Secondary | ICD-10-CM | POA: Diagnosis not present

## 2023-12-24 DIAGNOSIS — M62561 Muscle wasting and atrophy, not elsewhere classified, right lower leg: Secondary | ICD-10-CM | POA: Diagnosis not present

## 2023-12-24 DIAGNOSIS — R2689 Other abnormalities of gait and mobility: Secondary | ICD-10-CM | POA: Diagnosis not present

## 2023-12-24 DIAGNOSIS — M62562 Muscle wasting and atrophy, not elsewhere classified, left lower leg: Secondary | ICD-10-CM | POA: Diagnosis not present

## 2023-12-25 DIAGNOSIS — M62561 Muscle wasting and atrophy, not elsewhere classified, right lower leg: Secondary | ICD-10-CM | POA: Diagnosis not present

## 2023-12-25 DIAGNOSIS — R2689 Other abnormalities of gait and mobility: Secondary | ICD-10-CM | POA: Diagnosis not present

## 2023-12-25 DIAGNOSIS — M62562 Muscle wasting and atrophy, not elsewhere classified, left lower leg: Secondary | ICD-10-CM | POA: Diagnosis not present

## 2023-12-26 DIAGNOSIS — M62561 Muscle wasting and atrophy, not elsewhere classified, right lower leg: Secondary | ICD-10-CM | POA: Diagnosis not present

## 2023-12-26 DIAGNOSIS — R2689 Other abnormalities of gait and mobility: Secondary | ICD-10-CM | POA: Diagnosis not present

## 2023-12-26 DIAGNOSIS — M62562 Muscle wasting and atrophy, not elsewhere classified, left lower leg: Secondary | ICD-10-CM | POA: Diagnosis not present

## 2023-12-27 DIAGNOSIS — M62561 Muscle wasting and atrophy, not elsewhere classified, right lower leg: Secondary | ICD-10-CM | POA: Diagnosis not present

## 2023-12-27 DIAGNOSIS — M62562 Muscle wasting and atrophy, not elsewhere classified, left lower leg: Secondary | ICD-10-CM | POA: Diagnosis not present

## 2023-12-27 DIAGNOSIS — R2689 Other abnormalities of gait and mobility: Secondary | ICD-10-CM | POA: Diagnosis not present

## 2024-01-08 DIAGNOSIS — K219 Gastro-esophageal reflux disease without esophagitis: Secondary | ICD-10-CM | POA: Diagnosis not present

## 2024-01-08 DIAGNOSIS — I10 Essential (primary) hypertension: Secondary | ICD-10-CM | POA: Diagnosis not present

## 2024-01-08 DIAGNOSIS — I480 Paroxysmal atrial fibrillation: Secondary | ICD-10-CM | POA: Diagnosis not present

## 2024-01-08 DIAGNOSIS — E1169 Type 2 diabetes mellitus with other specified complication: Secondary | ICD-10-CM | POA: Diagnosis not present

## 2024-01-08 DIAGNOSIS — R531 Weakness: Secondary | ICD-10-CM | POA: Diagnosis not present

## 2024-01-08 DIAGNOSIS — S72042A Displaced fracture of base of neck of left femur, initial encounter for closed fracture: Secondary | ICD-10-CM | POA: Diagnosis not present

## 2024-01-17 DIAGNOSIS — I482 Chronic atrial fibrillation, unspecified: Secondary | ICD-10-CM | POA: Diagnosis not present

## 2024-01-17 DIAGNOSIS — I5020 Unspecified systolic (congestive) heart failure: Secondary | ICD-10-CM | POA: Diagnosis not present

## 2024-02-09 DIAGNOSIS — K219 Gastro-esophageal reflux disease without esophagitis: Secondary | ICD-10-CM | POA: Diagnosis not present

## 2024-02-09 DIAGNOSIS — R531 Weakness: Secondary | ICD-10-CM | POA: Diagnosis not present

## 2024-02-09 DIAGNOSIS — S72042A Displaced fracture of base of neck of left femur, initial encounter for closed fracture: Secondary | ICD-10-CM | POA: Diagnosis not present

## 2024-02-09 DIAGNOSIS — E1169 Type 2 diabetes mellitus with other specified complication: Secondary | ICD-10-CM | POA: Diagnosis not present

## 2024-02-09 DIAGNOSIS — I10 Essential (primary) hypertension: Secondary | ICD-10-CM | POA: Diagnosis not present

## 2024-02-09 DIAGNOSIS — I480 Paroxysmal atrial fibrillation: Secondary | ICD-10-CM | POA: Diagnosis not present

## 2024-02-27 DIAGNOSIS — Z23 Encounter for immunization: Secondary | ICD-10-CM | POA: Diagnosis not present

## 2024-03-06 DIAGNOSIS — L602 Onychogryphosis: Secondary | ICD-10-CM | POA: Diagnosis not present

## 2024-03-06 DIAGNOSIS — I739 Peripheral vascular disease, unspecified: Secondary | ICD-10-CM | POA: Diagnosis not present

## 2024-03-06 DIAGNOSIS — L603 Nail dystrophy: Secondary | ICD-10-CM | POA: Diagnosis not present

## 2024-03-08 DIAGNOSIS — E1169 Type 2 diabetes mellitus with other specified complication: Secondary | ICD-10-CM | POA: Diagnosis not present

## 2024-03-08 DIAGNOSIS — I10 Essential (primary) hypertension: Secondary | ICD-10-CM | POA: Diagnosis not present

## 2024-03-08 DIAGNOSIS — R531 Weakness: Secondary | ICD-10-CM | POA: Diagnosis not present

## 2024-03-08 DIAGNOSIS — S72042A Displaced fracture of base of neck of left femur, initial encounter for closed fracture: Secondary | ICD-10-CM | POA: Diagnosis not present

## 2024-03-08 DIAGNOSIS — K219 Gastro-esophageal reflux disease without esophagitis: Secondary | ICD-10-CM | POA: Diagnosis not present

## 2024-03-08 DIAGNOSIS — I480 Paroxysmal atrial fibrillation: Secondary | ICD-10-CM | POA: Diagnosis not present

## 2024-03-23 DIAGNOSIS — M549 Dorsalgia, unspecified: Secondary | ICD-10-CM | POA: Diagnosis not present

## 2024-03-26 DIAGNOSIS — M546 Pain in thoracic spine: Secondary | ICD-10-CM | POA: Diagnosis not present

## 2024-03-26 DIAGNOSIS — M545 Low back pain, unspecified: Secondary | ICD-10-CM | POA: Diagnosis not present

## 2024-03-30 DIAGNOSIS — G9389 Other specified disorders of brain: Secondary | ICD-10-CM | POA: Diagnosis not present

## 2024-03-30 DIAGNOSIS — I4819 Other persistent atrial fibrillation: Secondary | ICD-10-CM | POA: Diagnosis not present

## 2024-03-30 DIAGNOSIS — Z711 Person with feared health complaint in whom no diagnosis is made: Secondary | ICD-10-CM | POA: Diagnosis not present

## 2024-03-30 DIAGNOSIS — Z7984 Long term (current) use of oral hypoglycemic drugs: Secondary | ICD-10-CM | POA: Diagnosis not present

## 2024-03-30 DIAGNOSIS — Z7902 Long term (current) use of antithrombotics/antiplatelets: Secondary | ICD-10-CM | POA: Diagnosis not present

## 2024-03-30 DIAGNOSIS — M549 Dorsalgia, unspecified: Secondary | ICD-10-CM | POA: Diagnosis not present

## 2024-03-30 DIAGNOSIS — I482 Chronic atrial fibrillation, unspecified: Secondary | ICD-10-CM | POA: Diagnosis not present

## 2024-03-30 DIAGNOSIS — N390 Urinary tract infection, site not specified: Secondary | ICD-10-CM | POA: Diagnosis not present

## 2024-03-30 DIAGNOSIS — I10 Essential (primary) hypertension: Secondary | ICD-10-CM | POA: Diagnosis not present

## 2024-03-30 DIAGNOSIS — E7849 Other hyperlipidemia: Secondary | ICD-10-CM | POA: Diagnosis not present

## 2024-03-30 DIAGNOSIS — I2510 Atherosclerotic heart disease of native coronary artery without angina pectoris: Secondary | ICD-10-CM | POA: Diagnosis not present

## 2024-03-30 DIAGNOSIS — I6523 Occlusion and stenosis of bilateral carotid arteries: Secondary | ICD-10-CM | POA: Diagnosis not present

## 2024-03-30 DIAGNOSIS — K219 Gastro-esophageal reflux disease without esophagitis: Secondary | ICD-10-CM | POA: Diagnosis not present

## 2024-03-30 DIAGNOSIS — E1143 Type 2 diabetes mellitus with diabetic autonomic (poly)neuropathy: Secondary | ICD-10-CM | POA: Diagnosis not present

## 2024-03-30 DIAGNOSIS — E119 Type 2 diabetes mellitus without complications: Secondary | ICD-10-CM | POA: Diagnosis not present

## 2024-03-30 DIAGNOSIS — G9341 Metabolic encephalopathy: Secondary | ICD-10-CM | POA: Diagnosis not present

## 2024-03-30 DIAGNOSIS — I5022 Chronic systolic (congestive) heart failure: Secondary | ICD-10-CM | POA: Diagnosis not present

## 2024-03-30 DIAGNOSIS — Z7901 Long term (current) use of anticoagulants: Secondary | ICD-10-CM | POA: Diagnosis not present

## 2024-03-30 DIAGNOSIS — R531 Weakness: Secondary | ICD-10-CM | POA: Diagnosis not present

## 2024-03-30 DIAGNOSIS — Z79899 Other long term (current) drug therapy: Secondary | ICD-10-CM | POA: Diagnosis not present

## 2024-03-30 DIAGNOSIS — I5020 Unspecified systolic (congestive) heart failure: Secondary | ICD-10-CM | POA: Diagnosis not present

## 2024-03-30 DIAGNOSIS — I110 Hypertensive heart disease with heart failure: Secondary | ICD-10-CM | POA: Diagnosis not present

## 2024-03-30 DIAGNOSIS — Z792 Long term (current) use of antibiotics: Secondary | ICD-10-CM | POA: Diagnosis not present

## 2024-04-01 DIAGNOSIS — G9341 Metabolic encephalopathy: Secondary | ICD-10-CM | POA: Diagnosis not present

## 2024-04-01 DIAGNOSIS — I2510 Atherosclerotic heart disease of native coronary artery without angina pectoris: Secondary | ICD-10-CM | POA: Diagnosis not present

## 2024-04-01 DIAGNOSIS — I4819 Other persistent atrial fibrillation: Secondary | ICD-10-CM | POA: Diagnosis not present

## 2024-04-01 DIAGNOSIS — R531 Weakness: Secondary | ICD-10-CM | POA: Diagnosis not present

## 2024-04-01 DIAGNOSIS — K219 Gastro-esophageal reflux disease without esophagitis: Secondary | ICD-10-CM | POA: Diagnosis not present

## 2024-04-01 DIAGNOSIS — E7849 Other hyperlipidemia: Secondary | ICD-10-CM | POA: Diagnosis not present

## 2024-04-01 DIAGNOSIS — I10 Essential (primary) hypertension: Secondary | ICD-10-CM | POA: Diagnosis not present

## 2024-04-01 DIAGNOSIS — E1143 Type 2 diabetes mellitus with diabetic autonomic (poly)neuropathy: Secondary | ICD-10-CM | POA: Diagnosis not present

## 2024-04-22 ENCOUNTER — Encounter (INDEPENDENT_AMBULATORY_CARE_PROVIDER_SITE_OTHER): Payer: Self-pay | Admitting: Gastroenterology
